# Patient Record
Sex: Male | Born: 1960
Health system: Southern US, Community
[De-identification: ages and names within clinical notes are randomized; demographics above are authoritative.]

## PROBLEM LIST (undated history)

## (undated) DIAGNOSIS — I10 Essential (primary) hypertension: Secondary | ICD-10-CM

## (undated) DIAGNOSIS — E669 Obesity, unspecified: Secondary | ICD-10-CM

## (undated) DIAGNOSIS — E7800 Pure hypercholesterolemia, unspecified: Secondary | ICD-10-CM

## (undated) DIAGNOSIS — I491 Atrial premature depolarization: Secondary | ICD-10-CM

## (undated) DIAGNOSIS — I4819 Other persistent atrial fibrillation: Secondary | ICD-10-CM

## (undated) HISTORY — DX: Other persistent atrial fibrillation: I48.19

## (undated) HISTORY — DX: Obesity, unspecified: E66.9

## (undated) HISTORY — DX: Atrial premature depolarization: I49.1

## (undated) HISTORY — PX: TONSILLECTOMY: SUR1361

## (undated) HISTORY — DX: Pure hypercholesterolemia, unspecified: E78.00

## (undated) HISTORY — DX: Essential (primary) hypertension: I10

---

## 1998-11-17 HISTORY — PX: SHOULDER ARTHROSCOPY: SHX128

## 2003-09-20 ENCOUNTER — Ambulatory Visit (HOSPITAL_COMMUNITY): Admission: RE | Admit: 2003-09-20 | Discharge: 2003-09-20 | Payer: Self-pay | Admitting: Orthopedic Surgery

## 2005-01-10 ENCOUNTER — Encounter: Admission: RE | Admit: 2005-01-10 | Discharge: 2005-01-31 | Payer: Self-pay | Admitting: Orthopedic Surgery

## 2005-03-05 ENCOUNTER — Inpatient Hospital Stay (HOSPITAL_COMMUNITY): Admission: EM | Admit: 2005-03-05 | Discharge: 2005-03-06 | Payer: Self-pay | Admitting: *Deleted

## 2007-02-05 ENCOUNTER — Emergency Department (HOSPITAL_COMMUNITY): Admission: EM | Admit: 2007-02-05 | Discharge: 2007-02-05 | Payer: Self-pay | Admitting: Family Medicine

## 2007-12-18 ENCOUNTER — Emergency Department (HOSPITAL_COMMUNITY): Admission: EM | Admit: 2007-12-18 | Discharge: 2007-12-18 | Payer: Self-pay | Admitting: Emergency Medicine

## 2008-04-04 ENCOUNTER — Encounter: Admission: RE | Admit: 2008-04-04 | Discharge: 2008-04-04 | Payer: Self-pay | Admitting: Chiropractic Medicine

## 2009-04-09 ENCOUNTER — Emergency Department (HOSPITAL_COMMUNITY): Admission: EM | Admit: 2009-04-09 | Discharge: 2009-04-09 | Payer: Self-pay | Admitting: Family Medicine

## 2010-11-17 HISTORY — PX: SHOULDER ARTHROSCOPY W/ ROTATOR CUFF REPAIR: SHX2400

## 2011-01-02 ENCOUNTER — Other Ambulatory Visit (HOSPITAL_COMMUNITY): Payer: Self-pay | Admitting: Orthopedic Surgery

## 2011-01-02 DIAGNOSIS — M75100 Unspecified rotator cuff tear or rupture of unspecified shoulder, not specified as traumatic: Secondary | ICD-10-CM

## 2011-01-02 DIAGNOSIS — M25512 Pain in left shoulder: Secondary | ICD-10-CM

## 2011-01-05 ENCOUNTER — Encounter (HOSPITAL_COMMUNITY): Payer: Self-pay

## 2011-01-05 ENCOUNTER — Ambulatory Visit (HOSPITAL_COMMUNITY)
Admission: RE | Admit: 2011-01-05 | Discharge: 2011-01-05 | Disposition: A | Discharge status: Home / Self Care | Payer: 59 | Source: Ambulatory Visit | Attending: Orthopedic Surgery | Admitting: Orthopedic Surgery

## 2011-01-05 DIAGNOSIS — M75100 Unspecified rotator cuff tear or rupture of unspecified shoulder, not specified as traumatic: Secondary | ICD-10-CM

## 2011-01-05 DIAGNOSIS — S46919A Strain of unspecified muscle, fascia and tendon at shoulder and upper arm level, unspecified arm, initial encounter: Secondary | ICD-10-CM | POA: Insufficient documentation

## 2011-01-05 DIAGNOSIS — M25512 Pain in left shoulder: Secondary | ICD-10-CM

## 2011-01-05 DIAGNOSIS — S46819A Strain of other muscles, fascia and tendons at shoulder and upper arm level, unspecified arm, initial encounter: Secondary | ICD-10-CM | POA: Insufficient documentation

## 2011-01-05 DIAGNOSIS — M25419 Effusion, unspecified shoulder: Secondary | ICD-10-CM | POA: Insufficient documentation

## 2011-01-05 DIAGNOSIS — X58XXXA Exposure to other specified factors, initial encounter: Secondary | ICD-10-CM | POA: Insufficient documentation

## 2011-01-08 ENCOUNTER — Other Ambulatory Visit (HOSPITAL_COMMUNITY): Payer: Self-pay | Admitting: Orthopedic Surgery

## 2011-01-08 ENCOUNTER — Ambulatory Visit (HOSPITAL_COMMUNITY)
Admission: RE | Admit: 2011-01-08 | Discharge: 2011-01-08 | Disposition: A | Discharge status: Home / Self Care | Payer: 59 | Source: Ambulatory Visit | Attending: Orthopedic Surgery | Admitting: Orthopedic Surgery

## 2011-01-08 ENCOUNTER — Encounter (HOSPITAL_COMMUNITY)
Admission: RE | Admit: 2011-01-08 | Discharge: 2011-01-08 | Disposition: A | Discharge status: Home / Self Care | Payer: 59 | Source: Ambulatory Visit | Attending: Orthopedic Surgery | Admitting: Orthopedic Surgery

## 2011-01-08 DIAGNOSIS — M25512 Pain in left shoulder: Secondary | ICD-10-CM

## 2011-01-08 DIAGNOSIS — Z01812 Encounter for preprocedural laboratory examination: Secondary | ICD-10-CM | POA: Insufficient documentation

## 2011-01-08 DIAGNOSIS — M25519 Pain in unspecified shoulder: Secondary | ICD-10-CM | POA: Insufficient documentation

## 2011-01-08 DIAGNOSIS — Z01818 Encounter for other preprocedural examination: Secondary | ICD-10-CM | POA: Insufficient documentation

## 2011-01-08 LAB — CBC
HCT: 45.3 % (ref 39.0–52.0)
Hemoglobin: 15.7 g/dL (ref 13.0–17.0)
MCH: 31.3 pg (ref 26.0–34.0)
MCHC: 34.7 g/dL (ref 30.0–36.0)
MCV: 90.2 fL (ref 78.0–100.0)
Platelets: 224 10*3/uL (ref 150–400)
RBC: 5.02 MIL/uL (ref 4.22–5.81)
RDW: 13.5 % (ref 11.5–15.5)
WBC: 5.3 10*3/uL (ref 4.0–10.5)

## 2011-01-08 LAB — COMPREHENSIVE METABOLIC PANEL
ALT: 31 U/L (ref 0–53)
AST: 25 U/L (ref 0–37)
Albumin: 4.2 g/dL (ref 3.5–5.2)
Alkaline Phosphatase: 39 U/L (ref 39–117)
BUN: 12 mg/dL (ref 6–23)
CO2: 27 mEq/L (ref 19–32)
Calcium: 9.4 mg/dL (ref 8.4–10.5)
Chloride: 102 mEq/L (ref 96–112)
Creatinine, Ser: 1.01 mg/dL (ref 0.4–1.5)
GFR calc Af Amer: >60 mL/min (ref >60)
GFR calc non Af Amer: >60 mL/min (ref >60)
Glucose, Bld: 107 mg/dL — ABNORMAL HIGH (ref 70–99)
Potassium: 4.7 mEq/L (ref 3.5–5.1)
Sodium: 138 mEq/L (ref 135–145)
Total Bilirubin: 0.7 mg/dL (ref 0.3–1.2)
Total Protein: 7.1 g/dL (ref 6.0–8.3)

## 2011-01-08 LAB — SURGICAL PCR SCREEN
MRSA, PCR: POSITIVE — AB
Staphylococcus aureus: POSITIVE — AB

## 2011-01-08 LAB — DIFFERENTIAL
Basophils Absolute: 0 10*3/uL (ref 0.0–0.1)
Basophils Relative: 1 % (ref 0–1)
Eosinophils Absolute: 0.2 10*3/uL (ref 0.0–0.7)
Eosinophils Relative: 4 % (ref 0–5)
Lymphocytes Relative: 28 % (ref 12–46)
Lymphs Abs: 1.5 10*3/uL (ref 0.7–4.0)
Monocytes Absolute: 0.5 10*3/uL (ref 0.1–1.0)
Monocytes Relative: 9 % (ref 3–12)
Neutro Abs: 3.1 10*3/uL (ref 1.7–7.7)
Neutrophils Relative %: 58 % (ref 43–77)

## 2011-01-08 LAB — URINALYSIS, ROUTINE W REFLEX MICROSCOPIC
Bilirubin Urine: NEGATIVE
Hgb urine dipstick: NEGATIVE
Ketones, ur: NEGATIVE mg/dL
Nitrite: NEGATIVE
Protein, ur: NEGATIVE mg/dL
Specific Gravity, Urine: 1.021 (ref 1.005–1.030)
Urine Glucose, Fasting: NEGATIVE mg/dL
Urobilinogen, UA: 0.2 mg/dL (ref 0.0–1.0)
pH: 8 (ref 5.0–8.0)

## 2011-01-08 LAB — PROTIME-INR
INR: 0.94 (ref 0.00–1.49)
Prothrombin Time: 12.8 seconds (ref 11.6–15.2)

## 2011-01-08 LAB — APTT: aPTT: 30 seconds (ref 24–37)

## 2011-01-10 ENCOUNTER — Ambulatory Visit (HOSPITAL_COMMUNITY)
Admission: RE | Admit: 2011-01-10 | Discharge: 2011-01-10 | Disposition: A | Discharge status: Home / Self Care | Payer: 59 | Source: Ambulatory Visit | Attending: Orthopedic Surgery | Admitting: Orthopedic Surgery

## 2011-01-10 DIAGNOSIS — Z7982 Long term (current) use of aspirin: Secondary | ICD-10-CM | POA: Insufficient documentation

## 2011-01-10 DIAGNOSIS — I4891 Unspecified atrial fibrillation: Secondary | ICD-10-CM | POA: Insufficient documentation

## 2011-01-10 DIAGNOSIS — S43429A Sprain of unspecified rotator cuff capsule, initial encounter: Secondary | ICD-10-CM | POA: Insufficient documentation

## 2011-01-10 DIAGNOSIS — M129 Arthropathy, unspecified: Secondary | ICD-10-CM | POA: Insufficient documentation

## 2011-01-10 DIAGNOSIS — X58XXXA Exposure to other specified factors, initial encounter: Secondary | ICD-10-CM | POA: Insufficient documentation

## 2011-01-20 NOTE — Op Note (Signed)
  NAME:  Terry Kim, READ NO.:  [id number]  MEDICAL RECORD NO.:  [id number]           PATIENT TYPE:  O  LOCATION:  [facility]SC                         FACILITY:  [facility]  PHYSICIAN:  Dyke Brackett, M.D.    DATE OF BIRTH:  December 14, 1960  DATE OF PROCEDURE:  01/10/2011 DATE OF DISCHARGE:                              OPERATIVE REPORT   INDICATIONS:  This is a 50 year old with MRI proven cuff tear, thought to be amenable to outpatient surgery.  PREOPERATIVE DIAGNOSES: 1. Complete rotator cuff tear supraspinatus. 2. A 30-40% biceps tendon tear, attachment of labrum. 3. Torn anterior superior labrum.  POSTOPERATIVE DIAGNOSES: 1. Complete rotator cuff tear supraspinatus. 2. A 30-40% biceps tendon tear, attachment of labrum. 3. Torn superior labrum.  OPERATION: 1. Open rotator cuff repair acromioplasty. 2. Open distal clavicle. 3. Arthroscopic debridement of biceps and torn labrum.  SURGEON:  Dyke Brackett, MD  ASSISTANT:  Laural Benes. Jannet Mantis.  All at the left shoulder.  On exam under anesthesia, there was no instability, normal range of motion passively.  Arthroscope was placed through anterior portals. Systematic inspection of the shoulder showed the patient to have tearing of the flaps of labrum which were debrided.  There was no instability noted.  There was a 30-40% attached tear right at the attachment of biceps tendon.  My feeling that this was an acute injury and that there was still probably 60-70% of the biceps attachment intact, we had elected to debride this.  There was a complete supraspinatus tear with mild retraction.  We identified that and decided to do an open procedure.  The procedure was made bisecting the [facility] acromial interval inclined under distal clavicle, moderate degenerative in nature which was excised over about a centimeter and centimeter and half.  Very thickened acromion within the acromioplasty revealing the cuff tear with fair amount  of bursal hypertrophy.  Bursectomy was carried out.  Edges of the tear were freshened with a 15 blade.  We brought the superior surface of the tuberosity.  I then placed two 5.5 Bio-Corkscrew anchors, a total of 4 sutures, oversewed this with #2 FiberWire.  This created essentially a watertight repair, good healthy tissue was obtained.  We oversewed the edge as mentioned, avoided base that we pulled off near, but not quite at the attachment site.  Thus, we reestablished the footprint nicely.  We then placed one PushLock laterally to imbricate the suture knots and imbricate more soft tissue on the lateral aspect of the tuberosity.  Copious irrigation was carried out.  Closure of the deltoid with #2 Ethibond, the subcutaneous tissues with 2-0 Vicryl, and the portals and skin with 3-0 Monocryl.  Light compressive sterile dressing, Benzoin and Steri-Strips applied and a sling, taken to the recovery room in stable condition.     Dyke Brackett, M.D.     WDC/MEDQ  D:  01/10/2011  T:  01/11/2011  Job:  [id number]  Electronically Signed by W. Shiloh Southern M.D. on 01/20/2011 01:48:57 PM

## 2011-04-04 NOTE — H&P (Signed)
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  According to his wife, he has not been feeling well recently.  He  has had a low-grade fever, mild frontal headache and pharyngitis.  Last  night, before he went to sleep, he took Benadryl and Mucinex in order to  help with his sinuses.  This morning, when he woke up, he generally did not  feel very good.  He had decreased appetite and still had a mild frontal  headache.  He sat on the side of the bed for awhile and then apparently  spontaneously lost consciousness and fell to the ground. His wife heard a  bang, presumably when he fell to the ground.  Before she could check on him,  he regained consciousness and called out to her.  She came into the room,  and at that point he was awake and alert.  She told him to sit down on the  side of the bed.  He did that, and then apparently looked rather pale and  diaphoretic and then fell forward again with loss of consciousness.  This  lasted for perhaps 45 seconds.  During this period of time, he had loud  snoring respirations.  There was no loss of bowel or bladder control.  There  was no seizure activity.  There was no apparent respiratory distress.  He  then woke up and seemed to be completely alert and awake after this  happened.  EMS was called, and the patient was transported to the emergency  room.  On arrival to the emergency room, his blood pressure was noted to be  107/73, pulse 77.  He was awake and alert, and his only complaint was the  above mentioned dull headache.  Electrocardiogram was obtained which  was  normal.  He is admitted at this time for further evaluation of these  recurrent syncopal episodes.  Of note is that in 2002, he was found to be in  atrial fibrillation.  EKG was done at [facility], and  he was put on metoprolol at that time.  An echocardiogram and stress test  were done by Dr. Armanda Kim and were reportedly normal.  He last saw Dr.  Mayford Knife in January who has been following him on a yearly basis.   MEDICATIONS:  1.  Metoprolol 50 mg daily.  2.  Lipitor 40 mg daily.  3.  Aspirin 81 mg daily.   ALLERGIES:  PENICILLIN.   OPERATIONS:  He has had a surgery on his right shoulder AC joint   PAST MEDICAL HISTORY:  1.  Hyperlipidemia.  2.  Mild hypertension.  3.  See above.   FAMILY HISTORY:  Father has history of allergies.  Mother has hypertension.  Siblings are in good health.   SOCIAL HISTORY:  He does not smoke, abuse alcohol or drugs.  He is married  to his wife who works at [employer].   REVIEW OF SYSTEMS:  Head:  He has a dull frontal headache.  Ear, Nose,  Throat:  He has mild pharyngitis.  Chest:  Denies coughing, wheezing or  chest congestion.  Cardiovascular:  Denies chest pain, orthopnea or PND.  GI:  Denies nausea, vomiting, abdominal pain, change in bowel habits, melena  or hematochezia.  GU:  Denies dysuria or urinary frequency.  Neurological:  No history of seizure or stroke.  Endocrine:  Denies excessive thirst,  urinary frequency or nocturia.   PHYSICAL EXAMINATION:  VITAL SIGNS:  Blood pressure 107/73, pulse 81,  respirations 20.  HEENT:  Within normal limits.  CHEST:  Clear.  CARDIOVASCULAR:  Normal S1, S2 without murmurs, rubs or gallops.  ABDOMEN:  Benign.  No bowel sounds without masses, tenderness or  organomegaly.  NEUROLOGICAL:  Cranial nerves, motor and sensory and cerebellar testing is  normal.  Station and gait were not tested.   STUDIES:  CT scan of the brain was obtained which was negative.  CT scan  of  the cervical spine was also done because of his head injury, and this showed  no acute changes.   LABORATORY DATA:  Unremarkable to date.   IMPRESSION:  1.  Recurrent syncope.  2.  History of headache.  3.  Hyperlipidemia.  4.  History of rapid atrial fibrillation.  5.  History of hypertension.   PLAN:  1.  Will admit him to telemetry.  2.  Per discussion with the family and at their request, will obtain an MRI      scan of the brain.  3.  I consulted Dr. Armanda Kim for further advise about whether additional      cardiac workup is indicated.  Dr. Mayford Knife is familiar with the patient,      having seen him in the past.      SY/MEDQ  D:  03/05/2005  T:  03/05/2005  Job:  [id number]   cc:   Terry Kim, M.D.  [address]  [place]  [place] [address]  Fax: [phone]   Terry Kim, M.D.  [address]  [place], [place] [address]  Fax: [phone]

## 2011-04-04 NOTE — Discharge Summary (Signed)
NAMEMAYCO, WALROND NO.:  [id number]   MEDICAL RECORD NO.:  [id number]          PATIENT TYPE:  INP   LOCATION:  2009                         FACILITY:  [facility]   PHYSICIAN:  Sherin Quarry, MD      DATE OF BIRTH:  [birth year]   DATE OF ADMISSION:  03/05/2005  DATE OF DISCHARGE:  03/06/2005                                 DISCHARGE SUMMARY   Terry Kim is a 50 year old man who presented on March 05, 2005 with a  history of recurrent syncope.  Essentially this gentleman had been  experiencing an illness characterized by low grade fever, malaise,  diaphoresis and pharyngitis over approximately 48 hours prior to  presentation.  On the morning of presentation he awakened and sat on the  side of the bed, he indicated he did not feel well and had a mild frontal  headache.  He then spontaneously passed out and fell on the floor.  His wife  heard the noise of him falling, but before she checked on him, he awakened  and once again sat on the side of the bed.  When she came in and saw him he  seemed pale, he then reported feeling dizzy and once again passed out  falling forward again, hitting his head.  His wife observed him to have  snoring respirations that lasted for about 45 seconds.  She did not witness  any seizure activity, there was no incontinence.  He did not appear to be  having any respiratory distress.  When he revived, he was completely alert.  He was therefore transported to the emergency room.  In the emergency room  his physical examination showed a blood pressure of 107/73, pulse 81,  respirations 20.  HEENT exam was within normal limits.  The chest was clear.  Cardiovascular exam revealed normal S1 and S2, without rubs, murmurs or  gallops.  The abdomen was normal.  On neurologic testing, cranial nerves,  motor, sensory and cerebellar testing was normal.  Station and gait was not  tested.  Relevant studies obtained included a CT scan done without contrast  which showed normal brain with no evidence of hemorrhage or stroke.  Because  of the patients history of recurrent head injury from falling, he also had a  CT scan of the cervical spine, this also showed no significant  abnormalities.  After discussion with the family a decision was made to  proceed with MRI scan of the brain, this was a completely normal study per  the radiologist.  As the patient had seen Dr. Armanda Magic in the past for  management of an episode of paroxysmal atrial fibrillation, I asked Dr.  Mayford Knife to reevaluate him.  It was Dr.  Norris Cross impression that this episode  of syncope was most likely secondary to dehydration and orthostatic  hypotension and probably did not represent a cardiac arrhythmia.  She  recommended that the patient undergo further testing to include a 2D  echocardiogram which she reviewed and indicated was a normal study.  She  also recommended carotid studies be performed.  These showed  no evidence of  plaque or ICA stenosis.  Vertebral artery flow was antegrade.  Laboratory  studies obtained included electrolytes, blood sugar, renal function and  urinalysis which were normal.  The patient was observed during the course of  this hospitalization on telemetry, and no arrhythmias were detected.  Orthostatic blood pressure was monitored and there was no abnormality  detected.  Therefore on March 06, 2005 the patient was discharged.   DISCHARGE DIAGNOSES:  1.  Syncope probably secondary to orthostatic hypotension related to      dehydration.  2.  Mild headache secondary to viral illness and trauma from blunt trauma to      head.  3.  History of hyperlipidemia.  4.  History of paroxysmal atrial fibrillation.  5.  History of hypertension.   On discharge the patient will be advised to continue his usual medications,  these consist of:  1.  Metoprolol 50 mg daily.  2.  Lipitor 40 mg daily.  3.  Aspirin 81 mg daily.   The patient will follow up with  Dr. Tenny Craw of the [facility] and with Dr. Armanda Magic of [facility].   CONDITION ON DISCHARGE:  Good.      SY/MEDQ  D:  03/06/2005  T:  03/06/2005  Job:  [id number]   cc:   Audree Camel, MD  [facility]   Armanda Magic, M.D.  [address]  [place], [place] [address]  Fax: [phone]

## 2012-04-15 ENCOUNTER — Ambulatory Visit: Payer: 59 | Attending: Family Medicine | Admitting: Physical Therapy

## 2012-04-15 DIAGNOSIS — M25659 Stiffness of unspecified hip, not elsewhere classified: Secondary | ICD-10-CM | POA: Insufficient documentation

## 2012-04-15 DIAGNOSIS — IMO0001 Reserved for inherently not codable concepts without codable children: Secondary | ICD-10-CM | POA: Insufficient documentation

## 2012-04-15 DIAGNOSIS — M25559 Pain in unspecified hip: Secondary | ICD-10-CM | POA: Insufficient documentation

## 2012-04-19 ENCOUNTER — Ambulatory Visit: Payer: 59 | Attending: Family Medicine

## 2012-04-19 DIAGNOSIS — M25659 Stiffness of unspecified hip, not elsewhere classified: Secondary | ICD-10-CM | POA: Insufficient documentation

## 2012-04-19 DIAGNOSIS — M25559 Pain in unspecified hip: Secondary | ICD-10-CM | POA: Insufficient documentation

## 2012-04-19 DIAGNOSIS — IMO0001 Reserved for inherently not codable concepts without codable children: Secondary | ICD-10-CM | POA: Insufficient documentation

## 2012-04-22 ENCOUNTER — Encounter: Payer: 59 | Admitting: Physical Therapy

## 2012-04-27 ENCOUNTER — Ambulatory Visit: Payer: 59 | Admitting: Physical Therapy

## 2012-04-30 ENCOUNTER — Ambulatory Visit: Payer: 59 | Admitting: Physical Therapy

## 2012-05-04 ENCOUNTER — Ambulatory Visit: Payer: 59 | Admitting: Physical Therapy

## 2012-05-11 ENCOUNTER — Encounter: Payer: 59 | Admitting: Physical Therapy

## 2012-05-25 ENCOUNTER — Ambulatory Visit: Payer: 59 | Attending: Family Medicine | Admitting: Physical Therapy

## 2012-05-25 DIAGNOSIS — M25559 Pain in unspecified hip: Secondary | ICD-10-CM | POA: Insufficient documentation

## 2012-05-25 DIAGNOSIS — IMO0001 Reserved for inherently not codable concepts without codable children: Secondary | ICD-10-CM | POA: Insufficient documentation

## 2012-05-25 DIAGNOSIS — M25659 Stiffness of unspecified hip, not elsewhere classified: Secondary | ICD-10-CM | POA: Insufficient documentation

## 2013-10-26 ENCOUNTER — Other Ambulatory Visit: Payer: Self-pay | Admitting: Cardiology

## 2014-04-03 ENCOUNTER — Other Ambulatory Visit: Payer: Self-pay | Admitting: General Surgery

## 2014-04-03 ENCOUNTER — Telehealth: Payer: Self-pay | Admitting: Cardiology

## 2014-04-03 ENCOUNTER — Encounter: Payer: Self-pay | Admitting: Cardiology

## 2014-04-03 DIAGNOSIS — E7800 Pure hypercholesterolemia, unspecified: Secondary | ICD-10-CM

## 2014-04-03 NOTE — Telephone Encounter (Signed)
Figured it out. Labs put in system.

## 2014-04-03 NOTE — Telephone Encounter (Signed)
What did he want for labs?

## 2014-04-03 NOTE — Telephone Encounter (Signed)
New Message  Made OV for labs per Pt requests. Please put in orders.

## 2014-04-19 ENCOUNTER — Ambulatory Visit: Payer: 59 | Admitting: Cardiology

## 2014-05-01 ENCOUNTER — Other Ambulatory Visit (INDEPENDENT_AMBULATORY_CARE_PROVIDER_SITE_OTHER): Payer: 59

## 2014-05-01 ENCOUNTER — Telehealth: Payer: Self-pay | Admitting: Cardiology

## 2014-05-01 ENCOUNTER — Other Ambulatory Visit: Payer: 59

## 2014-05-01 DIAGNOSIS — E7800 Pure hypercholesterolemia, unspecified: Secondary | ICD-10-CM

## 2014-05-01 LAB — LIPID PANEL
Cholesterol: 168 mg/dL (ref 0–200)
HDL: 45.2 mg/dL (ref >39.00)
LDL Cholesterol: 99 mg/dL (ref 0–99)
NonHDL: 122.8
Total CHOL/HDL Ratio: 4
Triglycerides: 117 mg/dL (ref 0.0–149.0)
VLDL: 23.4 mg/dL (ref 0.0–40.0)

## 2014-05-01 LAB — HEPATIC FUNCTION PANEL
ALT: 32 U/L (ref 0–53)
AST: 24 U/L (ref 0–37)
Albumin: 4.4 g/dL (ref 3.5–5.2)
Alkaline Phosphatase: 33 U/L — ABNORMAL LOW (ref 39–117)
Bilirubin, Direct: 0.1 mg/dL (ref 0.0–0.3)
Total Bilirubin: 0.8 mg/dL (ref 0.2–1.2)
Total Protein: 7.2 g/dL (ref 6.0–8.3)

## 2014-05-01 NOTE — Telephone Encounter (Signed)
Please review patient's lipids in epic

## 2014-05-01 NOTE — Telephone Encounter (Signed)
LDL goal < 130. Meds: Simvastatin 20 mg qhs, fish oil. Both LDL and non-HDL well controlled (LDL 99, non-HDL 123) LFTs okay. Plan: 1. No change in meds. 2. Recheck lipid panel and hepatic panel in 1 year. Please notify patient, and set up lab. thanks.

## 2014-05-02 ENCOUNTER — Other Ambulatory Visit: Payer: Self-pay | Admitting: General Surgery

## 2014-05-02 DIAGNOSIS — E7800 Pure hypercholesterolemia, unspecified: Secondary | ICD-10-CM

## 2014-05-02 NOTE — Telephone Encounter (Signed)
LEtter sent to pt with lab report as well to make aware. Labs ordered for one year out.

## 2014-05-15 ENCOUNTER — Ambulatory Visit: Payer: 59 | Admitting: Cardiology

## 2014-06-12 ENCOUNTER — Other Ambulatory Visit: Payer: Self-pay | Admitting: Cardiology

## 2014-06-13 NOTE — Telephone Encounter (Signed)
Pt must keep Appt in September in order to have any more refills. Has cancelled two appts since June

## 2014-06-19 ENCOUNTER — Other Ambulatory Visit (HOSPITAL_COMMUNITY): Payer: Self-pay | Admitting: Orthopedic Surgery

## 2014-06-19 DIAGNOSIS — M25562 Pain in left knee: Secondary | ICD-10-CM

## 2014-06-27 ENCOUNTER — Ambulatory Visit (HOSPITAL_COMMUNITY)
Admission: RE | Admit: 2014-06-27 | Discharge: 2014-06-27 | Disposition: A | Discharge status: Home / Self Care | Payer: 59 | Source: Ambulatory Visit | Attending: Orthopedic Surgery | Admitting: Orthopedic Surgery

## 2014-06-27 DIAGNOSIS — M25562 Pain in left knee: Secondary | ICD-10-CM

## 2014-06-27 DIAGNOSIS — M25569 Pain in unspecified knee: Secondary | ICD-10-CM | POA: Insufficient documentation

## 2014-06-30 ENCOUNTER — Encounter (HOSPITAL_BASED_OUTPATIENT_CLINIC_OR_DEPARTMENT_OTHER): Payer: Self-pay | Admitting: *Deleted

## 2014-06-30 NOTE — Progress Notes (Signed)
To come in for ekg-bmet-seeing dr turner 8/18 for clearance- Works IV team [facility]

## 2014-06-30 NOTE — Progress Notes (Signed)
06/30/14 1047  OBSTRUCTIVE SLEEP APNEA  Have you ever been diagnosed with sleep apnea through a sleep study? No  Do you snore loudly (loud enough to be heard through closed doors)?  1  Do you often feel tired, fatigued, or sleepy during the daytime? 0  Has anyone observed you stop breathing during your sleep? 0  Do you have, or are you being treated for high blood pressure? 1  BMI more than 35 kg/m2? 1  Age over 52 years old? 1  Neck circumference greater than 40 cm/16 inches? 1  Gender: 1  Obstructive Sleep Apnea Score 6  Score 4 or greater  Results sent to PCP

## 2014-07-03 ENCOUNTER — Encounter (HOSPITAL_BASED_OUTPATIENT_CLINIC_OR_DEPARTMENT_OTHER)
Admission: RE | Admit: 2014-07-03 | Discharge: 2014-07-03 | Disposition: A | Discharge status: Home / Self Care | Payer: 59 | Source: Ambulatory Visit | Attending: Orthopedic Surgery | Admitting: Orthopedic Surgery

## 2014-07-03 DIAGNOSIS — Z79899 Other long term (current) drug therapy: Secondary | ICD-10-CM | POA: Diagnosis not present

## 2014-07-03 DIAGNOSIS — I10 Essential (primary) hypertension: Secondary | ICD-10-CM | POA: Diagnosis not present

## 2014-07-03 DIAGNOSIS — E7800 Pure hypercholesterolemia, unspecified: Secondary | ICD-10-CM | POA: Diagnosis not present

## 2014-07-03 DIAGNOSIS — I4891 Unspecified atrial fibrillation: Secondary | ICD-10-CM | POA: Diagnosis not present

## 2014-07-03 DIAGNOSIS — M1710 Unilateral primary osteoarthritis, unspecified knee: Secondary | ICD-10-CM | POA: Diagnosis not present

## 2014-07-03 DIAGNOSIS — Z7982 Long term (current) use of aspirin: Secondary | ICD-10-CM | POA: Diagnosis not present

## 2014-07-03 DIAGNOSIS — IMO0002 Reserved for concepts with insufficient information to code with codable children: Secondary | ICD-10-CM | POA: Diagnosis present

## 2014-07-03 DIAGNOSIS — X58XXXA Exposure to other specified factors, initial encounter: Secondary | ICD-10-CM | POA: Diagnosis not present

## 2014-07-03 LAB — BASIC METABOLIC PANEL
ANION GAP: 11 (ref 5–15)
BUN: 13 mg/dL (ref 6–23)
CALCIUM: 9.4 mg/dL (ref 8.4–10.5)
CHLORIDE: 103 meq/L (ref 96–112)
CO2: 28 mEq/L (ref 19–32)
CREATININE: 0.9 mg/dL (ref 0.50–1.35)
GFR calc non Af Amer: >90 mL/min (ref >90)
Glucose, Bld: 122 mg/dL — ABNORMAL HIGH (ref 70–99)
Potassium: 4.2 mEq/L (ref 3.7–5.3)
Sodium: 142 mEq/L (ref 137–147)

## 2014-07-04 ENCOUNTER — Ambulatory Visit (INDEPENDENT_AMBULATORY_CARE_PROVIDER_SITE_OTHER): Payer: 59 | Admitting: Cardiology

## 2014-07-04 ENCOUNTER — Encounter: Payer: Self-pay | Admitting: Cardiology

## 2014-07-04 VITALS — BP 146/95 | HR 84 | Ht 66.0 in | Wt 235.0 lb

## 2014-07-04 DIAGNOSIS — E7800 Pure hypercholesterolemia, unspecified: Secondary | ICD-10-CM

## 2014-07-04 DIAGNOSIS — I10 Essential (primary) hypertension: Secondary | ICD-10-CM | POA: Insufficient documentation

## 2014-07-04 DIAGNOSIS — I4891 Unspecified atrial fibrillation: Secondary | ICD-10-CM

## 2014-07-04 DIAGNOSIS — I480 Paroxysmal atrial fibrillation: Secondary | ICD-10-CM

## 2014-07-04 DIAGNOSIS — E669 Obesity, unspecified: Secondary | ICD-10-CM

## 2014-07-04 DIAGNOSIS — I4819 Other persistent atrial fibrillation: Secondary | ICD-10-CM | POA: Insufficient documentation

## 2014-07-04 NOTE — Patient Instructions (Signed)
Your physician recommends that you continue on your current medications as directed. Please refer to the Current Medication list given to you today.  Dr Radford Pax has Cleared you for Surgery tomorrow 07/05/14  Your physician wants you to follow-up in: 1 year with Dr Mallie Snooks will receive a reminder letter in the mail two months in advance. If you don't receive a letter, please call our office to schedule the follow-up appointment.

## 2014-07-04 NOTE — Progress Notes (Signed)
  [address], [place], [place]  [address] Phone: [phone] Fax:  [phone]  Date:  07/04/2014   ID:  Terry Kim, DOB 1961/02/09, MRN [id number]  PCP:  Melinda Crutch  Cardiologist:  Fransico Him, MD     History of Present Illness: Terry Kim is a 53 y.o. male with a history of PAF, HTN, dyslipidemia and obesity who presents today for followup.  He is doing well.  He denies any chest pain, SOB, DOE, LE edema, dizziness, palpitations or syncope. He recently injured his knee and his going to have arthroscopic knee surgery and needs preop clearance.     Wt Readings from Last 3 Encounters:  07/04/14 235 lb (106.595 kg)  06/30/14 230 lb (104.327 kg)     Past Medical History  Diagnosis Date  . Obesity   . Paroxysmal a-fib   . Supraventricular premature beats   . Pure hypercholesterolemia   . Hypertension     Current Outpatient Prescriptions  Medication Sig Dispense Refill  . metoprolol succinate (TOPROL-XL) 50 MG 24 hr tablet Take 75 mg by mouth daily. Take with or immediately following a meal.      . Multiple Vitamin (MULTI VITAMIN DAILY PO) Take by mouth.      . simvastatin (ZOCOR) 20 MG tablet TAKE 1 TABLET BY MOUTH DAILY  30 tablet  0  . aspirin 325 MG tablet Take 325 mg by mouth daily.      Marland Kitchen ibuprofen (ADVIL,MOTRIN) 200 MG tablet Take 200 mg by mouth every 6 (six) hours as needed.       No current facility-administered medications for this visit.    Allergies:    Allergies  Allergen Reactions  . Penicillins Hives  . Skelaxin [Metaxalone] Itching    Social History:  The patient  reports that he has never smoked. He does not have any smokeless tobacco history on file. He reports that he drinks alcohol. He reports that he does not use illicit drugs.   Family History:  The patient's family history is not on file.   ROS:  Please see the history of present illness.      All other systems reviewed and negative.   PHYSICAL EXAM: VS:  BP 146/95  Pulse  84  Ht 5\' 6"  (1.676 m)  Wt 235 lb (106.595 kg)  BMI 37.95 kg/m2 Well nourished, well developed, in no acute distress HEENT: normal Neck: no JVD Cardiac:  normal S1, S2; RRR; no murmur Lungs:  clear to auscultation bilaterally, no wheezing, rhonchi or rales Abd: soft, nontender, no hepatomegaly Ext: no edema Skin: warm and dry Neuro:  CNs 2-12 intact, no focal abnormalities noted  EKG:     NSR with no ST changes  ASSESSMENT AND PLAN:  1. PAF maintaining NSR - continue ASA/BB 2. HTN slightly elevated today but at home is usually 115/80-mmHg - continue Toprol 3. Obesity 4. Dyslipidemia - LDL at goal at 99 - continue statin  Followup with me in 1 year  Signed, Fransico Him, MD 07/04/2014 8:49 AM

## 2014-07-05 ENCOUNTER — Ambulatory Visit: Payer: Self-pay | Admitting: Physician Assistant

## 2014-07-05 ENCOUNTER — Ambulatory Visit (HOSPITAL_BASED_OUTPATIENT_CLINIC_OR_DEPARTMENT_OTHER)
Admission: RE | Admit: 2014-07-05 | Discharge: 2014-07-05 | Disposition: A | Discharge status: Home / Self Care | Payer: 59 | Source: Ambulatory Visit | Attending: Orthopedic Surgery | Admitting: Orthopedic Surgery

## 2014-07-05 ENCOUNTER — Ambulatory Visit (HOSPITAL_BASED_OUTPATIENT_CLINIC_OR_DEPARTMENT_OTHER): Payer: 59 | Admitting: Certified Registered"

## 2014-07-05 ENCOUNTER — Telehealth: Payer: Self-pay | Admitting: Cardiology

## 2014-07-05 ENCOUNTER — Encounter (HOSPITAL_BASED_OUTPATIENT_CLINIC_OR_DEPARTMENT_OTHER)
Admission: RE | Disposition: A | Discharge status: Home / Self Care | Payer: Self-pay | Source: Ambulatory Visit | Attending: Orthopedic Surgery

## 2014-07-05 ENCOUNTER — Encounter (HOSPITAL_BASED_OUTPATIENT_CLINIC_OR_DEPARTMENT_OTHER): Payer: Self-pay | Admitting: *Deleted

## 2014-07-05 ENCOUNTER — Encounter (HOSPITAL_BASED_OUTPATIENT_CLINIC_OR_DEPARTMENT_OTHER): Payer: 59 | Admitting: Certified Registered"

## 2014-07-05 ENCOUNTER — Ambulatory Visit (HOSPITAL_COMMUNITY): Payer: 59

## 2014-07-05 DIAGNOSIS — E7800 Pure hypercholesterolemia, unspecified: Secondary | ICD-10-CM | POA: Insufficient documentation

## 2014-07-05 DIAGNOSIS — IMO0002 Reserved for concepts with insufficient information to code with codable children: Secondary | ICD-10-CM | POA: Insufficient documentation

## 2014-07-05 DIAGNOSIS — I10 Essential (primary) hypertension: Secondary | ICD-10-CM | POA: Insufficient documentation

## 2014-07-05 DIAGNOSIS — Z79899 Other long term (current) drug therapy: Secondary | ICD-10-CM | POA: Insufficient documentation

## 2014-07-05 DIAGNOSIS — Z7982 Long term (current) use of aspirin: Secondary | ICD-10-CM | POA: Insufficient documentation

## 2014-07-05 DIAGNOSIS — I4891 Unspecified atrial fibrillation: Secondary | ICD-10-CM | POA: Insufficient documentation

## 2014-07-05 DIAGNOSIS — M1710 Unilateral primary osteoarthritis, unspecified knee: Secondary | ICD-10-CM | POA: Insufficient documentation

## 2014-07-05 DIAGNOSIS — X58XXXA Exposure to other specified factors, initial encounter: Secondary | ICD-10-CM | POA: Insufficient documentation

## 2014-07-05 HISTORY — PX: KNEE ARTHROSCOPY WITH MEDIAL MENISECTOMY: SHX5651

## 2014-07-05 SURGERY — ARTHROSCOPY, KNEE, WITH MEDIAL MENISCECTOMY
Anesthesia: General | Site: Knee | Laterality: Left

## 2014-07-05 MED ORDER — HYDROMORPHONE HCL PF 1 MG/ML IJ SOLN: 0.5000 mg | INTRAMUSCULAR | Status: DC | PRN

## 2014-07-05 MED ORDER — FENTANYL CITRATE 0.05 MG/ML IJ SOLN
INTRAMUSCULAR | Status: AC
  Filled 2014-07-05: qty 6

## 2014-07-05 MED ORDER — HYDROCODONE-ACETAMINOPHEN 7.5-325 MG PO TABS: 1.0000 | ORAL_TABLET | ORAL | Status: DC | PRN

## 2014-07-05 MED ORDER — SODIUM CHLORIDE 0.9 % IV SOLN
INTRAVENOUS | Status: DC
Start: 2014-07-05 — End: 2014-07-05

## 2014-07-05 MED ORDER — CLINDAMYCIN PHOSPHATE 900 MG/50ML IV SOLN
900.0000 mg | INTRAVENOUS | Status: AC
  Administered 2014-07-05: 900 mg via INTRAVENOUS

## 2014-07-05 MED ORDER — FENTANYL CITRATE 0.05 MG/ML IJ SOLN: 50.0000 ug | INTRAMUSCULAR | Status: DC | PRN

## 2014-07-05 MED ORDER — MIDAZOLAM HCL 2 MG/2ML IJ SOLN: 1.0000 mg | INTRAMUSCULAR | Status: DC | PRN

## 2014-07-05 MED ORDER — OXYCODONE HCL 5 MG PO TABS: 5.0000 mg | ORAL_TABLET | Freq: Once | ORAL | Status: DC | PRN

## 2014-07-05 MED ORDER — BUPIVACAINE-EPINEPHRINE 0.5% -1:200000 IJ SOLN
INTRAMUSCULAR | Status: DC | PRN
  Administered 2014-07-05: 30 mL

## 2014-07-05 MED ORDER — HYDROMORPHONE HCL PF 1 MG/ML IJ SOLN
INTRAMUSCULAR | Status: AC
  Filled 2014-07-05: qty 1

## 2014-07-05 MED ORDER — LACTATED RINGERS IV SOLN
INTRAVENOUS | Status: DC
  Administered 2014-07-05 (×2): via INTRAVENOUS

## 2014-07-05 MED ORDER — DEXAMETHASONE SODIUM PHOSPHATE 4 MG/ML IJ SOLN
INTRAMUSCULAR | Status: DC | PRN
  Administered 2014-07-05: 10 mg via INTRAVENOUS

## 2014-07-05 MED ORDER — SODIUM CHLORIDE 0.9 % IR SOLN
Status: DC | PRN
  Administered 2014-07-05: 6000 mL

## 2014-07-05 MED ORDER — SODIUM CHLORIDE 0.9 % IV SOLN: INTRAVENOUS | Status: DC

## 2014-07-05 MED ORDER — CLINDAMYCIN PHOSPHATE 900 MG/50ML IV SOLN
INTRAVENOUS | Status: AC
  Filled 2014-07-05: qty 50

## 2014-07-05 MED ORDER — LIDOCAINE HCL (CARDIAC) 20 MG/ML IV SOLN
INTRAVENOUS | Status: DC | PRN
  Administered 2014-07-05: 50 mg via INTRAVENOUS

## 2014-07-05 MED ORDER — KETOROLAC TROMETHAMINE 30 MG/ML IJ SOLN
INTRAMUSCULAR | Status: DC | PRN
  Administered 2014-07-05: 30 mg via INTRAVENOUS

## 2014-07-05 MED ORDER — CHLORHEXIDINE GLUCONATE 4 % EX LIQD: 60.0000 mL | Freq: Once | CUTANEOUS | Status: DC

## 2014-07-05 MED ORDER — HYDROMORPHONE HCL PF 1 MG/ML IJ SOLN
0.2500 mg | INTRAMUSCULAR | Status: DC | PRN
  Administered 2014-07-05 (×2): 0.5 mg via INTRAVENOUS

## 2014-07-05 MED ORDER — METOCLOPRAMIDE HCL 5 MG/ML IJ SOLN
5.0000 mg | Freq: Three times a day (TID) | INTRAMUSCULAR | Status: DC | PRN
Start: 2014-07-05 — End: 2014-07-05

## 2014-07-05 MED ORDER — ONDANSETRON HCL 4 MG/2ML IJ SOLN
4.0000 mg | Freq: Four times a day (QID) | INTRAMUSCULAR | Status: DC | PRN
Start: 2014-07-05 — End: 2014-07-05

## 2014-07-05 MED ORDER — FENTANYL CITRATE 0.05 MG/ML IJ SOLN
INTRAMUSCULAR | Status: DC | PRN
  Administered 2014-07-05: 100 ug via INTRAVENOUS

## 2014-07-05 MED ORDER — MIDAZOLAM HCL 2 MG/2ML IJ SOLN
INTRAMUSCULAR | Status: AC
  Filled 2014-07-05: qty 2

## 2014-07-05 MED ORDER — ONDANSETRON HCL 4 MG PO TABS
4.0000 mg | ORAL_TABLET | Freq: Four times a day (QID) | ORAL | Status: DC | PRN
Start: 2014-07-05 — End: 2014-07-05

## 2014-07-05 MED ORDER — ONDANSETRON HCL 4 MG/2ML IJ SOLN
INTRAMUSCULAR | Status: DC | PRN
  Administered 2014-07-05: 4 mg via INTRAVENOUS

## 2014-07-05 MED ORDER — METHYLPREDNISOLONE ACETATE 80 MG/ML IJ SUSP
INTRAMUSCULAR | Status: DC | PRN
  Administered 2014-07-05: 80 mg via INTRA_ARTICULAR

## 2014-07-05 MED ORDER — MIDAZOLAM HCL 5 MG/5ML IJ SOLN
INTRAMUSCULAR | Status: DC | PRN
  Administered 2014-07-05: 2 mg via INTRAVENOUS

## 2014-07-05 MED ORDER — OXYCODONE HCL 5 MG/5ML PO SOLN: 5.0000 mg | Freq: Once | ORAL | Status: DC | PRN

## 2014-07-05 MED ORDER — METOCLOPRAMIDE HCL 5 MG PO TABS: 5.0000 mg | ORAL_TABLET | Freq: Three times a day (TID) | ORAL | Status: DC | PRN

## 2014-07-05 MED ORDER — PROPOFOL 10 MG/ML IV BOLUS
INTRAVENOUS | Status: DC | PRN
  Administered 2014-07-05: 200 mg via INTRAVENOUS

## 2014-07-05 SURGICAL SUPPLY — 45 items
BANDAGE ELASTIC 6 VELCRO ST LF (GAUZE/BANDAGES/DRESSINGS) IMPLANT
BANDAGE ESMARK 6X9 LF (GAUZE/BANDAGES/DRESSINGS) IMPLANT
BLADE 4.2CUDA (BLADE) IMPLANT
BLADE CUDA 5.5 (BLADE) IMPLANT
BLADE CUDA GRT WHITE 3.5 (BLADE) ×3 IMPLANT
BLADE CUDA SHAVER 3.5 (BLADE) IMPLANT
BLADE CUTTER MENIS 5.5 (BLADE) IMPLANT
BLADE GREAT WHITE 4.2 (BLADE) IMPLANT
BLADE GREAT WHITE 4.2MM (BLADE)
BNDG CMPR 9X6 STRL LF SNTH (GAUZE/BANDAGES/DRESSINGS)
BNDG ESMARK 6X9 LF (GAUZE/BANDAGES/DRESSINGS)
BNDG GAUZE ELAST 4 BULKY (GAUZE/BANDAGES/DRESSINGS) ×3 IMPLANT
BRUSH SCRUB EZ PLAIN DRY (MISCELLANEOUS) ×3 IMPLANT
CANISTER SUCT 3000ML (MISCELLANEOUS) IMPLANT
CUTTER MENISCUS  4.2MM (BLADE)
CUTTER MENISCUS 4.2MM (BLADE) IMPLANT
DRAPE ARTHROSCOPY W/POUCH 114 (DRAPES) ×3 IMPLANT
DRSG EMULSION OIL 3X3 NADH (GAUZE/BANDAGES/DRESSINGS) ×3 IMPLANT
DURAPREP 26ML APPLICATOR (WOUND CARE) ×3 IMPLANT
GAUZE SPONGE 4X4 12PLY STRL (GAUZE/BANDAGES/DRESSINGS) ×3 IMPLANT
GLOVE BIO SURGEON STRL SZ7.5 (GLOVE) ×3 IMPLANT
GLOVE BIOGEL PI IND STRL 8 (GLOVE) ×2 IMPLANT
GLOVE BIOGEL PI INDICATOR 8 (GLOVE) ×4
GLOVE SURG ORTHO 8.0 STRL STRW (GLOVE) ×3 IMPLANT
GLOVE SURG SS PI 7.0 STRL IVOR (GLOVE) ×3 IMPLANT
GOWN STRL REUS W/ TWL LRG LVL3 (GOWN DISPOSABLE) ×1 IMPLANT
GOWN STRL REUS W/ TWL XL LVL3 (GOWN DISPOSABLE) ×1 IMPLANT
GOWN STRL REUS W/TWL LRG LVL3 (GOWN DISPOSABLE) ×3
GOWN STRL REUS W/TWL XL LVL3 (GOWN DISPOSABLE) ×3
HOLDER KNEE FOAM BLUE (MISCELLANEOUS) ×3 IMPLANT
KNEE WRAP E Z 3 GEL PACK (MISCELLANEOUS) ×2 IMPLANT
MANIFOLD NEPTUNE II (INSTRUMENTS) ×2 IMPLANT
NDL SAFETY ECLIPSE 18X1.5 (NEEDLE) ×1 IMPLANT
NEEDLE HYPO 18GX1.5 SHARP (NEEDLE) ×6
PACK ARTHROSCOPY DSU (CUSTOM PROCEDURE TRAY) ×3 IMPLANT
PACK BASIN DAY SURGERY FS (CUSTOM PROCEDURE TRAY) ×3 IMPLANT
SET ARTHROSCOPY TUBING (MISCELLANEOUS) ×3
SET ARTHROSCOPY TUBING LN (MISCELLANEOUS) ×1 IMPLANT
SUT ETHILON 4 0 PS 2 18 (SUTURE) ×3 IMPLANT
SYR 5ML LL (SYRINGE) ×3 IMPLANT
TOWEL OR 17X24 6PK STRL BLUE (TOWEL DISPOSABLE) ×3 IMPLANT
WAND 3.0 CAPSURE 30 DEG W/CORD (SURGICAL WAND) IMPLANT
WAND 30 DEG SABER W/CORD (SURGICAL WAND) IMPLANT
WAND STAR VAC 90 (SURGICAL WAND) IMPLANT
WATER STERILE IRR 1000ML POUR (IV SOLUTION) ×3 IMPLANT

## 2014-07-05 NOTE — Op Note (Signed)
NAME:  KINGSTYN, DERUITER NO.:  [id number]  MEDICAL RECORD NO.:  [id number]  LOCATION:  [facility]                         FACILITY:  [facility]  PHYSICIAN:  Lockie Pares, M.D.    DATE OF BIRTH:  06-10-1961  DATE OF PROCEDURE: DATE OF DISCHARGE:  07/05/2014                              OPERATIVE REPORT   PREOPERATIVE DIAGNOSIS: 1. Left knee torn meniscus. 2. Extensive osteoarthritis with grade 3 chondromalacia.  POSTOPERATIVE DIAGNOSIS: 1. Left knee torn meniscus. 2. Extensive osteoarthritis with grade 3 chondromalacia.  OPERATION: 1. Partial medial meniscectomy. 2. Tricompartmental debridement chondroplasty.  SURGEON:  Lockie Pares, MD  DESCRIPTION:  Leg holder, general anesthetics, inferomedial inferolateral portals made.  Extensive chondromalacia of patellofemoral joint with advanced grade 3 close to grade 4 changes of patellofemoral joint debrided.  Thick medial shelf was resected as well.  The lateral meniscus was normal.  There was a longitudinal extensive fissure in the lateral plateau which was debrided.  The lateral femur was relatively spared of degenerative change extensive chondromalacia in the medial condyle was noted.  There was no full-thickness component.  We did a chondroplasty involved probably 50% of the weightbearing surface of the femur with relative sparing of the tibia complex tear of the posterior horn meniscus required probably 30-40% meniscectomy with leaving a stable rim.  Flap tear was at the junction of the middle posterior third.  Again extensive debridement was carried out as well as a partial medial meniscectomy.  Knee drained free of fluid.  Portal was closed with nylon.  We infiltrated the knee with 20 mL of 0.5% Marcaine with additional 80 mg of Depo-Medrol.  The distal 10 mL into the subcutaneous portals.  Lightly compressive sterile dressing applied, taken to recovery room in stable condition.     Lockie Pares,  M.D.     WDC/MEDQ  D:  07/05/2014  T:  07/05/2014  Job:  [id number]

## 2014-07-05 NOTE — Anesthesia Postprocedure Evaluation (Signed)
  Anesthesia Post-op Note  Patient: [name]  Procedure(s) Performed: Procedure(s): LEFT ARTHROSCOPY KNEE WITH MEDIAL MENISECTOMY/DEBRIDEMENT/SHAVING (CHONDROPLASTY)/ARTHROSCOPY KNEE ABRASION ARTHROPLASTY/MULTIPLE DRILLING (Left)  Patient Location: PACU  Anesthesia Type:General  Level of Consciousness: awake and alert   Airway and Oxygen Therapy: Patient Spontanous Breathing  Post-op Pain: mild  Post-op Assessment: Post-op Vital signs reviewed, Patient's Cardiovascular Status Stable and Respiratory Function Stable  Post-op Vital Signs: Reviewed  Filed Vitals:   07/05/14 1645  BP: 139/94  Pulse: 84  Temp:   Resp: 22    Complications: No apparent anesthesia complications

## 2014-07-05 NOTE — Telephone Encounter (Signed)
Received request from Nurse fax box, documents faxed for surgical clearance. To: [employer] Fax number: [phone] Attention: [phone]/km

## 2014-07-05 NOTE — Anesthesia Preprocedure Evaluation (Addendum)
Anesthesia Evaluation  Patient identified by MRN, date of birth, ID band Patient awake    Reviewed: Allergy & Precautions, H&P , NPO status , Patient's Chart, lab work & pertinent test results, reviewed documented beta blocker date and time   Airway Mallampati: III TM Distance: >3 FB Neck ROM: Full    Dental no notable dental hx. (+) Teeth Intact, Dental Advisory Given   Pulmonary neg pulmonary ROS,  breath sounds clear to auscultation  Pulmonary exam normal       Cardiovascular hypertension, Pt. on medications and Pt. on home beta blockers negative cardio ROS  + dysrhythmias Atrial Fibrillation Rhythm:Regular Rate:Normal     Neuro/Psych negative neurological ROS  negative psych ROS   GI/Hepatic negative GI ROS, Neg liver ROS,   Endo/Other  negative endocrine ROS  Renal/GU negative Renal ROS  negative genitourinary   Musculoskeletal   Abdominal   Peds  Hematology negative hematology ROS (+)   Anesthesia Other Findings   Reproductive/Obstetrics negative OB ROS                          Anesthesia Physical Anesthesia Plan  ASA: II  Anesthesia Plan: General   Post-op Pain Management:    Induction: Intravenous  Airway Management Planned: LMA  Additional Equipment:   Intra-op Plan:   Post-operative Plan: Extubation in OR  Informed Consent: I have reviewed the patients History and Physical, chart, labs and discussed the procedure including the risks, benefits and alternatives for the proposed anesthesia with the patient or authorized representative who has indicated his/her understanding and acceptance.   Dental advisory given  Plan Discussed with: CRNA  Anesthesia Plan Comments:         Anesthesia Quick Evaluation

## 2014-07-05 NOTE — Op Note (Deleted)
NAME:  Terry Kim, Terry Kim NO.:  [id number]  MEDICAL RECORD NO.:  [id number]  LOCATION:  [facility]                         FACILITY:  [facility]  PHYSICIAN:  Lockie Pares, M.D.    DATE OF BIRTH:  1960/12/20  DATE OF PROCEDURE: DATE OF DISCHARGE:  07/05/2014                              OPERATIVE REPORT   PREOPERATIVE DIAGNOSIS: 1. Left knee torn meniscus. 2. Extensive osteoarthritis with grade 3 chondromalacia.  POSTOPERATIVE DIAGNOSIS: 1. Left knee torn meniscus. 2. Extensive osteoarthritis with grade 3 chondromalacia.  OPERATION: 1. Partial medial meniscectomy. 2. Tricompartmental debridement chondroplasty.  SURGEON:  Lockie Pares, MD  DESCRIPTION:  Leg holder, general anesthetics, inferomedial inferolateral portals made.  Extensive chondromalacia of patellofemoral joint with advanced grade 3 close to grade 4 changes of patellofemoral joint debrided.  Thick medial shelf was resected as well.  The lateral meniscus was normal.  There was a longitudinal extensive fissure in the lateral plateau which was debrided.  The lateral femur was relatively spared of degenerative change extensive chondromalacia in the medial condyle was noted.  There was no full-thickness component.  We did a chondroplasty involved probably 50% of the weightbearing surface of the femur with relative sparing of the tibia complex tear of the posterior horn meniscus required probably 30-40% meniscectomy with leaving a stable rim.  Flap tear was at the junction of the middle posterior third.  Again extensive debridement was carried out as well as a partial medial meniscectomy.  Knee drained free of fluid.  Portal was closed with nylon.  We infiltrated the knee with 20 mL of 0.5% Marcaine with additional 80 mg of Depo-Medrol.  The distal 10 mL into the subcutaneous portals.  Lightly compressive sterile dressing applied, taken to recovery room in stable condition.     Lockie Pares,  M.D.     WDC/MEDQ  D:  07/05/2014  T:  07/05/2014  Job:  [id number]

## 2014-07-05 NOTE — Transfer of Care (Signed)
Immediate Anesthesia Transfer of Care Note  Patient: Terry Kim  Procedure(s) Performed: Procedure(s): LEFT ARTHROSCOPY KNEE WITH MEDIAL MENISECTOMY/DEBRIDEMENT/SHAVING (CHONDROPLASTY)/ARTHROSCOPY KNEE ABRASION ARTHROPLASTY/MULTIPLE DRILLING (Left)  Patient Location: PACU  Anesthesia Type:General  Level of Consciousness: awake, alert  and oriented  Airway & Oxygen Therapy: Patient Spontanous Breathing and Patient connected to face mask oxygen  Post-op Assessment: Report given to PACU RN and Post -op Vital signs reviewed and stable  Post vital signs: Reviewed and stable  Complications: No apparent anesthesia complications

## 2014-07-05 NOTE — Discharge Instructions (Signed)
Diet: As you were doing prior to hospitalization   Activity: Increase activity slowly as tolerated  No lifting or driving for 2 weeks   Shower: May shower without a dressing on post op day #2, NO SOAKING in tub   Dressing: You may change your dressing on post op day #2.  Then change the dressing daily with band aids.  Weight Bearing: touch down weight bearing for balance only. Use a walker or  Crutches as instructed.   To prevent constipation: you may use a stool softener such as -  Colace ( over the counter) 100 mg by mouth twice a day  Drink plenty of fluids ( prune juice may be helpful) and high fiber foods  Miralax ( over the counter) for constipation as needed.   Precautions: If you experience chest pain or shortness of breath - call 911 immediately For transfer to the hospital emergency department!!  If you develop a fever greater that 101 F, purulent drainage from wound, increased redness or drainage from wound, or calf pain -- Call the office   Follow- Up Appointment: Please call for an appointment to be seen in 1 weeks  [place] - [phone]  Call your surgeon if you experience:   1.  Fever over 101.0. 2.  Inability to urinate. 3.  Nausea and/or vomiting. 4.  Extreme swelling or bruising at the surgical site. 5.  Continued bleeding from the incision. 6.  Increased pain, redness or drainage from the incision. 7.  Problems related to your pain medication. 8. Any change in color, movement and/or sensation 9. Any problems and/or concerns   Post Anesthesia Home Care Instructions  Activity: Get plenty of rest for the remainder of the day. A responsible adult should stay with you for 24 hours following the procedure.  For the next 24 hours, DO NOT: -Drive a car -Paediatric nurse -Drink alcoholic beverages -Take any medication unless instructed by your physician -Make any legal decisions or sign important papers.  Meals: Start with liquid foods such as gelatin  or soup. Progress to regular foods as tolerated. Avoid greasy, spicy, heavy foods. If nausea and/or vomiting occur, drink only clear liquids until the nausea and/or vomiting subsides. Call your physician if vomiting continues.  Special Instructions/Symptoms: Your throat may feel dry or sore from the anesthesia or the breathing tube placed in your throat during surgery. If this causes discomfort, gargle with warm salt water. The discomfort should disappear within 24 hours.

## 2014-07-05 NOTE — H&P (Signed)
Terry Kim is an 53 y.o. male.   Chief Complaint: left knee medial meniscus tear chondral injury HPI: Terry Kim is a 53 year-old male who is known to the office with previous shoulder problems.  He has a chronic history of knee problems.  Describes an MRI 15 years ago and he had a symptomatic plica.  He has been treated conservatively.  He is very active.  He works out in [employer].  He does [place] and a lot of hiking.  End of July of this year he was working out on the elliptical machine when he states he "felt a tear over the medial knee".  He has had significant pain since that time.  Swelling.  He is on p.o. NSAIDs without relief.  He does describe mechanical symptoms.  He works as a Marine scientist at [employer].  He is working at this time.  He works 12 hour shifts.  We elected to obtain MRI which shows medial meniscus tear with chondral injury.  Past Medical History  Diagnosis Date  . Obesity   . Paroxysmal a-fib   . Supraventricular premature beats   . Pure hypercholesterolemia   . Hypertension     Past Surgical History  Procedure Laterality Date  . Shoulder arthroscopy w/ rotator cuff repair  2012    left  . Shoulder arthroscopy  2000    right  . Tonsillectomy      No family history on file. Social History:  reports that he has never smoked. He does not have any smokeless tobacco history on file. He reports that he drinks alcohol. He reports that he does not use illicit drugs.  Allergies:  Allergies  Allergen Reactions  . Penicillins Hives  . Skelaxin [Metaxalone] Itching     (Not in a hospital admission)  Results for orders placed during the hospital encounter of 07/05/14 (from the past 48 hour(s))  BASIC METABOLIC PANEL     Status: Abnormal   Collection Time    07/03/14 12:30 PM      Result Value Ref Range   Sodium 142  137 - 147 mEq/L   Potassium 4.2  3.7 - 5.3 mEq/L   Chloride 103  96 - 112 mEq/L   CO2 28  19 - 32 mEq/L   Glucose, Bld 122 (*) 70 - 99 mg/dL   BUN  13  6 - 23 mg/dL   Creatinine, Ser 0.90  0.50 - 1.35 mg/dL   Calcium 9.4  8.4 - 10.5 mg/dL   GFR calc non Af Amer >90  >90 mL/min   GFR calc Af Amer >90  >90 mL/min   Comment: (NOTE)     The eGFR has been calculated using the CKD EPI equation.     This calculation has not been validated in all clinical situations.     eGFR's persistently <90 mL/min signify possible Chronic Kidney     Disease.   Anion gap 11  5 - 15   No results found.  Review of Systems  Constitutional: Negative.   HENT: Negative.   Eyes: Negative.   Respiratory: Negative.   Cardiovascular: Negative.   Gastrointestinal: Negative.   Genitourinary: Negative.   Musculoskeletal: Positive for joint pain.  Skin: Negative.   Neurological: Negative.   Endo/Heme/Allergies: Negative.   Psychiatric/Behavioral: Negative.     There were no vitals taken for this visit. Physical Exam  Constitutional: He is oriented to person, place, and time. He appears well-developed and well-nourished. No distress.  HENT:  Head: Normocephalic and atraumatic.  Nose: Nose normal.  Eyes: Conjunctivae and EOM are normal. Pupils are equal, round, and reactive to light.  Neck: Normal range of motion. Neck supple.  Cardiovascular: Normal rate and intact distal pulses.   Respiratory: Effort normal. No respiratory distress. He has no wheezes.  GI: Soft. He exhibits no distension. There is no tenderness.  Musculoskeletal:       Left knee: He exhibits decreased range of motion and swelling. He exhibits no LCL laxity and no MCL laxity. Tenderness found. Medial joint line tenderness noted.  Neurological: He is alert and oriented to person, place, and time. No cranial nerve deficit.  Skin: Skin is warm and dry. No rash noted. No erythema.  Psychiatric: He has a normal mood and affect. His behavior is normal.     Assessment/Plan He has a left knee medial meniscus tear with chondral injury, there is some displacement of a fragment of the meniscus  combined with the chondral injury. I think this doesn't make surgery a reasonable consideration. There is a focal defect in the posterior aspect of his knee femoral condyle 0.5x0.7 that may need microfracture additionally meniscectomy. Prior exam showed some progression of osteoarthritis but nothing approaching severe. We went over MRI findings. 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 I would recommend arthroscopy based on those factors for meniscectomy debridement possible microfracture. Prescription given for Hydrocodone.  Terry Kim 07/05/2014, 7:07 AM

## 2014-07-06 ENCOUNTER — Encounter (HOSPITAL_BASED_OUTPATIENT_CLINIC_OR_DEPARTMENT_OTHER): Payer: Self-pay | Admitting: Orthopedic Surgery

## 2014-07-06 LAB — POCT HEMOGLOBIN-HEMACUE: Hemoglobin: 14.9 g/dL (ref 13.0–17.0)

## 2014-07-11 ENCOUNTER — Ambulatory Visit: Payer: 59 | Attending: Orthopedic Surgery | Admitting: Physical Therapy

## 2014-07-11 DIAGNOSIS — R5381 Other malaise: Secondary | ICD-10-CM | POA: Insufficient documentation

## 2014-07-11 DIAGNOSIS — Z9889 Other specified postprocedural states: Secondary | ICD-10-CM | POA: Diagnosis not present

## 2014-07-11 DIAGNOSIS — M25569 Pain in unspecified knee: Secondary | ICD-10-CM | POA: Diagnosis not present

## 2014-07-11 DIAGNOSIS — M25669 Stiffness of unspecified knee, not elsewhere classified: Secondary | ICD-10-CM | POA: Diagnosis not present

## 2014-07-11 DIAGNOSIS — I4891 Unspecified atrial fibrillation: Secondary | ICD-10-CM | POA: Diagnosis not present

## 2014-07-11 DIAGNOSIS — I10 Essential (primary) hypertension: Secondary | ICD-10-CM | POA: Diagnosis not present

## 2014-07-11 DIAGNOSIS — R609 Edema, unspecified: Secondary | ICD-10-CM | POA: Diagnosis not present

## 2014-07-11 DIAGNOSIS — IMO0001 Reserved for inherently not codable concepts without codable children: Secondary | ICD-10-CM | POA: Diagnosis not present

## 2014-07-12 ENCOUNTER — Ambulatory Visit: Payer: 59 | Admitting: Physical Therapy

## 2014-07-12 DIAGNOSIS — IMO0001 Reserved for inherently not codable concepts without codable children: Secondary | ICD-10-CM | POA: Diagnosis not present

## 2014-07-17 ENCOUNTER — Ambulatory Visit: Payer: 59 | Admitting: Physical Therapy

## 2014-07-17 DIAGNOSIS — IMO0001 Reserved for inherently not codable concepts without codable children: Secondary | ICD-10-CM | POA: Diagnosis not present

## 2014-07-18 ENCOUNTER — Ambulatory Visit: Payer: 59 | Admitting: Cardiology

## 2014-07-19 ENCOUNTER — Ambulatory Visit: Payer: 59 | Attending: Orthopedic Surgery | Admitting: Physical Therapy

## 2014-07-19 DIAGNOSIS — Z9889 Other specified postprocedural states: Secondary | ICD-10-CM | POA: Diagnosis not present

## 2014-07-19 DIAGNOSIS — R609 Edema, unspecified: Secondary | ICD-10-CM | POA: Insufficient documentation

## 2014-07-19 DIAGNOSIS — I10 Essential (primary) hypertension: Secondary | ICD-10-CM | POA: Insufficient documentation

## 2014-07-19 DIAGNOSIS — M25669 Stiffness of unspecified knee, not elsewhere classified: Secondary | ICD-10-CM | POA: Diagnosis not present

## 2014-07-19 DIAGNOSIS — M25569 Pain in unspecified knee: Secondary | ICD-10-CM | POA: Insufficient documentation

## 2014-07-19 DIAGNOSIS — IMO0001 Reserved for inherently not codable concepts without codable children: Secondary | ICD-10-CM | POA: Diagnosis present

## 2014-07-19 DIAGNOSIS — I4891 Unspecified atrial fibrillation: Secondary | ICD-10-CM | POA: Insufficient documentation

## 2014-07-19 DIAGNOSIS — R5381 Other malaise: Secondary | ICD-10-CM | POA: Diagnosis not present

## 2014-07-25 ENCOUNTER — Ambulatory Visit: Payer: 59

## 2014-07-25 DIAGNOSIS — IMO0001 Reserved for inherently not codable concepts without codable children: Secondary | ICD-10-CM | POA: Diagnosis not present

## 2014-07-26 ENCOUNTER — Ambulatory Visit: Payer: 59 | Admitting: Physical Therapy

## 2014-07-28 ENCOUNTER — Encounter: Payer: 59 | Admitting: Physical Therapy

## 2014-07-31 ENCOUNTER — Encounter: Payer: 59 | Admitting: Physical Therapy

## 2014-08-01 ENCOUNTER — Ambulatory Visit: Payer: 59 | Admitting: Physical Therapy

## 2014-08-01 DIAGNOSIS — IMO0001 Reserved for inherently not codable concepts without codable children: Secondary | ICD-10-CM | POA: Diagnosis not present

## 2014-08-02 ENCOUNTER — Encounter: Payer: 59 | Admitting: Physical Therapy

## 2014-08-03 ENCOUNTER — Encounter: Payer: 59 | Admitting: Physical Therapy

## 2014-08-09 ENCOUNTER — Ambulatory Visit: Payer: 59 | Admitting: Physical Therapy

## 2014-08-09 DIAGNOSIS — IMO0001 Reserved for inherently not codable concepts without codable children: Secondary | ICD-10-CM | POA: Diagnosis not present

## 2014-09-12 ENCOUNTER — Other Ambulatory Visit: Payer: Self-pay | Admitting: Cardiology

## 2014-09-13 ENCOUNTER — Other Ambulatory Visit: Payer: Self-pay | Admitting: Cardiology

## 2015-03-19 ENCOUNTER — Other Ambulatory Visit: Payer: Self-pay | Admitting: Cardiology

## 2015-04-06 ENCOUNTER — Encounter: Payer: Self-pay | Admitting: Cardiology

## 2015-05-03 ENCOUNTER — Other Ambulatory Visit: Payer: 59

## 2015-06-05 ENCOUNTER — Other Ambulatory Visit (INDEPENDENT_AMBULATORY_CARE_PROVIDER_SITE_OTHER): Payer: 59

## 2015-06-05 DIAGNOSIS — E7800 Pure hypercholesterolemia, unspecified: Secondary | ICD-10-CM

## 2015-06-05 LAB — LIPID PANEL
Cholesterol: 159 mg/dL (ref 0–200)
HDL: 47.6 mg/dL (ref >39.00)
LDL Cholesterol: 88 mg/dL (ref 0–99)
NonHDL: 111.4
Total CHOL/HDL Ratio: 3
Triglycerides: 116 mg/dL (ref 0.0–149.0)
VLDL: 23.2 mg/dL (ref 0.0–40.0)

## 2015-07-16 ENCOUNTER — Ambulatory Visit (INDEPENDENT_AMBULATORY_CARE_PROVIDER_SITE_OTHER): Payer: 59 | Admitting: Cardiology

## 2015-07-16 ENCOUNTER — Encounter: Payer: Self-pay | Admitting: Cardiology

## 2015-07-16 VITALS — BP 130/88 | HR 80 | Ht 66.0 in | Wt 223.4 lb

## 2015-07-16 DIAGNOSIS — I480 Paroxysmal atrial fibrillation: Secondary | ICD-10-CM | POA: Diagnosis not present

## 2015-07-16 DIAGNOSIS — I10 Essential (primary) hypertension: Secondary | ICD-10-CM | POA: Diagnosis not present

## 2015-07-16 DIAGNOSIS — E669 Obesity, unspecified: Secondary | ICD-10-CM

## 2015-07-16 DIAGNOSIS — E7800 Pure hypercholesterolemia, unspecified: Secondary | ICD-10-CM

## 2015-07-16 NOTE — Patient Instructions (Signed)
Medication Instructions:  Your physician recommends that you continue on your current medications as directed. Please refer to the Current Medication list given to you today.   Labwork: None  Testing/Procedures: NOne  Follow-Up: Your physician wants you to follow-up in: 1 year with Dr. Radford Pax. You will receive a reminder letter in the mail two months in advance. If you don't receive a letter, please call our office to schedule the follow-up appointment.   Any Other Special Instructions Will Be Listed Below (If Applicable).

## 2015-07-16 NOTE — Progress Notes (Signed)
Cardiology Office Note   Date:  07/16/2015   ID:  Terry Kim, DOB 1961/03/19, MRN [id number]  PCP:  [facility]    Chief Complaint  Patient presents with  . Paroxysmal a-fib      History of Present Illness: Terry Kim is a 54 y.o. male with a history of PAF, HTN, dyslipidemia and obesity who presents today for followup. He is doing well. He denies any chest pain, SOB, DOE, LE edema, dizziness, palpitations or syncope.     Past Medical History  Diagnosis Date  . Obesity   . Paroxysmal a-fib   . Supraventricular premature beats   . Pure hypercholesterolemia   . Hypertension     Past Surgical History  Procedure Laterality Date  . Shoulder arthroscopy w/ rotator cuff repair  2012    left  . Shoulder arthroscopy  2000    right  . Tonsillectomy    . Knee arthroscopy with medial menisectomy Left 07/05/2014    Procedure: LEFT ARTHROSCOPY KNEE WITH MEDIAL MENISECTOMY/DEBRIDEMENT/SHAVING (CHONDROPLASTY)/ARTHROSCOPY KNEE ABRASION ARTHROPLASTY/MULTIPLE DRILLING;  Surgeon: Yvette Rack., MD;  Location: [facility];  Service: Orthopedics;  Laterality: Left;     Current Outpatient Prescriptions  Medication Sig Dispense Refill  . aspirin 325 MG tablet Take 325 mg by mouth daily.    . Multiple Vitamin (MULTI VITAMIN DAILY PO) Take by mouth.    . simvastatin (ZOCOR) 20 MG tablet TAKE 1 TABLET BY MOUTH DAILY 30 tablet 5  . TOPROL XL 50 MG 24 hr tablet TAKE 1 & 1/2 TABLETS BY MOUTH ONCE A DAY 135 tablet 3   No current facility-administered medications for this visit.    Allergies:   Penicillins and Skelaxin    Social History:  The patient  reports that he has never smoked. He does not have any smokeless tobacco history on file. He reports that he drinks alcohol. He reports that he does not use illicit drugs.   Family History:  The patient's family history includes Hypertension in his mother.    ROS:  Please see the history of present  illness.   Otherwise, review of systems are positive for none.   All other systems are reviewed and negative.    PHYSICAL EXAM: VS:  BP 130/88 mmHg  Pulse 80  Ht 5\' 6"  (1.676 m)  Wt 223 lb 6.4 oz (101.334 kg)  BMI 36.08 kg/m2 , BMI Body mass index is 36.08 kg/(m^2). GEN: Well nourished, well developed, in no acute distress HEENT: normal Neck: no JVD, carotid bruits, or masses Cardiac: RRR; no murmurs, rubs, or gallops,no edema  Respiratory:  clear to auscultation bilaterally, normal work of breathing GI: soft, nontender, nondistended, + BS MS: no deformity or atrophy Skin: warm and dry, no rash Neuro:  Strength and sensation are intact Psych: euthymic mood, full affect   EKG:  EKG was ordered today which showed NSR with no ST changes, rightward axis    Recent Labs: No results found for requested labs within last 365 days.    Lipid Panel    Component Value Date/Time   CHOL 159 06/05/2015 0848   TRIG 116.0 06/05/2015 0848   HDL 47.60 06/05/2015 0848   CHOLHDL 3 06/05/2015 0848   VLDL 23.2 06/05/2015 0848   LDLCALC 88 06/05/2015 0848      Wt Readings from Last 3 Encounters:  07/16/15 223 lb 6.4 oz (101.334 kg)  07/05/14 229 lb (103.874 kg)  07/04/14 235 lb (106.595 kg)    ASSESSMENT AND PLAN:  1. PAF maintaining NSR - continue ASA/BB 2. HTN - controlled - continue Toprol 3. Obesity - he is exercising and I encouraged him to watch his portions 4. Dyslipidemia - LDL at goal at 88 - continue statin    Current medicines are reviewed at length with the patient today.  The patient does not have concerns regarding medicines.  The following changes have been made:  no change  Labs/ tests ordered today: See above Assessment and Plan No orders of the defined types were placed in this encounter.     Disposition:   FU with me in 1 year  Signed, Sueanne Margarita, MD  07/16/2015 2:36 PM    [facility] [place], [place], [place]   [address] Phone: [phone]; Fax: [phone]

## 2015-10-23 ENCOUNTER — Other Ambulatory Visit: Payer: Self-pay | Admitting: Cardiology

## 2015-10-23 NOTE — Telephone Encounter (Signed)
Sueanne Margarita, MD at 07/16/2015 1:38 PM  TOPROL XL 50 MG 24 hr tabletTAKE 1 & 1/2 TABLETS BY MOUTH ONCE A DAY 2. HTN - controlled - continue Toprol Current medicines are reviewed at length with the patient today. The patient does not have concerns regarding medicines.  The following changes have been made: no change

## 2016-01-22 ENCOUNTER — Other Ambulatory Visit: Payer: Self-pay | Admitting: *Deleted

## 2016-01-22 MED ORDER — SIMVASTATIN 20 MG PO TABS: 20.0000 mg | ORAL_TABLET | Freq: Every day | ORAL | Status: DC

## 2016-01-22 MED FILL — SIMVASTATIN 20 MG TABLET: 20 | 90 days supply | Qty: 90 | Fill #0

## 2016-01-22 MED FILL — METOPROLOL SUCC ER 50 MG TA: 50 | 90 days supply | Qty: 135 | Fill #1

## 2016-02-19 IMAGING — MR MR KNEE*L* W/O CM
4 of 5 series · 19 of 40 positions shown · non-contrast
Comparison: MRI left knee 09/20/2003.

CLINICAL DATA: Left knee injury 1 month ago.  Continued pain.

EXAM:
MRI OF THE LEFT KNEE WITHOUT CONTRAST
TECHNIQUE: Multiplanar, multisequence MR imaging of the knee was performed. No
intravenous contrast was administered.

[Series 2: PD fat-sat · axial · 4.0mm · 0.29mm/px · z∈[-17,+93]mm · 8 of 23 slices shown (1 of 3)]
[im 1/23]
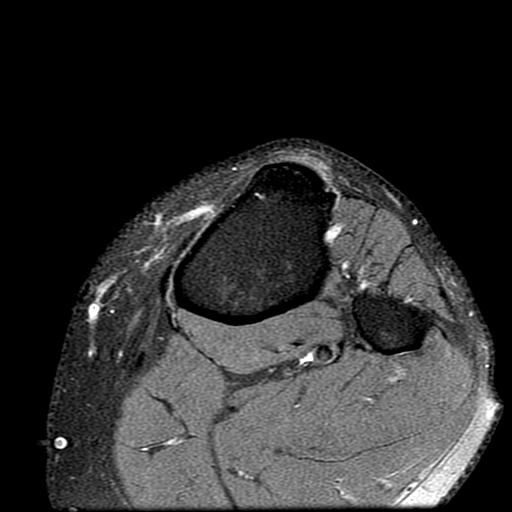
[im 4/23]
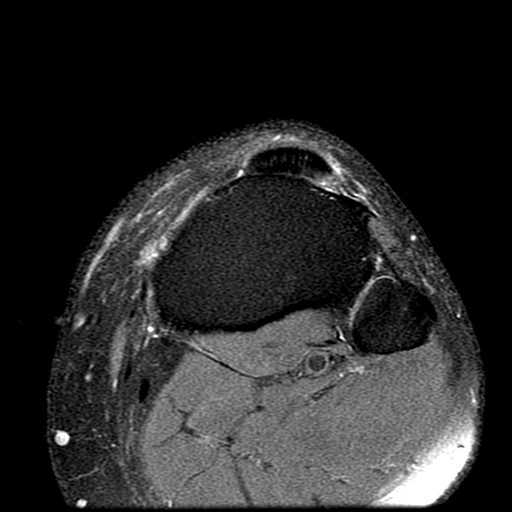
[im 7/23]
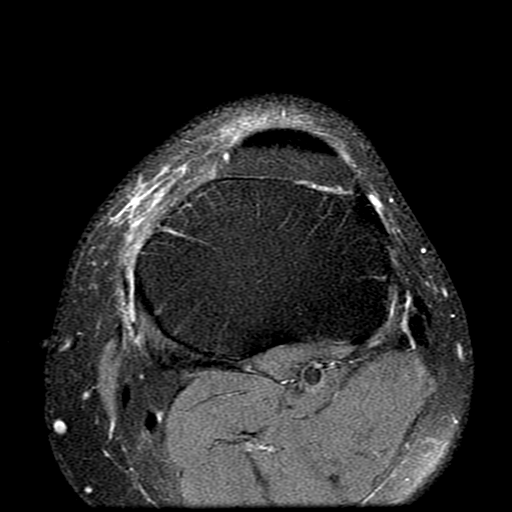
[im 10/23]
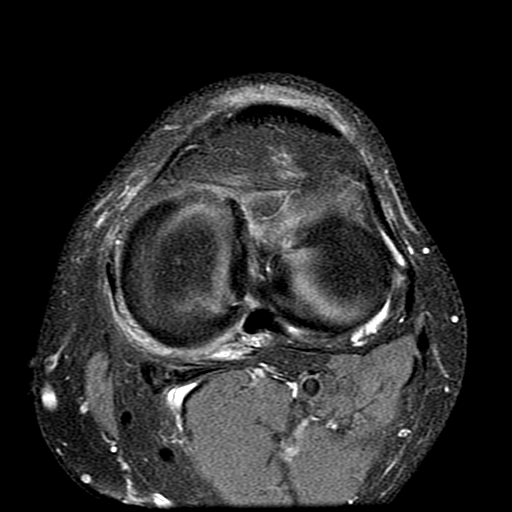
[im 13/23]
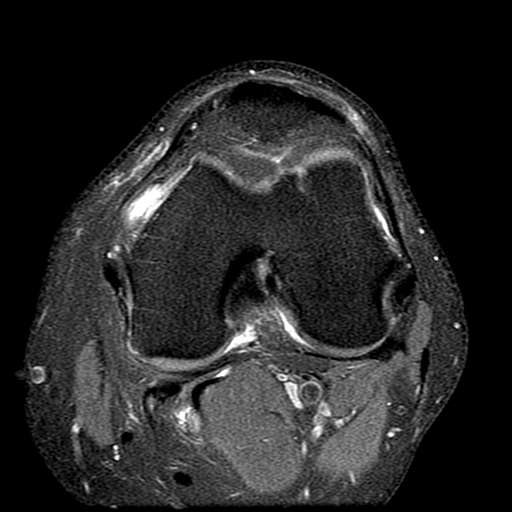
[im 16/23]
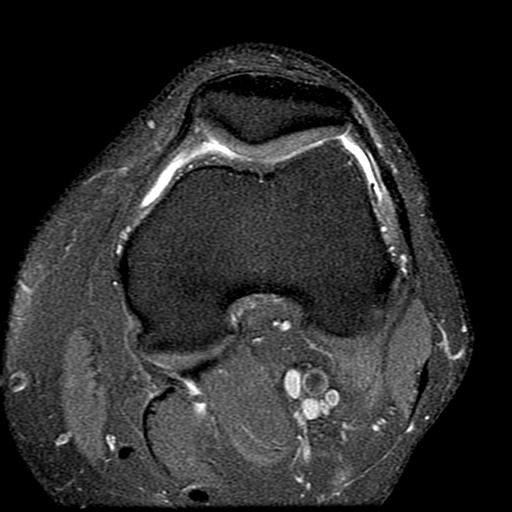
[im 19/23]
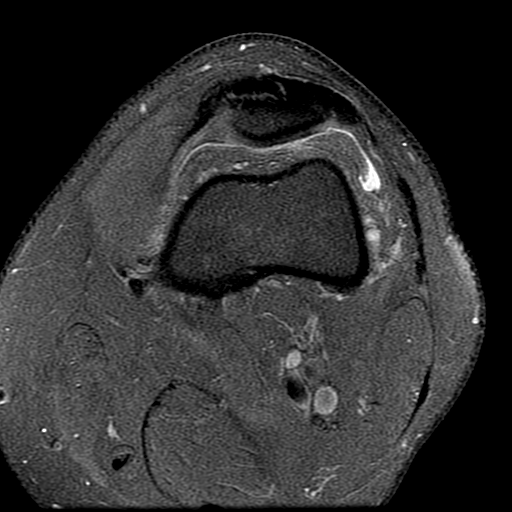
[im 23/23]
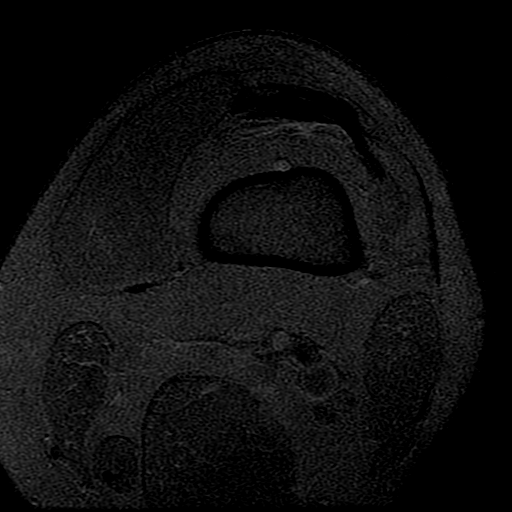

[Series 3: PD fat-sat · coronal · 4.0mm · 0.31mm/px · 5 of 21 slices shown (2 of 3)]
[im 1/21]
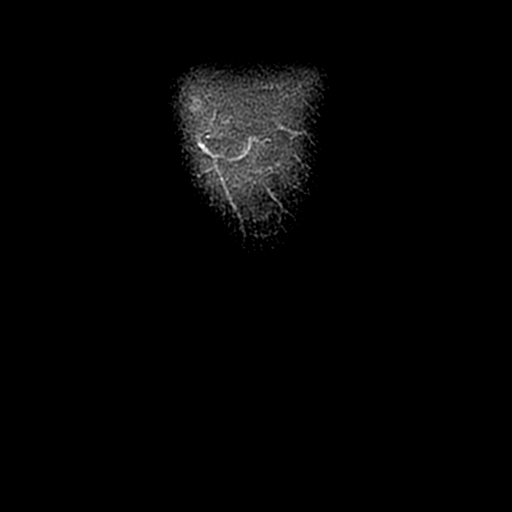
[im 3/21]
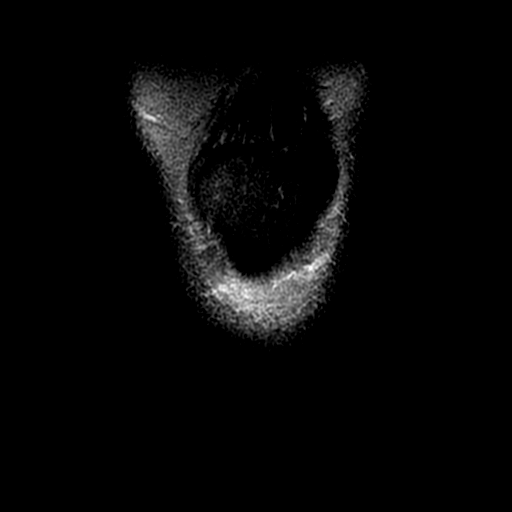
[im 6/21]
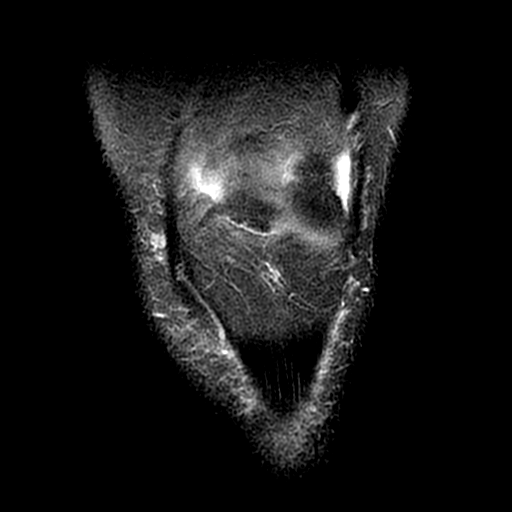
[im 12/21]
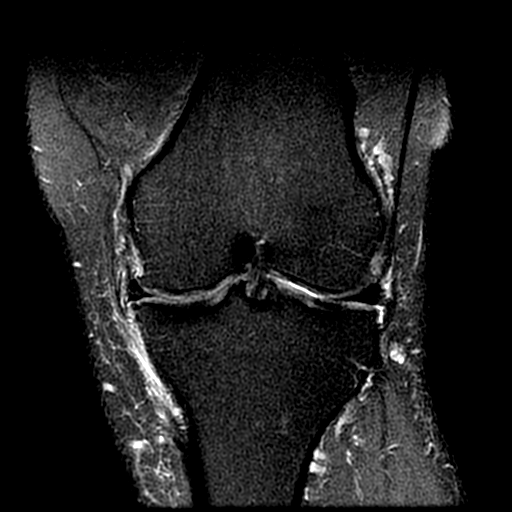
[im 18/21]
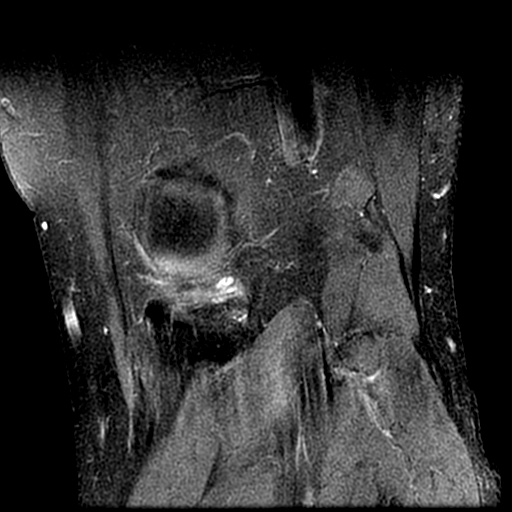

[Series 4: T2 fat-sat · coronal · 4.0mm · 0.31mm/px · 3 of 21 slices shown]
[im 3/21]
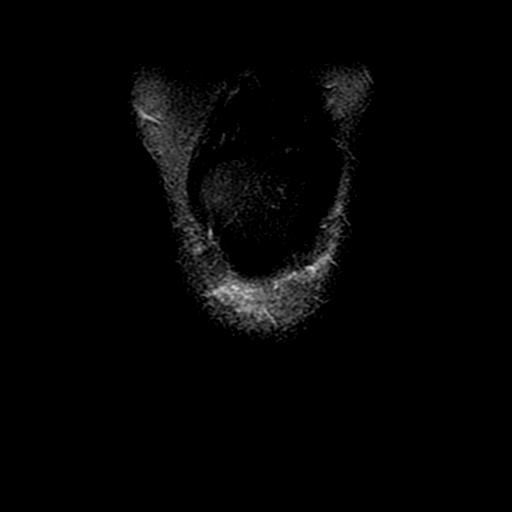
[im 12/21]
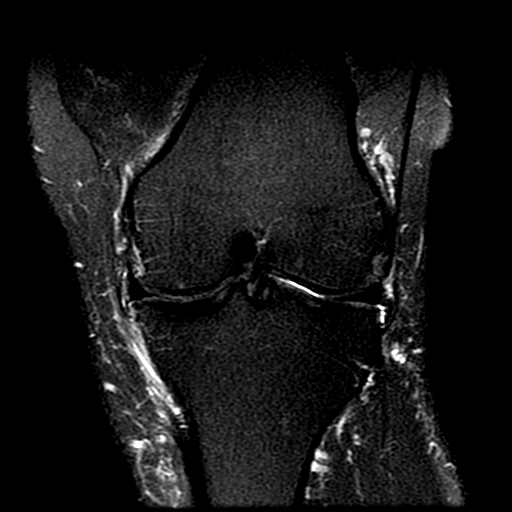
[im 18/21]
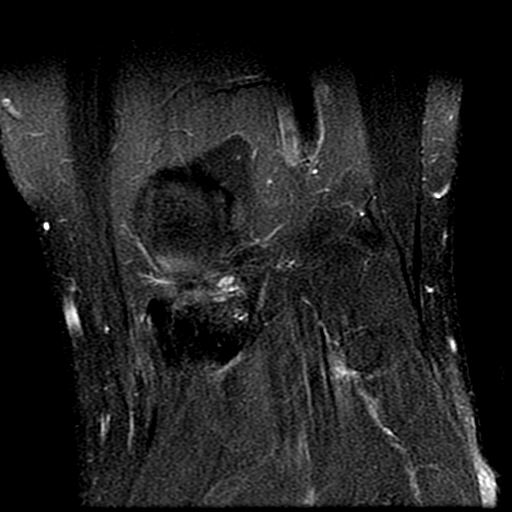

[Series 5: PD fat-sat · sagittal · 4.0mm · 0.31mm/px · 3 of 22 slices shown (3 of 3)]
[im 4/22]
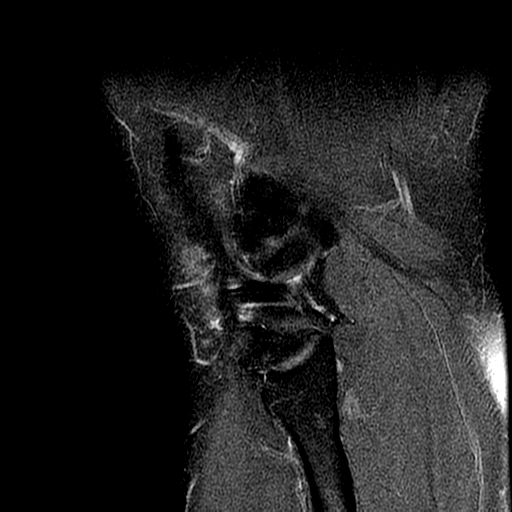
[im 13/22]
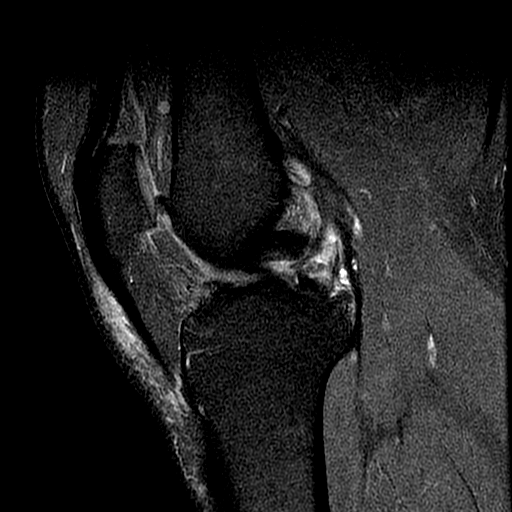
[im 19/22]
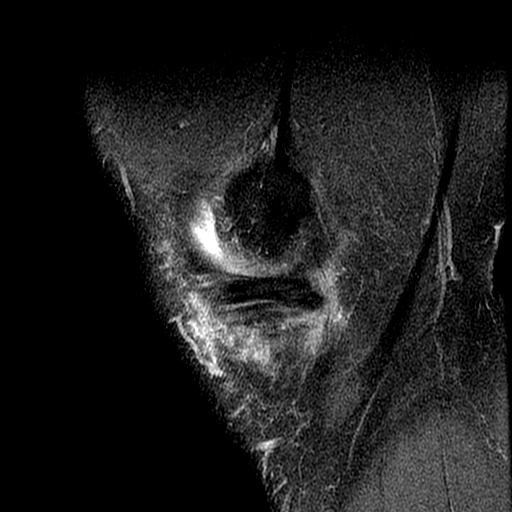

[19 of 40 positions shown; findings below may reference images not displayed]

FINDINGS: MENISCI

Medial meniscus: There is blunting along the free edge of the
posterior horn and body of the medial meniscus consistent with
fraying. There is a flap tear in the posterior horn with a small
meniscal fragment displaced over the superior aspect of the root of
the posterior horn.

Lateral meniscus:  Intact.

LIGAMENTS

Cruciates:  Intact.

Collaterals:  Intact.

CARTILAGE

Patellofemoral: Fissuring of hyaline cartilage of the lateral
femoral trochlea is identified.

Medial: There is a focal defect in hyaline cartilage in the
posterior aspect of the weight-bearing surface of the medial femoral
condyle measuring 0.7 cm AP x 0.5 cm transverse. The defect is near
full-thickness but there is no underlying reactive marrow signal
change.

Lateral: A focal fissure in hyaline cartilage is seen in the
posterior aspect of the lateral tibial plateau measuring
approximately 0.9 cm AP.

Joint:  Trace amount of joint fluid is present.

Popliteal Fossa:  Tiny Baker's cyst is noted.

Extensor Mechanism:  Intact.

Bones: There is no fracture or worrisome marrow lesion. Small
osteophytes are present about the knee and have progressed since the
prior examination.
IMPRESSION: Fraying along the free edge of the posterior horn and body of the
medial meniscus with a flap tear of the posterior horn. A small
meniscal fragment is displaced over the root of the posterior horn
as seen on images 6 and 7 of series 3.

Mild osteoarthritis about the knee has progressed since the prior
examination.

## 2016-05-05 ENCOUNTER — Other Ambulatory Visit: Payer: Self-pay | Admitting: Cardiology

## 2016-05-05 MED FILL — SIMVASTATIN 20 MG TABLET: 20 | 90 days supply | Qty: 90 | Fill #1

## 2016-05-06 MED FILL — METOPROLOL SUCC ER 50 MG TA: 50 | 90 days supply | Qty: 135 | Fill #0

## 2016-06-23 DIAGNOSIS — N50812 Left testicular pain: Secondary | ICD-10-CM | POA: Diagnosis not present

## 2016-07-02 DIAGNOSIS — Z0000 Encounter for general adult medical examination without abnormal findings: Secondary | ICD-10-CM | POA: Diagnosis not present

## 2016-07-03 DIAGNOSIS — Z0000 Encounter for general adult medical examination without abnormal findings: Secondary | ICD-10-CM | POA: Diagnosis not present

## 2016-07-10 DIAGNOSIS — I480 Paroxysmal atrial fibrillation: Secondary | ICD-10-CM | POA: Diagnosis not present

## 2016-07-10 DIAGNOSIS — Z1211 Encounter for screening for malignant neoplasm of colon: Secondary | ICD-10-CM | POA: Diagnosis not present

## 2016-07-22 DIAGNOSIS — N451 Epididymitis: Secondary | ICD-10-CM | POA: Diagnosis not present

## 2016-08-06 ENCOUNTER — Other Ambulatory Visit: Payer: Self-pay | Admitting: *Deleted

## 2016-08-06 ENCOUNTER — Other Ambulatory Visit: Payer: Self-pay | Admitting: Cardiology

## 2016-08-06 MED ORDER — METOPROLOL SUCCINATE ER 50 MG PO TB24: ORAL_TABLET | ORAL | 0 refills | Status: DC

## 2016-08-06 MED FILL — METOPROLOL SUCC ER 50 MG TA: 50 | 30 days supply | Qty: 45 | Fill #0

## 2016-08-07 MED FILL — SIMVASTATIN 20 MG TABLET: 20 | 60 days supply | Qty: 60 | Fill #0

## 2016-08-21 ENCOUNTER — Ambulatory Visit: Payer: 59 | Admitting: Cardiology

## 2016-09-04 DIAGNOSIS — K6389 Other specified diseases of intestine: Secondary | ICD-10-CM | POA: Diagnosis not present

## 2016-09-04 DIAGNOSIS — K5730 Diverticulosis of large intestine without perforation or abscess without bleeding: Secondary | ICD-10-CM | POA: Diagnosis not present

## 2016-09-04 DIAGNOSIS — K648 Other hemorrhoids: Secondary | ICD-10-CM | POA: Diagnosis not present

## 2016-09-04 DIAGNOSIS — K644 Residual hemorrhoidal skin tags: Secondary | ICD-10-CM | POA: Diagnosis not present

## 2016-09-04 DIAGNOSIS — K635 Polyp of colon: Secondary | ICD-10-CM | POA: Diagnosis not present

## 2016-09-04 DIAGNOSIS — Z1211 Encounter for screening for malignant neoplasm of colon: Secondary | ICD-10-CM | POA: Diagnosis not present

## 2016-09-04 DIAGNOSIS — D120 Benign neoplasm of cecum: Secondary | ICD-10-CM | POA: Diagnosis not present

## 2016-09-04 DIAGNOSIS — D125 Benign neoplasm of sigmoid colon: Secondary | ICD-10-CM | POA: Diagnosis not present

## 2016-09-05 ENCOUNTER — Other Ambulatory Visit: Payer: Self-pay | Admitting: *Deleted

## 2016-09-05 MED ORDER — METOPROLOL SUCCINATE ER 50 MG PO TB24: ORAL_TABLET | ORAL | 0 refills | Status: DC

## 2016-09-05 MED FILL — METOPROLOL SUCC ER 50 MG TA: 50 | 17 days supply | Qty: 26 | Fill #0

## 2016-09-09 ENCOUNTER — Encounter: Payer: Self-pay | Admitting: Cardiology

## 2016-09-11 ENCOUNTER — Other Ambulatory Visit (HOSPITAL_COMMUNITY): Payer: Self-pay | Admitting: Gastroenterology

## 2016-09-11 ENCOUNTER — Other Ambulatory Visit: Payer: Self-pay | Admitting: Gastroenterology

## 2016-09-11 DIAGNOSIS — K529 Noninfective gastroenteritis and colitis, unspecified: Secondary | ICD-10-CM

## 2016-09-15 DIAGNOSIS — R21 Rash and other nonspecific skin eruption: Secondary | ICD-10-CM | POA: Diagnosis not present

## 2016-09-18 ENCOUNTER — Other Ambulatory Visit: Payer: 59

## 2016-09-18 ENCOUNTER — Ambulatory Visit (HOSPITAL_COMMUNITY)
Admission: RE | Admit: 2016-09-18 | Discharge: 2016-09-18 | Disposition: A | Discharge status: Home / Self Care | Payer: 59 | Source: Ambulatory Visit | Attending: Gastroenterology | Admitting: Gastroenterology

## 2016-09-18 DIAGNOSIS — K5790 Diverticulosis of intestine, part unspecified, without perforation or abscess without bleeding: Secondary | ICD-10-CM | POA: Diagnosis not present

## 2016-09-18 DIAGNOSIS — K760 Fatty (change of) liver, not elsewhere classified: Secondary | ICD-10-CM | POA: Insufficient documentation

## 2016-09-18 DIAGNOSIS — K5730 Diverticulosis of large intestine without perforation or abscess without bleeding: Secondary | ICD-10-CM | POA: Insufficient documentation

## 2016-09-18 DIAGNOSIS — K529 Noninfective gastroenteritis and colitis, unspecified: Secondary | ICD-10-CM | POA: Insufficient documentation

## 2016-09-18 DIAGNOSIS — R911 Solitary pulmonary nodule: Secondary | ICD-10-CM | POA: Diagnosis not present

## 2016-09-18 MED ORDER — IOPAMIDOL (ISOVUE-300) INJECTION 61%
INTRAVENOUS | Status: AC
  Administered 2016-09-18: 100 mL
  Filled 2016-09-18: qty 100

## 2016-09-18 MED ORDER — BARIUM SULFATE 0.1 % PO SUSP
ORAL | Status: AC
  Filled 2016-09-18: qty 3

## 2016-09-22 ENCOUNTER — Ambulatory Visit (INDEPENDENT_AMBULATORY_CARE_PROVIDER_SITE_OTHER): Payer: 59 | Admitting: Cardiology

## 2016-09-22 ENCOUNTER — Encounter: Payer: Self-pay | Admitting: Cardiology

## 2016-09-22 VITALS — BP 138/78 | HR 75 | Ht 66.0 in | Wt 235.8 lb

## 2016-09-22 DIAGNOSIS — E7800 Pure hypercholesterolemia, unspecified: Secondary | ICD-10-CM | POA: Diagnosis not present

## 2016-09-22 DIAGNOSIS — I481 Persistent atrial fibrillation: Secondary | ICD-10-CM | POA: Diagnosis not present

## 2016-09-22 DIAGNOSIS — I4819 Other persistent atrial fibrillation: Secondary | ICD-10-CM

## 2016-09-22 DIAGNOSIS — E6609 Other obesity due to excess calories: Secondary | ICD-10-CM | POA: Diagnosis not present

## 2016-09-22 DIAGNOSIS — I10 Essential (primary) hypertension: Secondary | ICD-10-CM

## 2016-09-22 NOTE — Progress Notes (Signed)
Cardiology Office Note    Date:  09/22/2016   ID:  Terry Kim, DOB [birth year], MRN [id number]  PCP:  Melinda Crutch (Inactive)  Cardiologist:  Terry Him, MD   Chief Complaint  Patient presents with  . Atrial Fibrillation  . Hypertension    History of Present Illness:  Terry Kim is a 55 y.o. male with a history of Persistent atrial fibrillation (CHADS2VASC score 1), HTN, dyslipidemia and obesity who presents today for followup. He is doing well. He denies any chest pain, SOB, DOE, LE edema, dizziness, palpitations or syncope.  He sporadically exercises and is trying to get into a routine.  He does some elliptical exercises 3 days a week.    Past Medical History:  Diagnosis Date  . Hypertension   . Obesity   . Persistent atrial fibrillation (HCC)    CHADS2VASC score is 1  . Pure hypercholesterolemia   . Supraventricular premature beats     Past Surgical History:  Procedure Laterality Date  . KNEE ARTHROSCOPY WITH MEDIAL MENISECTOMY Left 07/05/2014   Procedure: LEFT ARTHROSCOPY KNEE WITH MEDIAL MENISECTOMY/DEBRIDEMENT/SHAVING (CHONDROPLASTY)/ARTHROSCOPY KNEE ABRASION ARTHROPLASTY/MULTIPLE DRILLING;  Surgeon: Yvette Rack., MD;  Location: [facility];  Service: Orthopedics;  Laterality: Left;  . SHOULDER ARTHROSCOPY  2000   right  . SHOULDER ARTHROSCOPY W/ ROTATOR CUFF REPAIR  2012   left  . TONSILLECTOMY      Current Medications: Outpatient Medications Prior to Visit  Medication Sig Dispense Refill  . aspirin 325 MG tablet Take 325 mg by mouth daily.    . metoprolol succinate (TOPROL-XL) 50 MG 24 hr tablet TAKE 1 & 1/2 TABLETS BY MOUTH ONCE A DAY 26 tablet 0  . Multiple Vitamin (MULTI VITAMIN DAILY PO) Take by mouth.    . simvastatin (ZOCOR) 20 MG tablet TAKE 1 TABLET BY MOUTH ONCE DAILY 30 tablet 1   No facility-administered medications prior to visit.      Allergies:   Penicillins; Skelaxin [metaxalone]; and Doxycycline   Social History    Social History  . Marital status: Divorced    Spouse name: N/A  . Number of children: N/A  . Years of education: N/A   Social History Main Topics  . Smoking status: Never Smoker  . Smokeless tobacco: Never Used  . Alcohol use Yes     Comment: most days 4xweek  . Drug use: No  . Sexual activity: Not Asked   Other Topics Concern  . None   Social History Narrative  . None     Family History:  The patient's family history includes Hypertension in his mother.   ROS:   Please see the history of present illness.    ROS All other systems reviewed and are negative.  No flowsheet data found.     PHYSICAL EXAM:   VS:  BP 138/78   Pulse 75   Ht 5\' 6"  (1.676 m)   Wt 235 lb 12.8 oz (107 kg)   BMI 38.06 kg/m    GEN: Well nourished, well developed, in no acute distress  HEENT: normal  Neck: no JVD, carotid bruits, or masses Cardiac: RRR; no murmurs, rubs, or gallops,no edema.  Intact distal pulses bilaterally.  Respiratory:  clear to auscultation bilaterally, normal work of breathing GI: soft, nontender, nondistended, + BS MS: no deformity or atrophy  Skin: warm and dry, no rash Neuro:  Alert and Oriented x 3, Strength and sensation are intact Psych: euthymic mood, full affect  Wt Readings  from Last 3 Encounters:  09/22/16 235 lb 12.8 oz (107 kg)  07/16/15 223 lb 6.4 oz (101.3 kg)  07/05/14 229 lb (103.9 kg)      Studies/Labs Reviewed:   EKG:  EKG is ordered today.  The ekg ordered today demonstrates NSR at 75bpm with no ST changes  Recent Labs: No results found for requested labs within last 8760 hours.   Lipid Panel    Component Value Date/Time   CHOL 159 06/05/2015 0848   TRIG 116.0 06/05/2015 0848   HDL 47.60 06/05/2015 0848   CHOLHDL 3 06/05/2015 0848   VLDL 23.2 06/05/2015 0848   LDLCALC 88 06/05/2015 0848    Additional studies/ records that were reviewed today include:  none    ASSESSMENT:    1. Persistent atrial fibrillation ([place])   2.  Essential hypertension   3. Pure hypercholesterolemia   4. Class 1 obesity due to excess calories with serious comorbidity in adult, unspecified BMI      PLAN:  In order of problems listed above:  1. Persistent atrial fibrillation maintaining NSR.  Continue BB and ASA. 2. HTN - BP controlled on current meds. Continue BB. 3. Hyperlipidemia - continue statin. LDL at goal at 96 and HDL at 42. 4. Obesity - I have encouraged Kim to get into a routine exercise program and cut back on carbs and portions. He has gained 12lbs in past year but is working night shift now and is trying to get adjusted to it.     Medication Adjustments/Labs and Tests Ordered: Current medicines are reviewed at length with the patient today.  Concerns regarding medicines are outlined above.  Medication changes, Labs and Tests ordered today are listed in the Patient Instructions below.  There are no Patient Instructions on file for this visit.   Signed, Terry Him, MD  09/22/2016 11:16 AM    [name], [place], [place]  [address] Phone: [phone]; Fax: [phone]

## 2016-09-22 NOTE — Patient Instructions (Signed)

## 2016-09-23 DIAGNOSIS — R21 Rash and other nonspecific skin eruption: Secondary | ICD-10-CM | POA: Diagnosis not present

## 2016-10-06 ENCOUNTER — Other Ambulatory Visit: Payer: Self-pay | Admitting: Cardiology

## 2016-10-07 MED FILL — SIMVASTATIN 20 MG TABLET: 20 | 30 days supply | Qty: 30 | Fill #0

## 2016-10-07 MED FILL — METOPROLOL SUCC ER 50 MG TA: 50 | 30 days supply | Qty: 45 | Fill #0

## 2016-10-29 DIAGNOSIS — L299 Pruritus, unspecified: Secondary | ICD-10-CM | POA: Diagnosis not present

## 2016-11-12 DIAGNOSIS — R21 Rash and other nonspecific skin eruption: Secondary | ICD-10-CM | POA: Diagnosis not present

## 2016-11-12 MED FILL — predniSONE 10 MG TABS: 10 | 5 days supply | Qty: 25 | Fill #0

## 2016-11-12 MED FILL — METOPROLOL SUCC ER 50 MG TA: 50 | 30 days supply | Qty: 45 | Fill #1

## 2016-11-12 MED FILL — SIMVASTATIN 20 MG TABLET: 20 | 90 days supply | Qty: 90 | Fill #1

## 2016-12-17 MED FILL — METOPROLOL SUCC ER 50 MG TA: 50 | 30 days supply | Qty: 45 | Fill #2

## 2017-01-06 ENCOUNTER — Encounter: Payer: Self-pay | Admitting: Allergy and Immunology

## 2017-01-06 ENCOUNTER — Ambulatory Visit (INDEPENDENT_AMBULATORY_CARE_PROVIDER_SITE_OTHER): Payer: 59 | Admitting: Allergy and Immunology

## 2017-01-06 VITALS — BP 130/80 | HR 72 | Temp 98.3°F | Resp 18 | Ht 66.0 in | Wt 244.0 lb

## 2017-01-06 DIAGNOSIS — L500 Allergic urticaria: Secondary | ICD-10-CM

## 2017-01-06 MED ORDER — MONTELUKAST SODIUM 10 MG PO TABS: ORAL_TABLET | ORAL | 5 refills | Status: DC

## 2017-01-06 MED ORDER — RANITIDINE HCL 150 MG PO TABS: ORAL_TABLET | ORAL | 5 refills | Status: DC

## 2017-01-06 MED FILL — MONTELUKAST SOD 10 MG TAB: 10 | 30 days supply | Qty: 30 | Fill #0

## 2017-01-06 MED FILL — raNITIdine HCL 150 MG TABS: 150 | 30 days supply | Qty: 60 | Fill #0

## 2017-01-06 NOTE — Patient Instructions (Addendum)
  1. Allergen avoidance measures  2. Every day utilize the following medications:   A. cetirizine 10 mg tablet twice a day  B. ranitidine 150 mg tablet twice a day  C. montelukast 10 mg tablet once a day  3. Review all previous blood test from [facility]  4. Contact clinic in 2 weeks regarding response to treatment  5. Further evaluation and treatment?

## 2017-01-06 NOTE — Progress Notes (Signed)
Dear Dr. Harrington Challenger,  Thank you for referring Terry Kim to the [facility] on 01/06/2017.   Below is a summation of this patient's evaluation and recommendations.  Thank you for your referral. I will keep you informed about this patient's response to treatment.   If you have any questions please do not hesitate to contact me.   Sincerely,  Jiles Prows, MD Allergy / Immunology [place]   ______________________________________________________________________    NEW PATIENT NOTE  Referring Provider: Lawerance Cruel, MD Primary Provider: Melinda Crutch (Inactive) Date of office visit: 01/06/2017    Subjective:   Chief Complaint:  Terry Kim (DOB: [birth year]) is a 56 y.o. male who presents to the clinic on 01/06/2017 with a chief complaint of Rash and Pruritis .     HPI: Raymand presents this clinic in evaluation of skin rash that's been present since November 2017 after receiving 30 days of doxycycline for epididymitis.  Apparently he has developed red raised circular lesions and patch-like lesions across his body that are intensely itchy and never heal with scar or hyperpigmentation and have required the administration of systemic steroids on 3 occasions. He does respond to the administration of systemic steroids with complete clearing of this issue only to relapse once the steroid wears off. His last systemic steroid administration was the second week of January. Since that point in time he is much better but still continues to have itchy lesions across his abdomen and occasionally has arms. He has not noticed as much redness and he does not feel as though these lesions are particularly circular at this point in time but they are definitely pruritic.  Other than his epididymitis requiring prolonged doxycycline administration he has not had any other new developments in his life the past  6 months. He has some knee arthritis on his right knee which has been a intermittent issue of many years duration. There is no significant environmental trigger that has occurred. He is not using any new medications or supplements.  He apparently did have blood tests performed around 6 months ago although the results of that testing is unavailable for review at this point in time.  Past Medical History:  Diagnosis Date  . Hypertension   . Obesity   . Persistent atrial fibrillation (HCC)    CHADS2VASC score is 1  . Pure hypercholesterolemia   . Supraventricular premature beats     Past Surgical History:  Procedure Laterality Date  . KNEE ARTHROSCOPY WITH MEDIAL MENISECTOMY Left 07/05/2014   Procedure: LEFT ARTHROSCOPY KNEE WITH MEDIAL MENISECTOMY/DEBRIDEMENT/SHAVING (CHONDROPLASTY)/ARTHROSCOPY KNEE ABRASION ARTHROPLASTY/MULTIPLE DRILLING;  Surgeon: Yvette Rack., MD;  Location: [facility];  Service: Orthopedics;  Laterality: Left;  . SHOULDER ARTHROSCOPY  2000   right  . SHOULDER ARTHROSCOPY W/ ROTATOR CUFF REPAIR  2012   left  . TONSILLECTOMY      Allergies as of 01/06/2017      Reactions   Penicillins Hives   Skelaxin [metaxalone] Itching   Doxycycline Rash      Medication List      aspirin 325 MG tablet Take 325 mg by mouth daily.   metoprolol succinate 50 MG 24 hr tablet Commonly known as:  TOPROL-XL TAKE 1 & 1/2 TABLETS BY MOUTH ONCE A DAY   MULTI VITAMIN DAILY PO Take by mouth.   simvastatin 20 MG tablet Commonly known as:  ZOCOR TAKE  1 TABLET BY MOUTH ONCE DAILY       Review of systems negative except as noted in HPI / PMHx or noted below:  Review of Systems  Constitutional: Negative.   HENT: Negative.   Eyes: Negative.   Respiratory: Negative.   Cardiovascular: Negative.   Gastrointestinal: Negative.   Genitourinary: Negative.   Musculoskeletal: Negative.   Skin: Negative.   Neurological: Negative.   Endo/Heme/Allergies: Negative.    Psychiatric/Behavioral: Negative.     Family History  Problem Relation Age of Onset  . Hypertension Mother   . Eczema Father     Social History   Social History  . Marital status: Divorced    Spouse name: N/A  . Number of children: N/A  . Years of education: N/A   Occupational History  . Not on file.   Social History Main Topics  . Smoking status: Never Smoker  . Smokeless tobacco: Never Used  . Alcohol use Yes     Comment: most days 4xweek  . Drug use: No  . Sexual activity: Not on file   Other Topics Concern  . Not on file   Social History Narrative  . No narrative on file    Environmental and Social history  Lives in a house with a dry environment, cats located inside the household, hardwood in the bedroom, no plastic on the bed or pillow, and no smoking ongoing with inside the household. 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  Objective:   Vitals:   01/06/17 0934  BP: 130/80  Pulse: 72  Resp: 18  Temp: 98.3 F (36.8 C)   Height: 5\' 6"  (167.6 cm) Weight: 244 lb (110.7 kg)  Physical Exam  Constitutional: He is well-developed, well-nourished, and in no distress.  HENT:  Head: Normocephalic. Head is without right periorbital erythema and without left periorbital erythema.  Right Ear: Tympanic membrane, external ear and ear canal normal.  Left Ear: Tympanic membrane, external ear and ear canal normal.  Nose: Nose normal. No mucosal edema or rhinorrhea.  Mouth/Throat: Oropharynx is clear and moist and mucous membranes are normal. No oropharyngeal exudate.  Eyes: Conjunctivae and lids are normal. Pupils are equal, round, and reactive to light.  Neck: Trachea normal. No tracheal deviation present. No thyromegaly present.  Cardiovascular: Normal rate, regular rhythm, S1 normal, S2 normal and normal heart sounds.   No murmur heard. Pulmonary/Chest: Effort normal. No stridor. No tachypnea. No respiratory distress. He has no  wheezes. He has no rales. He exhibits no tenderness.  Abdominal: Soft. He exhibits no distension and no mass. There is no hepatosplenomegaly. There is no tenderness. There is no rebound and no guarding.  Musculoskeletal: He exhibits no edema or tenderness.  Lymphadenopathy:       Head (right side): No tonsillar adenopathy present.       Head (left side): No tonsillar adenopathy present.    He has no cervical adenopathy.    He has no axillary adenopathy.  Neurological: He is alert. Gait normal.  Skin: Rash (faint erythema across abdomen) noted. He is not diaphoretic. No erythema. No pallor. Nails show no clubbing.  Psychiatric: Mood and affect normal.    Diagnostics: Allergy skin tests were performed. He demonstrated hypersensitivity to weeds and trees and house dust mite and cockroach.  Assessment and Plan:    1. Allergic urticaria     1. Allergen avoidance measures  2. Every day utilize the following medications:   A. cetirizine 10 mg tablet  twice a day  B. ranitidine 150 mg tablet twice a day  C. montelukast 10 mg tablet once a day  3. Review all previous blood test from [facility]  4. Contact clinic in 2 weeks regarding response to treatment  5. Further evaluation and treatment?  Antony Haste certainly has immunological hyperreactivity and the etiologic factor responsible for this overactivity is not entirely clear. However, given the temporal relationship between his episode of epididymitis and use of doxycycline for 30 days and the development of this immunological hyperactivity I suspect that it was either his primary infection or the use of doxycycline for 30 days that set this off. His history does suggest that he may be burning out this process at this point and I'm going to have him use a collection of agents over the course of the next several weeks to see we can completely eliminate this issue. If that is the case and he continues to improve as he goes forward then we will slowly  taper down his medications. Certainly if he develops significant problems in the face of this approach we need to have him undergo further evaluation and possibly consider starting him on omalizumab. I will be reviewing the results of blood tests performed over the course of the past 6 months and make a decision about whether or not he requires further evaluation for this issue.  Jiles Prows, MD [facility]

## 2017-01-22 MED FILL — METOPROLOL SUCC ER 50 MG TA: 50 | 30 days supply | Qty: 45 | Fill #3

## 2017-01-23 MED FILL — SIMVASTATIN 20 MG TABLET: 20 | 90 days supply | Qty: 90 | Fill #2

## 2017-02-04 MED FILL — MONTELUKAST SOD 10 MG TAB: 10 | 30 days supply | Qty: 30 | Fill #1

## 2017-02-18 MED FILL — METOPROLOL SUCC ER 50 MG TA: 50 | 90 days supply | Qty: 135 | Fill #4

## 2017-03-17 MED FILL — MONTELUKAST SOD 10 MG TAB: 10 | 30 days supply | Qty: 30 | Fill #2

## 2017-03-25 DIAGNOSIS — K429 Umbilical hernia without obstruction or gangrene: Secondary | ICD-10-CM | POA: Diagnosis not present

## 2017-04-29 DIAGNOSIS — K429 Umbilical hernia without obstruction or gangrene: Secondary | ICD-10-CM | POA: Diagnosis not present

## 2017-05-31 DIAGNOSIS — J069 Acute upper respiratory infection, unspecified: Secondary | ICD-10-CM | POA: Diagnosis not present

## 2017-05-31 DIAGNOSIS — R509 Fever, unspecified: Secondary | ICD-10-CM | POA: Diagnosis not present

## 2017-06-03 DIAGNOSIS — R05 Cough: Secondary | ICD-10-CM | POA: Diagnosis not present

## 2017-06-08 MED FILL — MONTELUKAST SOD 10 MG TAB: 10 | 30 days supply | Qty: 30 | Fill #3

## 2017-07-14 DIAGNOSIS — R05 Cough: Secondary | ICD-10-CM | POA: Diagnosis not present

## 2017-07-23 MED FILL — METOPROLOL SUCC ER 50 MG TA: 50 | 90 days supply | Qty: 135 | Fill #5

## 2017-07-23 MED FILL — SIMVASTATIN 20 MG TABLET: 20 | 90 days supply | Qty: 90 | Fill #3

## 2017-08-03 DIAGNOSIS — Z0000 Encounter for general adult medical examination without abnormal findings: Secondary | ICD-10-CM | POA: Diagnosis not present

## 2017-08-03 DIAGNOSIS — G479 Sleep disorder, unspecified: Secondary | ICD-10-CM | POA: Diagnosis not present

## 2017-08-04 MED FILL — MONTELUKAST SOD 10 MG TAB: 10 | 30 days supply | Qty: 30 | Fill #4

## 2017-11-05 ENCOUNTER — Other Ambulatory Visit: Payer: Self-pay | Admitting: Cardiology

## 2017-11-05 MED FILL — SIMVASTATIN 20 MG TABLET: 20 | 30 days supply | Qty: 30 | Fill #0

## 2017-11-05 MED FILL — METOPROLOL SUCC ER 50 MG TA: 50 | 30 days supply | Qty: 45 | Fill #0

## 2017-11-11 DIAGNOSIS — I491 Atrial premature depolarization: Secondary | ICD-10-CM | POA: Insufficient documentation

## 2017-11-23 MED FILL — MONTELUKAST SOD 10 MG TAB: 10 | 30 days supply | Qty: 30 | Fill #5

## 2017-11-29 NOTE — Progress Notes (Signed)
Cardiology Office Note:    Date:  11/30/2017   ID:  Terry Kim, DOB [birth year], MRN [id number]  PCP:  Melinda Crutch (Inactive)  Cardiologist:  No primary care provider on file.    Referring MD: No ref. provider found   Chief Complaint  Patient presents with  . Follow-up    PAF, HTN    History of Present Illness:    Terry Kim is a 57 y.o. male with a hx of with a history of Persistent atrial fibrillation (CHADS2VASC score 1), HTN, dyslipidemia and obesity.  He is here today for followup and is doing well.  He denies any chest pain or pressure, PND, orthopnea, LE edema, dizziness, palpitations or syncope. He has had some problems with SOB recently due to a URI.  He is compliant with his meds and is tolerating meds with no SE.     Past Medical History:  Diagnosis Date  . Hypertension   . Obesity   . Persistent atrial fibrillation (HCC)    CHADS2VASC score is 1  . Pure hypercholesterolemia   . Supraventricular premature beats     Past Surgical History:  Procedure Laterality Date  . KNEE ARTHROSCOPY WITH MEDIAL MENISECTOMY Left 07/05/2014   Procedure: LEFT ARTHROSCOPY KNEE WITH MEDIAL MENISECTOMY/DEBRIDEMENT/SHAVING (CHONDROPLASTY)/ARTHROSCOPY KNEE ABRASION ARTHROPLASTY/MULTIPLE DRILLING;  Surgeon: Yvette Rack., MD;  Location: [facility];  Service: Orthopedics;  Laterality: Left;  . SHOULDER ARTHROSCOPY  2000   right  . SHOULDER ARTHROSCOPY W/ ROTATOR CUFF REPAIR  2012   left  . TONSILLECTOMY      Current Medications: Current Meds  Medication Sig  . aspirin 325 MG tablet Take 325 mg by mouth daily.  . metoprolol succinate (TOPROL-XL) 50 MG 24 hr tablet TAKE 1 & 1/2 TABLETS BY MOUTH ONCE A DAY. Please keep upcoming appt in January for future refills. Thank you  . montelukast (SINGULAIR) 10 MG tablet Take one tablet once daily as directed  . Multiple Vitamin (MULTI VITAMIN DAILY PO) Take by mouth.  . ranitidine (ZANTAC) 150 MG tablet Take one tablet  twice daily as directed  . simvastatin (ZOCOR) 20 MG tablet Take 1 tablet (20 mg total) by mouth daily. Please keep upcoming appt in January for future refills. Thank you     Allergies:   Penicillins; Skelaxin [metaxalone]; and Doxycycline   Social History   Socioeconomic History  . Marital status: Divorced    Spouse name: None  . Number of children: None  . Years of education: None  . Highest education level: None  Social Needs  . Financial resource strain: None  . Food insecurity - worry: None  . Food insecurity - inability: None  . Transportation needs - medical: None  . Transportation needs - non-medical: None  Occupational History  . None  Tobacco Use  . Smoking status: Never Smoker  . Smokeless tobacco: Never Used  Substance and Sexual Activity  . Alcohol use: Yes    Comment: most days 4xweek  . Drug use: No  . Sexual activity: None  Other Topics Concern  . None  Social History Narrative  . None     Family History: The patient's family history includes Eczema in his father; Hypertension in his mother.  ROS:   Please see the history of present illness.    ROS  All other systems reviewed and negative.   EKGs/Labs/Other Studies Reviewed:    The following studies were reviewed today: none  EKG:  EKG is  ordered today.  The ekg ordered today demonstrates NSR at 74bpm with no ST changes  Recent Labs: No results found for requested labs within last 8760 hours.   Recent Lipid Panel    Component Value Date/Time   CHOL 159 06/05/2015 0848   TRIG 116.0 06/05/2015 0848   HDL 47.60 06/05/2015 0848   CHOLHDL 3 06/05/2015 0848   VLDL 23.2 06/05/2015 0848   LDLCALC 88 06/05/2015 0848    Physical Exam:    VS:  BP 118/86   Pulse 74   Ht 5\' 6"  (1.676 m)   Wt 251 lb 3.2 oz (113.9 kg)   BMI 40.54 kg/m     Wt Readings from Last 3 Encounters:  11/30/17 251 lb 3.2 oz (113.9 kg)  01/06/17 244 lb (110.7 kg)  09/22/16 235 lb 12.8 oz (107 kg)     GEN:  Well  nourished, well developed in no acute distress HEENT: Normal NECK: No JVD; No carotid bruits LYMPHATICS: No lymphadenopathy CARDIAC: RRR, no murmurs, rubs, gallops RESPIRATORY:  Clear to auscultation without rales, wheezing or rhonchi  ABDOMEN: Soft, non-tender, non-distended MUSCULOSKELETAL:  No edema; No deformity  SKIN: Warm and dry NEUROLOGIC:  Alert and oriented x 3 PSYCHIATRIC:  Normal affect   ASSESSMENT:    1. Persistent atrial fibrillation ([place])   2. Essential hypertension   3. Pure hypercholesterolemia    PLAN:    In order of problems listed above:  1.  Persistent atrial fibrillation - He is maintaining NSR on current meds.  He will continue on Toprol XL 75mg  daily.  His CHADS2VASC score is 1 so not on anticoagulation.   2.  HTN - BP is well controlled on exam today.  He will continue on Toprol XL 75mg  daily.    3.  Hyperlipidemia - this is followed by his PCP.  He will continue on simvastatin.    Medication Adjustments/Labs and Tests Ordered: Current medicines are reviewed at length with the patient today.  Concerns regarding medicines are outlined above.  Orders Placed This Encounter  Procedures  . EKG 12-Lead   No orders of the defined types were placed in this encounter.   Signed, Fransico Him, MD  11/30/2017 12:53 PM    [facility]

## 2017-11-30 ENCOUNTER — Ambulatory Visit: Payer: 59 | Admitting: Cardiology

## 2017-11-30 ENCOUNTER — Encounter: Payer: Self-pay | Admitting: Cardiology

## 2017-11-30 VITALS — BP 118/86 | HR 74 | Ht 66.0 in | Wt 251.2 lb

## 2017-11-30 DIAGNOSIS — I481 Persistent atrial fibrillation: Secondary | ICD-10-CM

## 2017-11-30 DIAGNOSIS — I4819 Other persistent atrial fibrillation: Secondary | ICD-10-CM

## 2017-11-30 DIAGNOSIS — I10 Essential (primary) hypertension: Secondary | ICD-10-CM | POA: Diagnosis not present

## 2017-11-30 DIAGNOSIS — E7800 Pure hypercholesterolemia, unspecified: Secondary | ICD-10-CM

## 2017-11-30 NOTE — Patient Instructions (Signed)

## 2017-12-11 DIAGNOSIS — K429 Umbilical hernia without obstruction or gangrene: Secondary | ICD-10-CM | POA: Diagnosis not present

## 2017-12-22 ENCOUNTER — Other Ambulatory Visit: Payer: Self-pay | Admitting: Cardiology

## 2017-12-22 MED FILL — METOPROLOL SUCCINATE ER 50: 50 | 90 days supply | Qty: 135 | Fill #0

## 2017-12-22 MED FILL — SIMVASTATIN 20 MG TABLET: 20 | 90 days supply | Qty: 90 | Fill #0

## 2018-01-15 DIAGNOSIS — J45909 Unspecified asthma, uncomplicated: Secondary | ICD-10-CM | POA: Diagnosis not present

## 2018-02-01 ENCOUNTER — Other Ambulatory Visit: Payer: Self-pay | Admitting: Allergy and Immunology

## 2018-02-08 DIAGNOSIS — J45909 Unspecified asthma, uncomplicated: Secondary | ICD-10-CM | POA: Diagnosis not present

## 2018-02-08 MED FILL — MONTELUKAST SOD 10 MG TAB: 10 | 30 days supply | Qty: 30 | Fill #0

## 2018-03-24 MED FILL — METOPROLOL SUCCINATE ER 50: 50 | 90 days supply | Qty: 135 | Fill #1

## 2018-03-24 MED FILL — MONTELUKAST SOD 10 MG TAB: 10 | 30 days supply | Qty: 30 | Fill #1

## 2018-07-14 MED FILL — SIMVASTATIN 20 MG TABLET: 20 | 90 days supply | Qty: 90 | Fill #1

## 2018-07-14 MED FILL — METOPROLOL SUCCINATE ER 50: 50 | 90 days supply | Qty: 135 | Fill #2

## 2018-09-22 MED FILL — MONTELUKAST SOD 10 MG TAB: 10 | 30 days supply | Qty: 30 | Fill #2

## 2018-10-12 DIAGNOSIS — J45909 Unspecified asthma, uncomplicated: Secondary | ICD-10-CM | POA: Diagnosis not present

## 2018-10-28 MED FILL — METOPROLOL SUCCINATE ER 50: 50 | 90 days supply | Qty: 135 | Fill #3

## 2018-10-28 MED FILL — MONTELUKAST SOD 10 MG TAB: 10 | 30 days supply | Qty: 30 | Fill #3

## 2018-10-28 MED FILL — SIMVASTATIN 20 MG TABLET: 20 | 90 days supply | Qty: 90 | Fill #2

## 2018-11-01 ENCOUNTER — Ambulatory Visit: Payer: 59 | Admitting: Pulmonary Disease

## 2018-11-01 ENCOUNTER — Encounter: Payer: Self-pay | Admitting: Pulmonary Disease

## 2018-11-01 VITALS — BP 134/82 | HR 93 | Ht 67.0 in | Wt 256.4 lb

## 2018-11-01 DIAGNOSIS — K219 Gastro-esophageal reflux disease without esophagitis: Secondary | ICD-10-CM

## 2018-11-01 DIAGNOSIS — Z9189 Other specified personal risk factors, not elsewhere classified: Secondary | ICD-10-CM

## 2018-11-01 DIAGNOSIS — R062 Wheezing: Secondary | ICD-10-CM

## 2018-11-01 DIAGNOSIS — Z6841 Body Mass Index (BMI) 40.0 and over, adult: Secondary | ICD-10-CM | POA: Diagnosis not present

## 2018-11-01 DIAGNOSIS — R059 Cough, unspecified: Secondary | ICD-10-CM

## 2018-11-01 DIAGNOSIS — R05 Cough: Secondary | ICD-10-CM | POA: Diagnosis not present

## 2018-11-01 DIAGNOSIS — Z889 Allergy status to unspecified drugs, medicaments and biological substances status: Secondary | ICD-10-CM

## 2018-11-01 LAB — CBC WITH DIFFERENTIAL/PLATELET
Basophils Absolute: 0.1 10*3/uL (ref 0.0–0.1)
Basophils Relative: 1.4 % (ref 0.0–3.0)
Eosinophils Absolute: 0.1 10*3/uL (ref 0.0–0.7)
Eosinophils Relative: 2 % (ref 0.0–5.0)
HCT: 45.8 % (ref 39.0–52.0)
Hemoglobin: 15.4 g/dL (ref 13.0–17.0)
LYMPHS PCT: 30.3 % (ref 12.0–46.0)
Lymphs Abs: 1.6 10*3/uL (ref 0.7–4.0)
MCHC: 33.7 g/dL (ref 30.0–36.0)
MCV: 92.7 fl (ref 78.0–100.0)
Monocytes Absolute: 0.5 10*3/uL (ref 0.1–1.0)
Monocytes Relative: 10.3 % (ref 3.0–12.0)
Neutro Abs: 3 10*3/uL (ref 1.4–7.7)
Neutrophils Relative %: 56 % (ref 43.0–77.0)
Platelets: 223 10*3/uL (ref 150.0–400.0)
RBC: 4.95 Mil/uL (ref 4.22–5.81)
RDW: 14.6 % (ref 11.5–15.5)
WBC: 5.3 10*3/uL (ref 4.0–10.5)

## 2018-11-01 LAB — POCT EXHALED NITRIC OXIDE: FENO LEVEL (PPB): 28

## 2018-11-01 MED ORDER — BUDESONIDE-FORMOTEROL FUMARATE 160-4.5 MCG/ACT IN AERO: 2.0000 | INHALATION_SPRAY | Freq: Two times a day (BID) | RESPIRATORY_TRACT | 0 refills | Status: DC

## 2018-11-01 NOTE — Patient Instructions (Addendum)
Thank you for visiting Dr. Valeta Harms at [facility].  Today we recommend the following: Orders Placed This Encounter  Procedures  . CBC w/Diff  . Resp Allergy Profile Regn2DC [place] MD [place] [facility]  . IgG, IgA, IgM  . POCT EXHALED NITRIC OXIDE  . Pulmonary function test   Meds ordered this encounter  Medications  . budesonide-formoterol (SYMBICORT) 160-4.5 MCG/ACT inhaler    Sig: Inhale 2 puffs into the lungs every 12 (twelve) hours.    Dispense:  1 Inhaler    Refill:  0   Start OTC omeprazole 20mg  daily 27mins before breakfast.   Please set patient up with Dr. Ander Slade for next available sleep consultation.  Return in about 8 weeks (around 12/27/2018).

## 2018-11-01 NOTE — Addendum Note (Signed)
Addended by: Suzzanne Cloud E on: 11/01/2018 03:22 PM   Modules accepted: Orders

## 2018-11-01 NOTE — Progress Notes (Signed)
Synopsis: Referred in December 2019 for productive cough by Leighton Ruff, MD  Subjective:   PATIENT ID: Terry Kim GENDER: male DOB: 28-Oct-1961, MRN: [id number]  Chief Complaint  Patient presents with  . Consult    States he has had a cough for about a year and a half. States he got the flu last year and every since his lungs have been crappy. Cough intermittently with geen and yellow mucous. States his coughing episodes sometime give him chest pains.     PMH of htn, obesity. For the past year he has ongoing productive cough. Cough is usually worse with extreme changes of temperature. Hot to cold and cold to hot temp. Laughing makes him cough. Excessive talking will make him cough. Every other month he has has excessive symptoms and will see his PCP and get placed on antibiotics with steroids. All of the steroids (history of allergic reaction to doxycycline) that started with rash and was treated with steroids that happened for a period of a year and was being treated for epidymitis. He has a history of hay fever and allergies to dust mites. Given albuterol, benadryl and daily histamine. He has used hycodan cough syurp in the past too.  Over the last few years he has noticed worsening reflux.  Family history with a uncle who had immunoglobulin deficiency ultimately requiring lung transplantation.   Past Medical History:  Diagnosis Date  . Hypertension   . Obesity   . Persistent atrial fibrillation    CHADS2VASC score is 1  . Pure hypercholesterolemia   . Supraventricular premature beats      Family History  Problem Relation Age of Onset  . Hypertension Mother   . Eczema Father      Past Surgical History:  Procedure Laterality Date  . KNEE ARTHROSCOPY WITH MEDIAL MENISECTOMY Left 07/05/2014   Procedure: LEFT ARTHROSCOPY KNEE WITH MEDIAL MENISECTOMY/DEBRIDEMENT/SHAVING (CHONDROPLASTY)/ARTHROSCOPY KNEE ABRASION ARTHROPLASTY/MULTIPLE DRILLING;  Surgeon: Yvette Rack., MD;   Location: [facility];  Service: Orthopedics;  Laterality: Left;  . SHOULDER ARTHROSCOPY  2000   right  . SHOULDER ARTHROSCOPY W/ ROTATOR CUFF REPAIR  2012   left  . TONSILLECTOMY      Social History   Socioeconomic History  . Marital status: Divorced    Spouse name: Not on file  . Number of children: Not on file  . Years of education: Not on file  . Highest education level: Not on file  Occupational History  . Not on file  Social Needs  . Financial resource strain: Not on file  . Food insecurity:    Worry: Not on file    Inability: Not on file  . Transportation needs:    Medical: Not on file    Non-medical: Not on file  Tobacco Use  . Smoking status: Never Smoker  . Smokeless tobacco: Never Used  Substance and Sexual Activity  . Alcohol use: Yes    Comment: most days 4xweek  . Drug use: No  . Sexual activity: Not on file  Lifestyle  . Physical activity:    Days per week: Not on file    Minutes per session: Not on file  . Stress: Not on file  Relationships  . Social connections:    Talks on phone: Not on file    Gets together: Not on file    Attends religious service: Not on file    Active member of club or organization: Not on file    Attends  meetings of clubs or organizations: Not on file    Relationship status: Not on file  . Intimate partner violence:    Fear of current or ex partner: Not on file    Emotionally abused: Not on file    Physically abused: Not on file    Forced sexual activity: Not on file  Other Topics Concern  . Not on file  Social History Narrative  . Not on file     Allergies  Allergen Reactions  . Penicillins Hives  . Skelaxin [Metaxalone] Itching  . Doxycycline Rash     Outpatient Medications Prior to Visit  Medication Sig Dispense Refill  . aspirin 325 MG tablet Take 325 mg by mouth daily.    . cetirizine (ZYRTEC) 10 MG tablet Take 10 mg by mouth daily.    . metoprolol succinate (TOPROL-XL) 50 MG 24 hr tablet  TAKE 1 & 1/2 TABLETS BY MOUTH ONCE A DAY. 135 tablet 3  . montelukast (SINGULAIR) 10 MG tablet Take one tablet once daily as directed 30 tablet 5  . Multiple Vitamin (MULTI VITAMIN DAILY PO) Take by mouth.    . simvastatin (ZOCOR) 20 MG tablet Take 1 tablet (20 mg total) by mouth daily at 6 PM. 90 tablet 3  . ranitidine (ZANTAC) 150 MG tablet Take one tablet twice daily as directed (Patient not taking: Reported on 11/01/2018) 60 tablet 5   No facility-administered medications prior to visit.     Review of Systems  Constitutional: Negative for chills, fever, malaise/fatigue and weight loss.       Weight gain   HENT: Negative for hearing loss, sore throat and tinnitus.   Eyes: Negative for blurred vision and double vision.  Respiratory: Positive for cough, shortness of breath and wheezing. Negative for hemoptysis, sputum production and stridor.   Cardiovascular: Negative for chest pain, palpitations, orthopnea, leg swelling and PND.  Gastrointestinal: Positive for heartburn. Negative for abdominal pain, constipation, diarrhea, nausea and vomiting.  Genitourinary: Negative for dysuria, hematuria and urgency.  Musculoskeletal: Negative for joint pain and myalgias.  Skin: Negative for itching and rash.  Neurological: Negative for dizziness, tingling, weakness and headaches.  Endo/Heme/Allergies: Negative for environmental allergies. Does not bruise/bleed easily.  Psychiatric/Behavioral: Negative for depression. The patient is not nervous/anxious and does not have insomnia.   All other systems reviewed and are negative.    Objective:  Physical Exam Vitals signs reviewed.  Constitutional:      General: He is not in acute distress.    Appearance: He is well-developed.  HENT:     Head: Normocephalic and atraumatic.  Eyes:     General: No scleral icterus.    Conjunctiva/sclera: Conjunctivae normal.     Pupils: Pupils are equal, round, and reactive to light.  Neck:     Musculoskeletal:  Neck supple.     Vascular: No JVD.     Trachea: No tracheal deviation.  Cardiovascular:     Rate and Rhythm: Normal rate and regular rhythm.     Heart sounds: Normal heart sounds. No murmur.  Pulmonary:     Effort: Pulmonary effort is normal. No tachypnea, accessory muscle usage or respiratory distress.     Breath sounds: Normal breath sounds. No stridor. No wheezing, rhonchi or rales.  Abdominal:     General: Bowel sounds are normal. There is no distension.     Palpations: Abdomen is soft.     Tenderness: There is no abdominal tenderness.     Comments: Obese pannus  Musculoskeletal:        General: No tenderness.  Lymphadenopathy:     Cervical: No cervical adenopathy.  Skin:    General: Skin is warm and dry.     Capillary Refill: Capillary refill takes less than 2 seconds.     Findings: No rash.  Neurological:     Mental Status: He is alert and oriented to person, place, and time.  Psychiatric:        Behavior: Behavior normal.      Vitals:   11/01/18 1416  BP: 134/82  Pulse: 93  SpO2: 94%  Weight: 256 lb 6.4 oz (116.3 kg)  Height: 5\' 7"  (1.702 m)   94% on RA BMI Readings from Last 3 Encounters:  11/01/18 40.16 kg/m  11/30/17 40.54 kg/m  01/06/17 39.38 kg/m   Wt Readings from Last 3 Encounters:  11/01/18 256 lb 6.4 oz (116.3 kg)  11/30/17 251 lb 3.2 oz (113.9 kg)  01/06/17 244 lb (110.7 kg)    CBC    Component Value Date/Time   WBC 5.3 01/08/2011 0947   RBC 5.02 01/08/2011 0947   HGB 14.9 07/05/2014 1252   HCT 45.3 01/08/2011 0947   PLT 224 01/08/2011 0947   MCV 90.2 01/08/2011 0947   MCH 31.3 01/08/2011 0947   MCHC 34.7 01/08/2011 0947   RDW 13.5 01/08/2011 0947   LYMPHSABS 1.5 01/08/2011 0947   MONOABS 0.5 01/08/2011 0947   EOSABS 0.2 01/08/2011 0947   BASOSABS 0.0 01/08/2011 0947    Chest Imaging: No recent chest imaging   Pulmonary Functions Testing Results:No flowsheet data found.  FeNO: None   Pathology: None   Echocardiogram:  None   Heart Catheterization: None     Assessment & Plan:   Cough - Plan: Pulmonary function test, POCT EXHALED NITRIC OXIDE, CBC w/Diff, Resp Allergy Profile Regn2DC [facility]  [facility], IgG, IgA, IgM, CANCELED: POCT EXHALED NITRIC OXIDE  Wheezing  Gastroesophageal reflux disease without esophagitis  BMI 40.0-44.9, adult (HCC)  H/O multiple allergies  Discussion:  This is a 57 year old gentleman with a history of atopic disease.  Prior history of allergic reaction to medications.  Hayfever as well as dust mite allergies in the past.  Currently managed on Singulair and daily antihistamines.  Presenting to the pulmonary clinic with persistent cough over the past year.  Occasionally productive.  Has been on prednisone in the past as well as albuterol.  Both of which help symptoms.  Of note patient had a uncle with immunoglobulin deficiency.  His history is consistent with the possible underlying diagnosis of asthma.  He has not had PFTs before.  No significant smoking history.  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  Today we will recommend the following: Obtain blood work to include CBC with differential for eosinophil count, respiratory allergy panel, IgE, immunoglobulin levels. We will obtain FeNO today in office. (FENO 28) We will obtain pulmonary function tests upon return to the office. Start patient on Symbicort 160, 2 puffs twice daily Recommend starting omeprazole 20 mg daily for reflux symptoms  Return to office in 8 weeks.  Greater than 50% of this patient 60-minute office visit was spent face-to-face counseling above recommendations treatment plan.   Current Outpatient Medications:  .  aspirin 325 MG tablet, Take 325 mg by mouth daily., Disp: , Rfl:  .  cetirizine (ZYRTEC) 10 MG tablet, Take 10 mg by mouth daily., Disp: , Rfl:  .  metoprolol succinate (TOPROL-XL) 50  MG 24 hr tablet, TAKE 1 & 1/2 TABLETS BY MOUTH ONCE A DAY., Disp: 135 tablet,  Rfl: 3 .  montelukast (SINGULAIR) 10 MG tablet, Take one tablet once daily as directed, Disp: 30 tablet, Rfl: 5 .  Multiple Vitamin (MULTI VITAMIN DAILY PO), Take by mouth., Disp: , Rfl:  .  simvastatin (ZOCOR) 20 MG tablet, Take 1 tablet (20 mg total) by mouth daily at 6 PM., Disp: 90 tablet, Rfl: 3   Garner Nash, DO  Pulmonary Critical Care 11/01/2018 2:58 PM

## 2018-11-02 LAB — RESPIRATORY ALLERGY PROFILE REGION II ~~LOC~~
ALLERGEN, OAK, T7: 0.45 kU/L — AB
Allergen, A. alternata, m6: <0.1 kU/L
Allergen, Cedar tree, t12: <0.1 kU/L
Allergen, Comm Silver Birch, t9: 4 kU/L — ABNORMAL HIGH
Allergen, Cottonwood, t14: <0.1 kU/L
Allergen, D pternoyssinus,d7: <0.1 kU/L
Allergen, Mouse Urine Protein, e78: <0.1 kU/L
Allergen, Mulberry, t76: <0.1 kU/L
Allergen, P. notatum, m1: <0.1 kU/L
Bermuda Grass: <0.1 kU/L
Box Elder IgE: <0.1 kU/L
CLADOSPORIUM HERBARUM (M2) IGE: <0.1 kU/L
CLASS: 0
CLASS: 0
CLASS: 0
COMMON RAGWEED (SHORT) (W1) IGE: 0.29 kU/L — ABNORMAL HIGH
Cat Dander: <0.1 kU/L
Class: 0
Class: 0
Class: 0
Class: 0
Class: 0
Class: 0
Class: 0
Class: 0
Class: 0
Class: 0
Class: 0
Class: 0
Class: 0
Class: 0
Class: 0
Class: 0
Class: 0
Class: 0
Class: 1
Class: 2
Class: 3
Cockroach: 1.29 kU/L — ABNORMAL HIGH
D. farinae: <0.1 kU/L
Dog Dander: <0.1 kU/L
Elm IgE: 0.15 kU/L — ABNORMAL HIGH
IgE (Immunoglobulin E), Serum: 100 kU/L (ref <=114)
Johnson Grass: <0.1 kU/L
Pecan/Hickory Tree IgE: 0.17 kU/L — ABNORMAL HIGH
ROUGH PIGWEED IGE: 0.22 kU/L — AB
Sheep Sorrel IgE: <0.1 kU/L

## 2018-11-02 LAB — IGG, IGA, IGM
IgG (Immunoglobin G), Serum: 1360 mg/dL (ref 600–1640)
IgM, Serum: 31 mg/dL — ABNORMAL LOW (ref 50–300)
Immunoglobulin A: 333 mg/dL — ABNORMAL HIGH (ref 47–310)

## 2018-11-02 LAB — INTERPRETATION:

## 2018-11-03 ENCOUNTER — Telehealth: Payer: Self-pay | Admitting: Pulmonary Disease

## 2018-11-03 NOTE — Telephone Encounter (Signed)
Attempted to call pt but unable to reach him. Left message for pt to return call. 

## 2018-11-04 NOTE — Telephone Encounter (Signed)
Garner Nash, DO  Philipp Ovens, [place], [place], you can let him know that he is allergic to several items listed above. His immunoglobulin G and A levels are normal. His IgM is low but not considered deficit. His IgM is low because his IgE is elevated and can be seen in patients with asthma and allergic diseases.   Thanks,   BLI   Garner Nash, DO  [facility]  11/02/2018 2:50 PM    Pt is aware of results and voiced his understanding.  Nothing further is needed.

## 2018-11-23 ENCOUNTER — Ambulatory Visit: Payer: 59 | Admitting: Pulmonary Disease

## 2018-11-23 ENCOUNTER — Encounter: Payer: Self-pay | Admitting: Pulmonary Disease

## 2018-11-23 VITALS — BP 140/84 | HR 84 | Wt 252.8 lb

## 2018-11-23 DIAGNOSIS — R0683 Snoring: Secondary | ICD-10-CM

## 2018-11-23 NOTE — Progress Notes (Signed)
Terry Kim    [id number]    [birth year]  Primary Care Physician:Kim, Terry Haste (Inactive)  Referring Physician: No referring provider defined for this encounter.  Chief complaint:   Patient with a history of snoring, daytime sleepiness  HPI:  Patient follows up with Dr. Valeta Kim Concern for obstructive sleep apnea Patient describes sleep onset and sleep maintenance insomnia, significant snoring, witnessed apneas Work schedule is usually 12-hour shifts from 11-11 Usually tries to go to bed by 1 AM on workdays, may take him up to an hour or more to fall asleep sometimes Wakes up a few times during the night sometimes to use the bathroom and sometimes from pain in his knee  Has a history of atrial fibrillation which is well controlled  He is obese Never smoker  Denies any family history of obstructive sleep apnea Denies daytime headaches Denies headaches in the morning His memory is fine  No night sweats Outpatient Encounter Medications as of 11/23/2018  Medication Sig  . aspirin 325 MG tablet Take 325 mg by mouth daily.  . budesonide-formoterol (SYMBICORT) 160-4.5 MCG/ACT inhaler Inhale 2 puffs into the lungs every 12 (twelve) hours.  . cetirizine (ZYRTEC) 10 MG tablet Take 10 mg by mouth daily.  . metoprolol succinate (TOPROL-XL) 50 MG 24 hr tablet TAKE 1 & 1/2 TABLETS BY MOUTH ONCE A DAY.  . montelukast (SINGULAIR) 10 MG tablet Take one tablet once daily as directed  . Multiple Vitamin (MULTI VITAMIN DAILY PO) Take by mouth.  . simvastatin (ZOCOR) 20 MG tablet Take 1 tablet (20 mg total) by mouth daily at 6 PM.   No facility-administered encounter medications on file as of 11/23/2018.     Allergies as of 11/23/2018 - Review Complete 11/23/2018  Allergen Reaction Noted  . Penicillins Hives 05/04/2014  . Skelaxin [metaxalone] Itching 05/04/2014  . Doxycycline Rash 09/22/2016    Past Medical History:  Diagnosis Date  . Hypertension   . Obesity   . Persistent  atrial fibrillation    CHADS2VASC score is 1  . Pure hypercholesterolemia   . Supraventricular premature beats     Past Surgical History:  Procedure Laterality Date  . KNEE ARTHROSCOPY WITH MEDIAL MENISECTOMY Left 07/05/2014   Procedure: LEFT ARTHROSCOPY KNEE WITH MEDIAL MENISECTOMY/DEBRIDEMENT/SHAVING (CHONDROPLASTY)/ARTHROSCOPY KNEE ABRASION ARTHROPLASTY/MULTIPLE DRILLING;  Surgeon: Terry Kim., MD;  Location: [facility];  Service: Orthopedics;  Laterality: Left;  . SHOULDER ARTHROSCOPY  2000   right  . SHOULDER ARTHROSCOPY W/ ROTATOR CUFF REPAIR  2012   left  . TONSILLECTOMY      Family History  Problem Relation Age of Onset  . Hypertension Mother   . Eczema Father     Social History   Socioeconomic History  . Marital status: Divorced    Spouse name: Not on file  . Number of children: Not on file  . Years of education: Not on file  . Highest education level: Not on file  Occupational History  . Not on file  Social Needs  . Financial resource strain: Not on file  . Food insecurity:    Worry: Not on file    Inability: Not on file  . Transportation needs:    Medical: Not on file    Non-medical: Not on file  Tobacco Use  . Smoking status: Never Smoker  . Smokeless tobacco: Never Used  Substance and Sexual Activity  . Alcohol use: Yes    Comment: most days 4xweek  .  Drug use: No  . Sexual activity: Not on file  Lifestyle  . Physical activity:    Days per week: Not on file    Minutes per session: Not on file  . Stress: Not on file  Relationships  . Social connections:    Talks on phone: Not on file    Gets together: Not on file    Attends religious service: Not on file    Active member of club or organization: Not on file    Attends meetings of clubs or organizations: Not on file    Relationship status: Not on file  . Intimate partner violence:    Fear of current or ex partner: Not on file    Emotionally abused: Not on file    Physically  abused: Not on file    Forced sexual activity: Not on file  Other Topics Concern  . Not on file  Social History Narrative  . Not on file    Review of Systems  Constitutional: Negative.   HENT: Positive for congestion.   Eyes: Negative.   Respiratory: Positive for apnea.   Cardiovascular: Negative.   Gastrointestinal: Negative.   Psychiatric/Behavioral: Positive for sleep disturbance.    Vitals:   11/23/18 1016  BP: 140/84  Pulse: 84  SpO2: 94%     Physical Exam  Constitutional: He appears well-developed and well-nourished.  HENT:  Head: Normocephalic and atraumatic.  Crowded oropharynx, Mallampati 4  Eyes: Pupils are equal, round, and reactive to light. Conjunctivae are normal. Right eye exhibits no discharge. Left eye exhibits no discharge.  Neck: Normal range of motion. Neck supple. No tracheal deviation present. No thyromegaly present.  Cardiovascular: Normal rate, regular rhythm and normal heart sounds.  Pulmonary/Chest: Effort normal and breath sounds normal. No respiratory distress. He has no wheezes.  Abdominal: Soft. Bowel sounds are normal. He exhibits no distension. There is no abdominal tenderness. There is no rebound.    Results of the Epworth flowsheet 11/23/2018  Sitting and reading 2  Watching TV 2  Sitting, inactive in a public place (e.g. a theatre or a meeting) 2  As a passenger in a car for an hour without a break 3  Lying down to rest in the afternoon when circumstances permit 3  Sitting and talking to someone 0  Sitting quietly after a lunch without alcohol 2  In a car, while stopped for a few minutes in traffic 0  Total score 14    Data Reviewed: Records reviewed  Assessment:  Moderate probability of significant obstructive sleep apnea -Obesity, witnessed apneas, heavy snoring  Excessive daytime sleepiness with an Epworth Sleepiness Scale of 14  Insomnia related to obstructive sleep apnea  Plan/Recommendations:  We will schedule the  patient for home sleep study Pathophysiology of sleep disordered breathing discussed with the patient Treatment options reviewed with the patient  Encouraged to continue to get adequate number of hours of sleep Exercise and weight loss discussed  I will see him back in the office in about 3 months   Sherrilyn Rist MD [facility] 11/23/2018, 10:23 AM  CC: No ref. provider found

## 2018-11-23 NOTE — Patient Instructions (Addendum)
Moderate probability of obstructive sleep apnea   We will set you up with a home sleep study Treatment options as discussed  I will see you back in the office in about 3 months Sleep Apnea Sleep apnea affects breathing during sleep. It causes breathing to stop for a short time or to become shallow. It can also increase the risk of:  Heart attack.  Stroke.  Being very overweight (obese).  Diabetes.  Heart failure.  Irregular heartbeat. The goal of treatment is to help you breathe normally again. What are the causes? There are three kinds of sleep apnea:  Obstructive sleep apnea. This is caused by a blocked or collapsed airway.  Central sleep apnea. This happens when the brain does not send the right signals to the muscles that control breathing.  Mixed sleep apnea. This is a combination of obstructive and central sleep apnea. The most common cause of this condition is a collapsed or blocked airway. This can happen if:  Your throat muscles are too relaxed.  Your tongue and tonsils are too large.  You are overweight.  Your airway is too small. What increases the risk?  Being overweight.  Smoking.  Having a small airway.  Being older.  Being male.  Drinking alcohol.  Taking medicines to calm yourself (sedatives or tranquilizers).  Having family members with the condition. What are the signs or symptoms?  Trouble staying asleep.  Being sleepy or tired during the day.  Getting angry a lot.  Loud snoring.  Headaches in the morning.  Not being able to focus your mind (concentrate).  Forgetting things.  Less interest in sex.  Mood swings.  Personality changes.  Feelings of sadness (depression).  Waking up a lot during the night to pee (urinate).  Dry mouth.  Sore throat. How is this diagnosed?  Your medical history.  A physical exam.  A test that is done when you are sleeping (sleep study). The test is most often done in a sleep lab  but may also be done at home. How is this treated?   Sleeping on your side.  Using a medicine to get rid of mucus in your nose (decongestant).  Avoiding the use of alcohol, medicines to help you relax, or certain pain medicines (narcotics).  Losing weight, if needed.  Changing your diet.  Not smoking.  Using a machine to open your airway while you sleep, such as: ? An oral appliance. This is a mouthpiece that shifts your lower jaw forward. ? A CPAP device. This device blows air through a mask when you breathe out (exhale). ? An EPAP device. This has valves that you put in each nostril. ? A BPAP device. This device blows air through a mask when you breathe in (inhale) and breathe out.  Having surgery if other treatments do not work. It is important to get treatment for sleep apnea. Without treatment, it can lead to:  High blood pressure.  Coronary artery disease.  In men, not being able to have an erection (impotence).  Reduced thinking ability. Follow these instructions at home: Lifestyle  Make changes that your doctor recommends.  Eat a healthy diet.  Lose weight if needed.  Avoid alcohol, medicines to help you relax, and some pain medicines.  Do not use any products that contain nicotine or tobacco, such as cigarettes, e-cigarettes, and chewing tobacco. If you need help quitting, ask your doctor. General instructions  Take over-the-counter and prescription medicines only as told by your doctor.  If  you were given a machine to use while you sleep, use it only as told by your doctor.  If you are having surgery, make sure to tell your doctor you have sleep apnea. You may need to bring your device with you.  Keep all follow-up visits as told by your doctor. This is important. Contact a doctor if:  The machine that you were given to use during sleep bothers you or does not seem to be working.  You do not get better.  You get worse. Get help right away  if:  Your chest hurts.  You have trouble breathing in enough air.  You have an uncomfortable feeling in your back, arms, or stomach.  You have trouble talking.  One side of your body feels weak.  A part of your face is hanging down. These symptoms may be an emergency. Do not wait to see if the symptoms will go away. Get medical help right away. Call your local emergency services (911 in the [place] Do not drive yourself to the hospital. Summary  This condition affects breathing during sleep.  The most common cause is a collapsed or blocked airway.  The goal of treatment is to help you breathe normally while you sleep. This information is not intended to replace advice given to you by your health care provider. Make sure you discuss any questions you have with your health care provider. Document Released: 08/12/2008 Document Revised: 06/29/2018 Document Reviewed: 06/29/2018 Elsevier Interactive Patient Education  [employer].

## 2018-11-24 DIAGNOSIS — E7800 Pure hypercholesterolemia, unspecified: Secondary | ICD-10-CM | POA: Diagnosis not present

## 2018-11-24 DIAGNOSIS — J45909 Unspecified asthma, uncomplicated: Secondary | ICD-10-CM | POA: Diagnosis not present

## 2018-11-24 DIAGNOSIS — Z0000 Encounter for general adult medical examination without abnormal findings: Secondary | ICD-10-CM | POA: Diagnosis not present

## 2018-11-24 DIAGNOSIS — M791 Myalgia, unspecified site: Secondary | ICD-10-CM | POA: Diagnosis not present

## 2018-11-24 DIAGNOSIS — Z23 Encounter for immunization: Secondary | ICD-10-CM | POA: Diagnosis not present

## 2018-11-24 MED FILL — PROAIR HFA 90 MCG INHALER: 108 (90 BAS | 75 days supply | Qty: 26 | Fill #0

## 2018-11-24 MED FILL — MONTELUKAST SOD 10 MG TAB: 10 | 90 days supply | Qty: 90 | Fill #4

## 2018-11-24 MED FILL — ESOMEPRAZOLE MAG DR 40 MG C: 40 | 90 days supply | Qty: 90 | Fill #0

## 2018-12-16 DIAGNOSIS — R7303 Prediabetes: Secondary | ICD-10-CM | POA: Diagnosis not present

## 2018-12-16 DIAGNOSIS — R945 Abnormal results of liver function studies: Secondary | ICD-10-CM | POA: Diagnosis not present

## 2018-12-21 DIAGNOSIS — R0683 Snoring: Secondary | ICD-10-CM

## 2018-12-21 DIAGNOSIS — G4733 Obstructive sleep apnea (adult) (pediatric): Secondary | ICD-10-CM | POA: Diagnosis not present

## 2018-12-23 DIAGNOSIS — G4733 Obstructive sleep apnea (adult) (pediatric): Secondary | ICD-10-CM | POA: Diagnosis not present

## 2018-12-24 ENCOUNTER — Telehealth: Payer: Self-pay | Admitting: Pulmonary Disease

## 2018-12-24 DIAGNOSIS — G4733 Obstructive sleep apnea (adult) (pediatric): Secondary | ICD-10-CM

## 2018-12-24 NOTE — Telephone Encounter (Signed)
Dr. Ander Slade has reviewed the home sleep test this showed severe sleep apnea with severe oxygen desaturations .   Recommendations   In Lab titration due to Severe osa and oxygen desaturations  Weight loss measures    Advise against driving while sleepy & against medication with sedative side effects.      Lmom

## 2018-12-27 ENCOUNTER — Telehealth: Payer: Self-pay

## 2018-12-27 NOTE — Telephone Encounter (Signed)
Call made to patient, per AVS at last OV patient was supposed to be scheduled for a PFT. PFT and OV offered, patient declined stating he would rather see Icard tomorrow because he has several questions and we could schedule/address the PFT at that time. Nothing further is needed at this time.

## 2018-12-28 ENCOUNTER — Encounter: Payer: Self-pay | Admitting: Pulmonary Disease

## 2018-12-28 ENCOUNTER — Ambulatory Visit: Payer: 59 | Admitting: Pulmonary Disease

## 2018-12-28 VITALS — BP 122/82 | HR 83 | Ht 66.0 in | Wt 242.2 lb

## 2018-12-28 DIAGNOSIS — K219 Gastro-esophageal reflux disease without esophagitis: Secondary | ICD-10-CM

## 2018-12-28 DIAGNOSIS — R059 Cough, unspecified: Secondary | ICD-10-CM

## 2018-12-28 DIAGNOSIS — T7849XA Other allergy, initial encounter: Secondary | ICD-10-CM

## 2018-12-28 DIAGNOSIS — R768 Other specified abnormal immunological findings in serum: Secondary | ICD-10-CM

## 2018-12-28 DIAGNOSIS — J4520 Mild intermittent asthma, uncomplicated: Secondary | ICD-10-CM | POA: Diagnosis not present

## 2018-12-28 DIAGNOSIS — Z6841 Body Mass Index (BMI) 40.0 and over, adult: Secondary | ICD-10-CM

## 2018-12-28 DIAGNOSIS — Z9189 Other specified personal risk factors, not elsewhere classified: Secondary | ICD-10-CM | POA: Diagnosis not present

## 2018-12-28 DIAGNOSIS — R05 Cough: Secondary | ICD-10-CM

## 2018-12-28 DIAGNOSIS — Z889 Allergy status to unspecified drugs, medicaments and biological substances status: Secondary | ICD-10-CM

## 2018-12-28 MED ORDER — BUDESONIDE-FORMOTEROL FUMARATE 160-4.5 MCG/ACT IN AERO: 2.0000 | INHALATION_SPRAY | Freq: Two times a day (BID) | RESPIRATORY_TRACT | 6 refills | Status: DC

## 2018-12-28 MED FILL — SYMBICORT 160-4.5 MCG INH: 160-4.5 | 30 days supply | Qty: 10 | Fill #0

## 2018-12-28 NOTE — Progress Notes (Signed)
Synopsis: Referred in December 2019 for productive cough.   Subjective:   PATIENT ID: Terry Kim GENDER: male DOB: 1961/07/24, MRN: [id number]  Chief Complaint  Patient presents with  . Follow-up    no wheezing, cough or Shortness of breathe. stop symbicort since ran out. Patient is feeling best he has in a few years.    PMH of htn, obesity. For the past year he has ongoing productive cough. Cough is usually worse with extreme changes of temperature. Hot to cold and cold to hot temp. Laughing makes him cough. Excessive talking will make him cough. Every other month he has has excessive symptoms and will see his PCP and get placed on antibiotics with steroids. All of the steroids (history of allergic reaction to doxycycline) that started with rash and was treated with steroids that happened for a period of a year and was being treated for epidymitis. He has a history of hay fever and allergies to dust mites. Given albuterol, benadryl and daily histamine. He has used hycodan cough syurp in the past too.  Over the last few years he has noticed worsening reflux.  Family history with a uncle who had immunoglobulin deficiency ultimately requiring lung transplantation.  OV 12/28/2017: Patient seen today in the office following initiation of antihistamines and antiallergy medications as well as inhaler regimen.  Patient states that he is currently back to his previous baseline.  He does feel like his symptoms are much better than where he was before.  He would like to try paring back some of the medications.  He did stop his Symbicort because he ran out of the sample and felt like he was doing better so he did not call to let us know or ask for a refill.  Otherwise has been doing well.  Patient denies ongoing cough, denies fevers chills night sweats weight loss or sputum production.  Denies hemoptysis.   Past Medical History:  Diagnosis Date  . Hypertension   . Obesity   . Persistent atrial  fibrillation    CHADS2VASC score is 1  . Pure hypercholesterolemia   . Supraventricular premature beats      Family History  Problem Relation Age of Onset  . Hypertension Mother   . Eczema Father      Past Surgical History:  Procedure Laterality Date  . KNEE ARTHROSCOPY WITH MEDIAL MENISECTOMY Left 07/05/2014   Procedure: LEFT ARTHROSCOPY KNEE WITH MEDIAL MENISECTOMY/DEBRIDEMENT/SHAVING (CHONDROPLASTY)/ARTHROSCOPY KNEE ABRASION ARTHROPLASTY/MULTIPLE DRILLING;  Surgeon: Yvette Rack., MD;  Location: [facility];  Service: Orthopedics;  Laterality: Left;  . SHOULDER ARTHROSCOPY  2000   right  . SHOULDER ARTHROSCOPY W/ ROTATOR CUFF REPAIR  2012   left  . TONSILLECTOMY      Social History   Socioeconomic History  . Marital status: Divorced    Spouse name: Not on file  . Number of children: Not on file  . Years of education: Not on file  . Highest education level: Not on file  Occupational History  . Not on file  Social Needs  . Financial resource strain: Not on file  . Food insecurity:    Worry: Not on file    Inability: Not on file  . Transportation needs:    Medical: Not on file    Non-medical: Not on file  Tobacco Use  . Smoking status: Never Smoker  . Smokeless tobacco: Never Used  Substance and Sexual Activity  . Alcohol use: Yes    Comment: most  days 4xweek  . Drug use: No  . Sexual activity: Not on file  Lifestyle  . Physical activity:    Days per week: Not on file    Minutes per session: Not on file  . Stress: Not on file  Relationships  . Social connections:    Talks on phone: Not on file    Gets together: Not on file    Attends religious service: Not on file    Active member of club or organization: Not on file    Attends meetings of clubs or organizations: Not on file    Relationship status: Not on file  . Intimate partner violence:    Fear of current or ex partner: Not on file    Emotionally abused: Not on file    Physically  abused: Not on file    Forced sexual activity: Not on file  Other Topics Concern  . Not on file  Social History Narrative  . Not on file     Allergies  Allergen Reactions  . Penicillins Hives  . Skelaxin [Metaxalone] Itching  . Doxycycline Rash     Outpatient Medications Prior to Visit  Medication Sig Dispense Refill  . aspirin 325 MG tablet Take 325 mg by mouth daily.    . cetirizine (ZYRTEC) 10 MG tablet Take 10 mg by mouth daily.    Marland Kitchen esomeprazole (NEXIUM) 40 MG capsule Take 1 capsule by mouth daily.    . metoprolol succinate (TOPROL-XL) 50 MG 24 hr tablet TAKE 1 & 1/2 TABLETS BY MOUTH ONCE A DAY. 135 tablet 3  . montelukast (SINGULAIR) 10 MG tablet Take one tablet once daily as directed 30 tablet 5  . Multiple Vitamin (MULTI VITAMIN DAILY PO) Take by mouth.    Marland Kitchen PROAIR HFA 108 (90 Base) MCG/ACT inhaler     . simvastatin (ZOCOR) 20 MG tablet Take 1 tablet (20 mg total) by mouth daily at 6 PM. 90 tablet 3  . budesonide-formoterol (SYMBICORT) 160-4.5 MCG/ACT inhaler Inhale 2 puffs into the lungs every 12 (twelve) hours. (Patient not taking: Reported on 12/28/2018) 1 Inhaler 0   No facility-administered medications prior to visit.     Review of Systems  Constitutional: Negative for chills, fever, malaise/fatigue and weight loss.  HENT: Negative for hearing loss, sore throat and tinnitus.   Eyes: Negative for blurred vision and double vision.  Respiratory: Negative for cough, hemoptysis, sputum production, shortness of breath, wheezing and stridor.   Cardiovascular: Negative for chest pain, palpitations, orthopnea, leg swelling and PND.  Gastrointestinal: Negative for abdominal pain, constipation, diarrhea, heartburn, nausea and vomiting.  Genitourinary: Negative for dysuria, hematuria and urgency.  Musculoskeletal: Negative for joint pain and myalgias.  Skin: Negative for itching and rash.  Neurological: Negative for dizziness, tingling, weakness and headaches.    Endo/Heme/Allergies: Negative for environmental allergies. Does not bruise/bleed easily.  Psychiatric/Behavioral: Negative for depression. The patient is not nervous/anxious and does not have insomnia.   All other systems reviewed and are negative.    Objective:  Physical Exam Vitals signs reviewed.  Constitutional:      General: He is not in acute distress.    Appearance: He is well-developed. He is obese.  HENT:     Head: Normocephalic and atraumatic.  Eyes:     General: No scleral icterus.    Conjunctiva/sclera: Conjunctivae normal.     Pupils: Pupils are equal, round, and reactive to light.  Neck:     Musculoskeletal: Neck supple.     Vascular:  No JVD.     Trachea: No tracheal deviation.  Cardiovascular:     Rate and Rhythm: Normal rate and regular rhythm.     Heart sounds: Normal heart sounds. No murmur.  Pulmonary:     Effort: Pulmonary effort is normal. No tachypnea, accessory muscle usage or respiratory distress.     Breath sounds: Normal breath sounds. No stridor. No wheezing, rhonchi or rales.  Abdominal:     General: Bowel sounds are normal. There is no distension.     Palpations: Abdomen is soft.     Tenderness: There is no abdominal tenderness.  Musculoskeletal:        General: No tenderness.  Lymphadenopathy:     Cervical: No cervical adenopathy.  Skin:    General: Skin is warm and dry.     Capillary Refill: Capillary refill takes less than 2 seconds.     Findings: No rash.  Neurological:     Mental Status: He is alert and oriented to person, place, and time.  Psychiatric:        Behavior: Behavior normal.      Vitals:   12/28/18 0946  BP: 122/82  Pulse: 83  SpO2: 94%  Weight: 242 lb 3.2 oz (109.9 kg)  Height: 5\' 6"  (1.676 m)   94% on RA BMI Readings from Last 3 Encounters:  12/28/18 39.09 kg/m  11/23/18 39.59 kg/m  11/01/18 40.16 kg/m   Wt Readings from Last 3 Encounters:  12/28/18 242 lb 3.2 oz (109.9 kg)  11/23/18 252 lb 12.8 oz  (114.7 kg)  11/01/18 256 lb 6.4 oz (116.3 kg)    CBC    Component Value Date/Time   WBC 5.3 11/01/2018 1523   RBC 4.95 11/01/2018 1523   HGB 15.4 11/01/2018 1523   HCT 45.8 11/01/2018 1523   PLT 223.0 11/01/2018 1523   MCV 92.7 11/01/2018 1523   MCH 31.3 01/08/2011 0947   MCHC 33.7 11/01/2018 1523   RDW 14.6 11/01/2018 1523   LYMPHSABS 1.6 11/01/2018 1523   MONOABS 0.5 11/01/2018 1523   EOSABS 0.1 11/01/2018 1523   BASOSABS 0.1 11/01/2018 1523    Chest Imaging: No recent chest imaging   Pulmonary Functions Testing Results:No flowsheet data found.  FeNO: None   Pathology: None   Echocardiogram: None   Heart Catheterization: None     Assessment & Plan:   Cough - Plan: Pulmonary Function Test  Gastroesophageal reflux disease without esophagitis  BMI 40.0-44.9, adult (HCC)  H/O multiple allergies  Positive RAST testing  Elevated IgE level  Mild intermittent asthma without complication  Discussion:  57 year old gentleman with history of atopic disease, prior allergies, prior history of hayfever.  Lab results with positive regional allergy panel as well as elevated IgE.  Would recommend continuation of his current Singulair.  If he would like to come off of his antihistamine I think it is possible however I do suspect that his symptoms may have a high chance of recurrence.  Patient is concerned about being on long-term medication management.  He is currently not using an inhaler.  I do recommend that he likely go back on use of Symbicort if his symptoms recur.  I suspect that he may do okay with as needed use of Symbicort.  I do understand this does not carry an FDA indication for as needed use for the management of asthma here in the [place] however there is [place] data to suggest for mild intermittent disease that as needed Symbicort use is just as  effective.  This was discussed with the patient today in the office.  Patient to have pulmonary function test completed.   At the return of next visit. Patient to return to clinic in 4 months with full pulmonary function tests.  Greater than 50% of this patient's 25-minute visit was spent face-to-face discussing the above recommendations and treatment plan as well as stepwise approach in management of mild intermittent asthma.   Current Outpatient Medications:  .  aspirin 325 MG tablet, Take 325 mg by mouth daily., Disp: , Rfl:  .  cetirizine (ZYRTEC) 10 MG tablet, Take 10 mg by mouth daily., Disp: , Rfl:  .  esomeprazole (NEXIUM) 40 MG capsule, Take 1 capsule by mouth daily., Disp: , Rfl:  .  metoprolol succinate (TOPROL-XL) 50 MG 24 hr tablet, TAKE 1 & 1/2 TABLETS BY MOUTH ONCE A DAY., Disp: 135 tablet, Rfl: 3 .  montelukast (SINGULAIR) 10 MG tablet, Take one tablet once daily as directed, Disp: 30 tablet, Rfl: 5 .  Multiple Vitamin (MULTI VITAMIN DAILY PO), Take by mouth., Disp: , Rfl:  .  PROAIR HFA 108 (90 Base) MCG/ACT inhaler, , Disp: , Rfl:  .  simvastatin (ZOCOR) 20 MG tablet, Take 1 tablet (20 mg total) by mouth daily at 6 PM., Disp: 90 tablet, Rfl: 3 .  budesonide-formoterol (SYMBICORT) 160-4.5 MCG/ACT inhaler, Inhale 2 puffs into the lungs every 12 (twelve) hours. (Patient not taking: Reported on 12/28/2018), Disp: 1 Inhaler, Rfl: 0   Garner Nash, DO [facility] 12/28/2018 10:02 AM

## 2018-12-28 NOTE — Patient Instructions (Addendum)
Thank you for visiting Dr. Valeta Harms at [facility]. Today we recommend the following:  Meds ordered this encounter  Medications  . budesonide-formoterol (SYMBICORT) 160-4.5 MCG/ACT inhaler    Sig: Inhale 2 puffs into the lungs 2 (two) times daily.    Dispense:  1 Inhaler    Refill:  6   Continue PPI, continue singulair, can trial off antihistamine but is symptoms recurr would consider starting again.   Return in about 4 months (around 04/28/2019). to see me in clinic.  Please schedule PFTS for next available. We will call patient with results.

## 2018-12-30 NOTE — Telephone Encounter (Signed)
lmom 

## 2019-01-03 ENCOUNTER — Ambulatory Visit (INDEPENDENT_AMBULATORY_CARE_PROVIDER_SITE_OTHER): Payer: 59 | Admitting: Pulmonary Disease

## 2019-01-03 DIAGNOSIS — R05 Cough: Secondary | ICD-10-CM

## 2019-01-03 DIAGNOSIS — R059 Cough, unspecified: Secondary | ICD-10-CM

## 2019-01-03 LAB — PULMONARY FUNCTION TEST
DL/VA % pred: 131 %
DL/VA: 5.8 ml/min/mmHg/L
DLCO unc % pred: 126 %
DLCO unc: 30.53 ml/min/mmHg
FEF 25-75 Post: 3.82 L/sec
FEF 25-75 Pre: 3.4 L/sec
FEF2575-%Change-Post: 12 %
FEF2575-%Pred-Post: 140 %
FEF2575-%Pred-Pre: 124 %
FEV1-%Change-Post: 7 %
FEV1-%Pred-Post: 105 %
FEV1-%Pred-Pre: 97 %
FEV1-Post: 3.29 L
FEV1-Pre: 3.05 L
FEV1FVC-%Change-Post: 4 %
FEV1FVC-%Pred-Pre: 109 %
FEV6-%Change-Post: 7 %
FEV6-%PRED-PRE: 90 %
FEV6-%Pred-Post: 96 %
FEV6-POST: 3.78 L
FEV6-Pre: 3.51 L
FEV6FVC-%PRED-POST: 104 %
FEV6FVC-%Pred-Pre: 104 %
FVC-%Change-Post: 3 %
FVC-%Pred-Post: 92 %
FVC-%Pred-Pre: 89 %
FVC-Post: 3.78 L
FVC-Pre: 3.65 L
Post FEV1/FVC ratio: 87 %
Post FEV6/FVC ratio: 100 %
Pre FEV1/FVC ratio: 84 %
Pre FEV6/FVC Ratio: 100 %
RV % pred: 93 %
RV: 1.77 L
TLC % pred: 94 %
TLC: 5.66 L

## 2019-01-03 NOTE — Progress Notes (Signed)
Cardiology Office Note:    Date:  01/04/2019   ID:  Terry Kim, DOB 1961/09/26, MRN [id number]  PCP:  Melinda Crutch (Inactive)  Cardiologist:  No primary care provider on file.    Referring MD: No ref. provider found   Chief Complaint  Patient presents with  . Atrial Fibrillation  . Hypertension  . Hyperlipidemia    History of Present Illness:    Terry Kim is a 58 y.o. male with a hx of Persistent atrial fibrillation (CHADS2VASC score 1), HTN, dyslipidemia and obesity. He is here today for followup and is doing well.  he denies any chest pain or pressure, SOB, DOE, PND, orthopnea, LE edema, dizziness, palpitations or syncope. He is compliant with his meds and is tolerating meds with no SE.  Unfortunately he was dx with asthma this year and has been working with pulmonary and is now off steroids and on inhalers.  He has lost some weight and is now motivated to exercise more and lose more weight.  His BP has been running elevated recently and his HbA1C was recently checked and was 6.9.  His last LDL was 111 and HDL37.    Past Medical History:  Diagnosis Date  . Hypertension   . Obesity   . Persistent atrial fibrillation    CHADS2VASC score is 1  . Pure hypercholesterolemia   . Supraventricular premature beats     Past Surgical History:  Procedure Laterality Date  . KNEE ARTHROSCOPY WITH MEDIAL MENISECTOMY Left 07/05/2014   Procedure: LEFT ARTHROSCOPY KNEE WITH MEDIAL MENISECTOMY/DEBRIDEMENT/SHAVING (CHONDROPLASTY)/ARTHROSCOPY KNEE ABRASION ARTHROPLASTY/MULTIPLE DRILLING;  Surgeon: Yvette Rack., MD;  Location: [place] [facility];  Service: Orthopedics;  Laterality: Left;  . SHOULDER ARTHROSCOPY  2000   right  . SHOULDER ARTHROSCOPY W/ ROTATOR CUFF REPAIR  2012   left  . TONSILLECTOMY      Current Medications: No outpatient medications have been marked as taking for the 01/04/19 encounter (Office Visit) with Sueanne Margarita, MD.     Allergies:   Penicillins;  Skelaxin [metaxalone]; and Doxycycline   Social History   Socioeconomic History  . Marital status: Divorced    Spouse name: Not on file  . Number of children: Not on file  . Years of education: Not on file  . Highest education level: Not on file  Occupational History  . Not on file  Social Needs  . Financial resource strain: Not on file  . Food insecurity:    Worry: Not on file    Inability: Not on file  . Transportation needs:    Medical: Not on file    Non-medical: Not on file  Tobacco Use  . Smoking status: Never Smoker  . Smokeless tobacco: Never Used  Substance and Sexual Activity  . Alcohol use: Yes    Comment: most days 4xweek  . Drug use: No  . Sexual activity: Not on file  Lifestyle  . Physical activity:    Days per week: Not on file    Minutes per session: Not on file  . Stress: Not on file  Relationships  . Social connections:    Talks on phone: Not on file    Gets together: Not on file    Attends religious service: Not on file    Active member of club or organization: Not on file    Attends meetings of clubs or organizations: Not on file    Relationship status: Not on file  Other Topics Concern  .  Not on file  Social History Narrative  . Not on file     Family History: The patient's family history includes Eczema in his father; Hypertension in his mother.  ROS:   Please see the history of present illness.    ROS  All other systems reviewed and negative.   EKGs/Labs/Other Studies Reviewed:    The following studies were reviewed today: none  EKG:  EKG is  ordered today.  The ekg ordered today demonstrates normal sinus rhythm 80 bpm with no ST changes.  Recent Labs: 11/01/2018: Hemoglobin 15.4; Platelets 223.0   Recent Lipid Panel    Component Value Date/Time   CHOL 159 06/05/2015 0848   TRIG 116.0 06/05/2015 0848   HDL 47.60 06/05/2015 0848   CHOLHDL 3 06/05/2015 0848   VLDL 23.2 06/05/2015 0848   LDLCALC 88 06/05/2015 0848     Physical Exam:    VS:  BP (!) 136/96     Wt Readings from Last 3 Encounters:  12/28/18 242 lb 3.2 oz (109.9 kg)  11/23/18 252 lb 12.8 oz (114.7 kg)  11/01/18 256 lb 6.4 oz (116.3 kg)     GEN:  Well nourished, well developed in no acute distress HEENT: Normal NECK: No JVD; No carotid bruits LYMPHATICS: No lymphadenopathy CARDIAC: RRR, no murmurs, rubs, gallops RESPIRATORY:  Clear to auscultation without rales, wheezing or rhonchi  ABDOMEN: Soft, non-tender, non-distended MUSCULOSKELETAL:  No edema; No deformity  SKIN: Warm and dry NEUROLOGIC:  Alert and oriented x 3 PSYCHIATRIC:  Normal affect   ASSESSMENT:    1. Persistent atrial fibrillation ([place])   2. Essential hypertension   3. Pure hypercholesterolemia    PLAN:    In order of problems listed above:  1.  Persistent atrial fibrillation - he is maintaining NSR on exam.  He would like to get off the BB due to his DM and his PCP recommended considering Carvedilol but this is not beta selective and may exacerbate his asthma.  Therefore I will change Toprol to Cardizem CD 240mg  daily.    His CHADS2VASC score is 1 and therefore not on anticoagulation.  He is on ASA full dose with no data in prevention of cardioembolic events in afib.  I have told him he can stop ASA which will only increase his risk of bleeding with no protection from CVA.   2.  HTN - BP is borderline controlled today. As above we are stopping Toprol due to DM and starting on Cardizem.  I have asked him to check his BP daily for a week and call with the results.   3.  Hyperlipidemia - his LDL was 111 on 11/24/2018 and triglycerides 126.  With DM his ideal LDL goal is < 70 but he was intolerant to Lipitor and has been on Zocor 20mg  daily which we can no longer use due to CCB initiation.  I am going to change him to Pravastatin 20mg  daily and repeat FLP and ALT in 6 weeks.  He also has been battling with elevated LFTs and has been dx with fatty liver a few years ago  with GI.  I have recommended that he get another appt with GI to make sure they are ok with continuing on statin therapy.  I am going to get a coronary Ca score to determine future CAD risk going forward in light of HTN, DM and hyperlipidemia.  Encourage him to try to avoid complex sugars and carbs and get into a routine exercise program.  Medication  Adjustments/Labs and Tests Ordered: Current medicines are reviewed at length with the patient today.  Concerns regarding medicines are outlined above.  No orders of the defined types were placed in this encounter.  No orders of the defined types were placed in this encounter.   Signed, Fransico Him, MD  01/04/2019 3:34 PM    [place]

## 2019-01-03 NOTE — Progress Notes (Signed)
Pt completed full PFT today. 

## 2019-01-04 ENCOUNTER — Ambulatory Visit: Payer: 59 | Admitting: Cardiology

## 2019-01-04 ENCOUNTER — Encounter: Payer: Self-pay | Admitting: Cardiology

## 2019-01-04 VITALS — BP 136/96 | HR 80 | Ht 66.0 in | Wt 234.8 lb

## 2019-01-04 DIAGNOSIS — I4819 Other persistent atrial fibrillation: Secondary | ICD-10-CM

## 2019-01-04 DIAGNOSIS — R945 Abnormal results of liver function studies: Secondary | ICD-10-CM

## 2019-01-04 DIAGNOSIS — E7800 Pure hypercholesterolemia, unspecified: Secondary | ICD-10-CM

## 2019-01-04 DIAGNOSIS — I10 Essential (primary) hypertension: Secondary | ICD-10-CM | POA: Diagnosis not present

## 2019-01-04 DIAGNOSIS — R7989 Other specified abnormal findings of blood chemistry: Secondary | ICD-10-CM

## 2019-01-04 MED ORDER — DILTIAZEM HCL ER COATED BEADS 240 MG PO CP24: 240.0000 mg | ORAL_CAPSULE | Freq: Every day | ORAL | 11 refills | Status: DC

## 2019-01-04 MED ORDER — PRAVASTATIN SODIUM 20 MG PO TABS: 20.0000 mg | ORAL_TABLET | Freq: Every evening | ORAL | 11 refills | Status: DC

## 2019-01-04 MED FILL — CARTIA XT 240 MG CAPSULE SA: 240 | 30 days supply | Qty: 30 | Fill #0

## 2019-01-04 MED FILL — PRAVASTATIN SODIUM 20 MG TA: 20 | 30 days supply | Qty: 30 | Fill #0 | Status: TO

## 2019-01-04 NOTE — Patient Instructions (Addendum)
Medication Instructions:  1) STOP TOPROL 2) START CARDIZEM 240 mg daily  3) STOP SIMVASTATIN 4) START PRAVASTATIN 20 mg daily  Labwork: Your provider recommends that you return for FASTING lab work in: 6 weeks (LFTs, lipids)    Testing/Procedures: Dr. Radford Pax recommends you have a CORONARY CALCIUM SCORE.  Follow-Up: Please call your GI doctor and schedule a follow-up appointment.  Your provider wants you to follow-up in: 1 year with Dr. Radford Pax. You will receive a reminder letter in the mail two months in advance. If you don't receive a letter, please call our office to schedule the follow-up appointment.    Please check your BLOOD PRESSURE daily for one week and call with results.

## 2019-01-05 NOTE — Addendum Note (Signed)
Addended by: Patterson Hammersmith A on: 01/05/2019 05:03 PM   Modules accepted: Orders

## 2019-01-10 NOTE — Telephone Encounter (Signed)
lmom 

## 2019-01-11 NOTE — Telephone Encounter (Signed)
Patient is aware of results and order placed nothing further needed at this time.

## 2019-01-13 ENCOUNTER — Telehealth: Payer: Self-pay | Admitting: Cardiology

## 2019-01-13 MED ORDER — DILTIAZEM HCL ER COATED BEADS 300 MG PO CP24: 300.0000 mg | ORAL_CAPSULE | Freq: Every day | ORAL | 3 refills | Status: DC

## 2019-01-13 MED FILL — CARTIA XT 300 MG CAPSULE SA: 300 | 90 days supply | Qty: 90 | Fill #0

## 2019-01-13 NOTE — Telephone Encounter (Signed)
Spoke with the patient, he expressed understanding about Dr. Theodosia Blender recommedations. He had no further questions.

## 2019-01-13 NOTE — Telephone Encounter (Signed)
Follow up ° ° °Patient is returning call per the previous message. °

## 2019-01-13 NOTE — Telephone Encounter (Signed)
STAT if HR is under 50 or over 120 (normal HR is 60-100 beats per minute)  1) What is your heart rate? 94 and have been up as high 106- started on Cardizem 01-07-19-no significant change  2) Do you have a log of your heart rate readings (document readings)?Pt have been keeping a log- sent reading via My-Chart  3) Do you have any other symptoms? Blood Pressure been a little over the normal

## 2019-01-13 NOTE — Telephone Encounter (Signed)
Hi. Has not been a full week yet but here is my BP and heart rate with the switch to cardizem.   01/06/19 116/84 HR 80; 01/08/19 132/83 HR 81; 01/09/19 1200hr 124/90 HR 96; 1900hr 129/80 HR 92; 01/10/19 0900hr 126/89 HR 90; 1200hr 135/79 HR 88; 01/11/19 1000hr 137/94 HR 102; 1700hr 128/95 HR 90; 2100hr 138/87 HR 95.  As you can see having some high resting heart rates and some high BP. No symptoms except for slight headache . Pulse regular. Don't know if you want to adjust the dose or wait to see if this settles out.  I leave on vacation this Saturday. Work next two days. You can call or text my cell phone at [phone]  Thanks, Colin Ina

## 2019-01-13 NOTE — Telephone Encounter (Signed)
Increase Cardizem CD to 300mg  daily and continue to follow HR and BP\ Fransico Him

## 2019-01-20 ENCOUNTER — Other Ambulatory Visit: Payer: Self-pay | Admitting: Gastroenterology

## 2019-01-20 ENCOUNTER — Other Ambulatory Visit (HOSPITAL_COMMUNITY): Payer: Self-pay | Admitting: Gastroenterology

## 2019-01-20 DIAGNOSIS — K760 Fatty (change of) liver, not elsewhere classified: Secondary | ICD-10-CM | POA: Diagnosis not present

## 2019-01-20 DIAGNOSIS — R945 Abnormal results of liver function studies: Secondary | ICD-10-CM

## 2019-01-20 DIAGNOSIS — Z800 Family history of malignant neoplasm of digestive organs: Secondary | ICD-10-CM | POA: Diagnosis not present

## 2019-01-20 DIAGNOSIS — R7989 Other specified abnormal findings of blood chemistry: Secondary | ICD-10-CM

## 2019-01-20 DIAGNOSIS — R911 Solitary pulmonary nodule: Secondary | ICD-10-CM | POA: Diagnosis not present

## 2019-01-20 DIAGNOSIS — R740 Nonspecific elevation of levels of transaminase and lactic acid dehydrogenase [LDH]: Secondary | ICD-10-CM | POA: Diagnosis not present

## 2019-01-26 ENCOUNTER — Ambulatory Visit (HOSPITAL_COMMUNITY)
Admission: RE | Admit: 2019-01-26 | Discharge: 2019-01-26 | Disposition: A | Discharge status: Home / Self Care | Payer: 59 | Source: Ambulatory Visit | Attending: Gastroenterology | Admitting: Gastroenterology

## 2019-01-26 ENCOUNTER — Other Ambulatory Visit: Payer: Self-pay

## 2019-01-26 ENCOUNTER — Ambulatory Visit (INDEPENDENT_AMBULATORY_CARE_PROVIDER_SITE_OTHER)
Admission: RE | Admit: 2019-01-26 | Discharge: 2019-01-26 | Disposition: A | Discharge status: Home / Self Care | Payer: Self-pay | Source: Ambulatory Visit | Attending: Cardiology | Admitting: Cardiology

## 2019-01-26 DIAGNOSIS — K760 Fatty (change of) liver, not elsewhere classified: Secondary | ICD-10-CM | POA: Insufficient documentation

## 2019-01-26 DIAGNOSIS — E7800 Pure hypercholesterolemia, unspecified: Secondary | ICD-10-CM

## 2019-01-26 DIAGNOSIS — R945 Abnormal results of liver function studies: Secondary | ICD-10-CM | POA: Insufficient documentation

## 2019-01-26 DIAGNOSIS — R7989 Other specified abnormal findings of blood chemistry: Secondary | ICD-10-CM

## 2019-02-01 ENCOUNTER — Telehealth: Payer: Self-pay

## 2019-02-01 DIAGNOSIS — E7800 Pure hypercholesterolemia, unspecified: Secondary | ICD-10-CM

## 2019-02-01 NOTE — Telephone Encounter (Signed)
LMTCB

## 2019-02-01 NOTE — Telephone Encounter (Signed)
-----   Message from Sueanne Margarita, MD sent at 01/30/2019 11:51 PM EDT ----- Coronary calcium score high - given the degree of calcium and problems with elevated LFTs in the past and poorly controlled lipids - please refer to lipid clinic to see if he would qualify for PCSK9i.  Also I would like him to have a coronary CTA to assess for degree of CAD given high score

## 2019-02-07 MED FILL — PRAVASTATIN SODIUM 20 MG TA: 20 | 30 days supply | Qty: 30 | Fill #0

## 2019-02-08 MED ORDER — DILTIAZEM HCL ER COATED BEADS 360 MG PO CP24: 360.0000 mg | ORAL_CAPSULE | Freq: Every day | ORAL | 3 refills | Status: DC

## 2019-02-08 NOTE — Telephone Encounter (Signed)
Follow up   Patient is returning call for CT Scoring results.

## 2019-02-08 NOTE — Telephone Encounter (Signed)
Spoke with the patient, he expressed understanding about going to lipid clinic for PCSK9i. The patient also accepted a Cardiac CT, but will wait till later to discuss the instructions. He stated that his heart has been around 90s in the morning and remains elevated throughout the day. He stated he felt better controlled on metoprolol, but was stopped since his blood sugar issues. He wanted to know if we could increase his diltiazem to help with his high normal heart rate.

## 2019-02-08 NOTE — Telephone Encounter (Signed)
Left a detailed message about Dr. Theodosia Blender recommendations.

## 2019-02-08 NOTE — Telephone Encounter (Signed)
Please verify that he is on Cardizem CD 300mg  daily right now

## 2019-02-08 NOTE — Telephone Encounter (Signed)
Yes the patient is taking 300 mg.

## 2019-02-08 NOTE — Telephone Encounter (Signed)
Increase to Cardizem CD 360mg  daily and have patient check BP and HR daily for a week and call with results

## 2019-02-09 MED FILL — dilTIAZem HCL ER COATED BEA: 360 | 90 days supply | Qty: 90 | Fill #0

## 2019-02-10 ENCOUNTER — Ambulatory Visit: Payer: 59

## 2019-02-12 ENCOUNTER — Encounter (HOSPITAL_BASED_OUTPATIENT_CLINIC_OR_DEPARTMENT_OTHER): Payer: 59

## 2019-02-14 ENCOUNTER — Other Ambulatory Visit: Payer: 59

## 2019-02-16 MED FILL — MONTELUKAST SOD 10 MG TAB: 10 | 90 days supply | Qty: 90 | Fill #0

## 2019-02-24 ENCOUNTER — Encounter: Payer: Self-pay | Admitting: Pulmonary Disease

## 2019-03-07 ENCOUNTER — Other Ambulatory Visit: Payer: 59

## 2019-03-09 ENCOUNTER — Ambulatory Visit: Payer: 59 | Admitting: Pulmonary Disease

## 2019-03-10 ENCOUNTER — Ambulatory Visit: Payer: 59 | Admitting: Pulmonary Disease

## 2019-03-18 MED FILL — PRAVASTATIN SODIUM 20 MG TA: 20 | 30 days supply | Qty: 30 | Fill #1

## 2019-03-21 MED FILL — ESOMEPRAZOLE MAG DR 40 MG C: 40 | 90 days supply | Qty: 90 | Fill #0

## 2019-03-22 ENCOUNTER — Telehealth (INDEPENDENT_AMBULATORY_CARE_PROVIDER_SITE_OTHER): Payer: Self-pay | Admitting: Pharmacist

## 2019-03-22 DIAGNOSIS — E7800 Pure hypercholesterolemia, unspecified: Secondary | ICD-10-CM

## 2019-03-22 NOTE — Progress Notes (Signed)
Patient ID: STOKELY JEANCHARLES                 DOB: [birth year]                    MRN: [id number]    HPI: Terry Kim is a 58 y.o. male patient referred to lipid clinic by Dr. Radford Pax. PMH is significant for hypertension, obesity, persistent atrial fibrillation, dyslipidemia and diabetes. Patient was referred to pharmacy lipid clinic for follow up. Visit conducted via telehealth due to COVID-19 outbreak. He has failed both atorvastatin and simvastatin therapy in the past. Currently, patient is on pravastatin 20 mg daily which was started about 3 months ago. Patient's most recent LDL was 111 on 11/24/2018.  LDL goal for this patient is <70 with diabetes diagnosis along with recent coronary calcium score of 373 (93rd percentile for age and sex).   Spoke with patient over the phone on 03/23/2019 to follow up with cholesterol management. Patient states that he is currently taking pravastatin 20 mg once every morning and reports overall compliance with medication with one missed dose due to delay in obtaining refill. Patient confirms that he has been able to pick up his prescription now and has resumed taking the medication. Reports some mild muscle pains, but he does not attribute this to the medication and states that the pain is a lot more mild than when he was on atorvastatin. Patient also reports taking aspirin 81 mg daily and fish oil 100mg  BID.   Current Medications: pravastatin 20mg  daily, omega-3 fatty acids 1000mg  BID Intolerances: atorvastatin, simvastatin 20 mg daily Risk Factors: diabetes, elevated coronary calcium score of 393 LDL goal: <70  Diet: Overall, patient states he has been trying to change his eating habits and including a daily salad, more veggies, and less carbs in his diet. His diet also includes fish, chicken, lean beef, and eggs.  Exercise: Patient reports doing some occasional yard work and gardening. He also reports some weight lifting and using his eliptical about 2-3 times per  week. He does state his knee injuries prohibit him from exercising for more than about 20 minutes at a time usually, but he is also able to go on hikes about one a week.  Family History: HTN in mother, Eczema in father   Social History: Patient denies tobacco and illicit drug use. Does drink alcohol 4xweek  Labs: Lipid labs on 08/03/2017: Total Cholesterol 156, HDL 45, TG 119, LDL 87, Non-HDL 110  Past Medical History:  Diagnosis Date  . Hypertension   . Obesity   . Persistent atrial fibrillation    CHADS2VASC score is 1  . Pure hypercholesterolemia   . Supraventricular premature beats     Current Outpatient Medications on File Prior to Visit  Medication Sig Dispense Refill  . aspirin 325 MG tablet Take 325 mg by mouth daily.    . budesonide-formoterol (SYMBICORT) 160-4.5 MCG/ACT inhaler Inhale 2 puffs into the lungs 2 (two) times daily. 1 Inhaler 6  . cetirizine (ZYRTEC) 10 MG tablet Take 10 mg by mouth daily.    Marland Kitchen diltiazem (CARDIZEM CD) 360 MG 24 hr capsule Take 1 capsule (360 mg total) by mouth daily. 90 capsule 3  . esomeprazole (NEXIUM) 40 MG capsule Take 1 capsule by mouth daily.    . montelukast (SINGULAIR) 10 MG tablet Take one tablet once daily as directed 30 tablet 5  . Multiple Vitamin (MULTI VITAMIN DAILY PO) Take by mouth.    Marland Kitchen  Omega-3 Fatty Acids (FISH OIL) 1000 MG CAPS Take 1,000 mg by mouth daily.    . pravastatin (PRAVACHOL) 20 MG tablet Take 1 tablet (20 mg total) by mouth every evening. 30 tablet 11  . PROAIR HFA 108 (90 Base) MCG/ACT inhaler      No current facility-administered medications on file prior to visit.     Allergies  Allergen Reactions  . Penicillins Hives  . Skelaxin [Metaxalone] Itching  . Doxycycline Rash    Assessment/Plan:  1. Hyperlipidemia -    LDL goal <70 due to diabetes and elevated calcium score. Patient has been tolerating pravastatin 20mg  for the last 3 months. Will recheck lipid panel next week to determine efficacy.    Continue taking pravastatin 20mg  daily. Advised patient to take pravastatin at night for increased efficacy. Can consider an increase in dose if LDL continues to be above goal.  Last TG levels in range at 119. Discontinue OTC fish oil at this time due to lack of cardiovascular benefit and normal TG levels. Patient will finish current bottle before discontinuing.   Lipid/LFT labs scheduled for 04/04/2019 at [facility]. Educated patient to stop eating at 8pm the night before labs.  Informed patient that 5/26 appointment with pharmacy clinic has been canceled. Will follow up with patient after lab results are back.    Gwenlyn Found, [place] D PGY1 Pharmacy Resident  03/23/2019   11:10 AM

## 2019-03-23 ENCOUNTER — Telehealth: Payer: Self-pay | Admitting: Cardiology

## 2019-03-23 NOTE — Telephone Encounter (Signed)
Spoke with the patient, he is going to call his insurance to see the out of pocket cost and determine if he can proceed with the Cardiac CT.

## 2019-03-23 NOTE — Telephone Encounter (Signed)
Follow up: ° ° ° °Patient returning your call. Please call patient back. °

## 2019-04-01 ENCOUNTER — Other Ambulatory Visit: Payer: 59

## 2019-04-04 ENCOUNTER — Other Ambulatory Visit: Payer: Self-pay

## 2019-04-04 ENCOUNTER — Other Ambulatory Visit: Payer: 59 | Admitting: *Deleted

## 2019-04-04 DIAGNOSIS — E7800 Pure hypercholesterolemia, unspecified: Secondary | ICD-10-CM | POA: Diagnosis not present

## 2019-04-04 LAB — HEPATIC FUNCTION PANEL
ALT: 26 IU/L (ref 0–44)
AST: 20 IU/L (ref 0–40)
Albumin: 4.5 g/dL (ref 3.8–4.9)
Alkaline Phosphatase: 46 IU/L (ref 39–117)
Bilirubin Total: 0.7 mg/dL (ref 0.0–1.2)
Bilirubin, Direct: 0.22 mg/dL (ref 0.00–0.40)
Total Protein: 6.8 g/dL (ref 6.0–8.5)

## 2019-04-04 LAB — LIPID PANEL
Chol/HDL Ratio: 3.7 ratio (ref 0.0–5.0)
Cholesterol, Total: 181 mg/dL (ref 100–199)
HDL: 49 mg/dL (ref >39)
LDL Calculated: 114 mg/dL — ABNORMAL HIGH (ref 0–99)
Triglycerides: 92 mg/dL (ref 0–149)
VLDL Cholesterol Cal: 18 mg/dL (ref 5–40)

## 2019-04-07 ENCOUNTER — Telehealth: Payer: Self-pay | Admitting: Cardiology

## 2019-04-07 DIAGNOSIS — E7800 Pure hypercholesterolemia, unspecified: Secondary | ICD-10-CM

## 2019-04-07 MED ORDER — PRAVASTATIN SODIUM 40 MG PO TABS: 40.0000 mg | ORAL_TABLET | Freq: Every evening | ORAL | 3 refills | Status: DC

## 2019-04-07 NOTE — Telephone Encounter (Signed)
Patient notified of result.  Please refer to phone note from today for complete details.   Julaine Hua, [place] 04/07/2019 9:58 AM   Pt is agreeable to increase Pravastatin to 40 mg daily, new Rx has been sent to [facility]. Repeat labs 05/23/19. Pt aware fasting labs, nothing to eat after midnight, morning meds with water is ok. Pt thanked me for the call.

## 2019-04-07 NOTE — Telephone Encounter (Signed)
Pt called returning this office call was told in message to ask for [place].   Please give him a call back. [phone]

## 2019-04-07 NOTE — Telephone Encounter (Signed)
-----   Message from Sueanne Margarita, MD sent at 04/04/2019  7:40 PM EDT ----- Please let patient know that LDL goal is less than 70 and his LDL was 114.  I would like him to increase pravastatin to 40 mg daily and repeat an FLP and ALT in 6 weeks.

## 2019-04-08 MED FILL — PRAVASTATIN NA 40 MG TAB: 40 | 90 days supply | Qty: 90 | Fill #0

## 2019-04-12 ENCOUNTER — Ambulatory Visit: Payer: 59

## 2019-04-28 ENCOUNTER — Ambulatory Visit: Payer: 59 | Admitting: Pulmonary Disease

## 2019-05-02 MED FILL — dilTIAZem HCL ER COATED BEA: 360 | 90 days supply | Qty: 90 | Fill #1

## 2019-05-11 MED FILL — MONTELUKAST SOD 10 MG TAB: 10 | 90 days supply | Qty: 90 | Fill #1

## 2019-05-12 DIAGNOSIS — L237 Allergic contact dermatitis due to plants, except food: Secondary | ICD-10-CM | POA: Diagnosis not present

## 2019-05-12 DIAGNOSIS — Z6833 Body mass index (BMI) 33.0-33.9, adult: Secondary | ICD-10-CM | POA: Diagnosis not present

## 2019-05-23 ENCOUNTER — Other Ambulatory Visit: Payer: Self-pay

## 2019-05-23 ENCOUNTER — Other Ambulatory Visit: Payer: 59 | Admitting: *Deleted

## 2019-05-23 DIAGNOSIS — E7800 Pure hypercholesterolemia, unspecified: Secondary | ICD-10-CM | POA: Diagnosis not present

## 2019-05-23 LAB — LIPID PANEL
Chol/HDL Ratio: 2.6 ratio (ref 0.0–5.0)
Cholesterol, Total: 154 mg/dL (ref 100–199)
HDL: 60 mg/dL (ref >39)
LDL Calculated: 85 mg/dL (ref 0–99)
Triglycerides: 47 mg/dL (ref 0–149)
VLDL Cholesterol Cal: 9 mg/dL (ref 5–40)

## 2019-05-23 LAB — ALT: ALT: 23 IU/L (ref 0–44)

## 2019-05-25 ENCOUNTER — Telehealth: Payer: Self-pay | Admitting: Pharmacist

## 2019-05-25 ENCOUNTER — Other Ambulatory Visit: Payer: Self-pay

## 2019-05-25 ENCOUNTER — Telehealth (INDEPENDENT_AMBULATORY_CARE_PROVIDER_SITE_OTHER): Payer: Self-pay | Admitting: Pharmacist

## 2019-05-25 DIAGNOSIS — E7800 Pure hypercholesterolemia, unspecified: Secondary | ICD-10-CM

## 2019-05-25 NOTE — Progress Notes (Signed)
Patient ID: Terry Kim                 DOB: February 14, 1961                    MRN: [id number]     HPI: Terry Kim is a 58 y.o. male patient referred to lipid clinic by Dr Radford Pax. PMH is significant for hypertension, obesity, persistent atrial fibrillation, dyslipidemia and diabetes. Pt underwent calcium scoring on 01/26/19 which revealed elevated calcium score of 393, 93rd percentile matched for age and sex. At last visit in lipid clinic, patient was tolerating pravastatin 20mg  well. OTC fish oil was stopped due to normal TG and lack of CV outcomes data. Follow up lipid panel showed elevated LDL at 114. He was advised to increase pravastatin to 40mg  daily. Follow up phone call today due to COVID-19 to discuss results.  Patient reports a slight increase in fatigue and some muscle aches since increasing his pravastatin dose. He states his symptoms are vague and he does have arthritis, but it feels similar to what he experienced on atorvastatin previously. He is excited his cholesterol has improved since his last check.  Current Medications: pravastatin 40mg  daily Intolerances: atorvastatin, simvastatin 20 mg daily - myalgias Risk Factors: diabetes, elevated coronary calcium score of 393 LDL goal: 70mg /dL  Diet: Overall, patient states he has been trying to change his eating habits and including a daily salad, more veggies, and less carbs in his diet. His diet also includes fish, chicken, lean beef, and eggs.  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 Patient reports doing some occasional yard work and gardening. He also reports some weight lifting and using his eliptical about 2-3 times per week. He does state his knee injuries prohibit him from exercising for more than about 20 minutes at a time usually, but he is also able to go on hikes about one a week.  Family History: HTN in mother, Eczema in father   Social History: Patient denies tobacco and illicit drug use. Does drink alcohol 4x/week   Labs:  05/23/19: TC 154, TG 47, HDL 60, LDL 85 (pravastatin 40mg -HS) 04/04/19: TC 181, TG 92, HDL 49, LDL 114 (pravastatin 20mg  daily-AM) 08/03/17: TC 156, HDL 45, TG 119, LDL 87, Non-HDL 110 (simvastatin 20mg )  Past Medical History:  Diagnosis Date  . Hypertension   . Obesity   . Persistent atrial fibrillation    CHADS2VASC score is 1  . Pure hypercholesterolemia   . Supraventricular premature beats     Current Outpatient Medications on File Prior to Visit  Medication Sig Dispense Refill  . aspirin 81 MG tablet Take 81 mg by mouth daily.     . budesonide-formoterol (SYMBICORT) 160-4.5 MCG/ACT inhaler Inhale 2 puffs into the lungs 2 (two) times daily. 1 Inhaler 6  . cetirizine (ZYRTEC) 10 MG tablet Take 10 mg by mouth daily.    Marland Kitchen diltiazem (CARDIZEM CD) 360 MG 24 hr capsule Take 1 capsule (360 mg total) by mouth daily. 90 capsule 3  . esomeprazole (NEXIUM) 40 MG capsule Take 1 capsule by mouth daily.    . montelukast (SINGULAIR) 10 MG tablet Take one tablet once daily as directed 30 tablet 5  . Multiple Vitamin (MULTI VITAMIN DAILY PO) Take by mouth.    . pravastatin (PRAVACHOL) 40 MG tablet Take 1 tablet (40 mg total) by mouth every evening. 90 tablet 3  . PROAIR HFA 108 (90 Base) MCG/ACT inhaler  No current facility-administered medications on file prior to visit.     Allergies  Allergen Reactions  . Penicillins Hives  . Skelaxin [Metaxalone] Itching  . Doxycycline Rash    Assessment/Plan:  1. Hyperlipidemia - LDL improved to 85 however remains above goal < 70. He is experiencing an increase in fatigue and muscle aches on pravastatin 40mg  daily. He will take a 1 week holiday off of pravastatin to see if symptoms are related and will call clinic next Monday with an update. If his symptoms do not improve, will resume pravastatin 40mg  and add Zetia or increase pravastatin to 80mg . If his symptoms do improve, will trial rosuvastatin 10mg  daily with plans to titrate to max  tolerated dose. Can add Zetia or Repatha if needed.   E. Supple, PharmD, BCACP, [place] [address], [place], [place] [address] Phone: [phone]; Fax: [phone] 05/25/2019 9:11 AM

## 2019-05-25 NOTE — Telephone Encounter (Signed)

## 2019-05-30 ENCOUNTER — Telehealth: Payer: Self-pay | Admitting: Pharmacist

## 2019-05-30 MED ORDER — ROSUVASTATIN CALCIUM 10 MG PO TABS: 10.0000 mg | ORAL_TABLET | Freq: Every day | ORAL | 11 refills | Status: DC

## 2019-05-30 NOTE — Addendum Note (Signed)
Addended by: SUPPLE, MEGAN E on: 05/30/2019 08:52 AM   Modules accepted: Orders

## 2019-05-30 NOTE — Telephone Encounter (Signed)
Pt called clinic and states he is feeling better off of his pravastatin. Will try pt on rosuvastatin 10mg  daily. Advised him if he notices any myalgias to decrease frequency to every other day. He will call clinic with concerns. I'll reach out to pt in 1 month if I have not heard from him by then. Will plan to titrate rosuvastatin to max tolerated dose and recheck lipids 2-3 months after that.

## 2019-05-31 ENCOUNTER — Telehealth: Payer: 59

## 2019-06-01 MED FILL — ROSUVASTATIN CALCIUM 10 MG: 10 | 30 days supply | Qty: 30 | Fill #0

## 2019-06-13 MED FILL — ESOMEPRAZOLE MAG DR 40 MG C: 40 | 90 days supply | Qty: 90 | Fill #1

## 2019-06-30 ENCOUNTER — Telehealth: Payer: Self-pay | Admitting: Pharmacist

## 2019-06-30 NOTE — Telephone Encounter (Signed)
Called pt to follow up on rosuvastatin 10mg  daily tolerability. He experienced muscle aches on daily dosing. He decreased to every other day and still had some myalgias. He stopped over the weekend and symptoms improved. He wishes to pursue Repatha instead. Will submit prior authorization since pt is now intolerant to pravastatin 20mg  and 40mg  daily, rosuvastatin 10mg  every other day, atorvastatin, and simvastatin 20mg  daily. He will qualify for $5 Repatha copay card.

## 2019-07-01 MED FILL — PRAVASTATIN NA 40 MG TAB: 40 | 90 days supply | Qty: 90 | Fill #1

## 2019-07-06 ENCOUNTER — Telehealth: Payer: Self-pay | Admitting: Pharmacist

## 2019-07-06 DIAGNOSIS — E7800 Pure hypercholesterolemia, unspecified: Secondary | ICD-10-CM

## 2019-07-06 MED ORDER — REPATHA SURECLICK 140 MG/ML ~~LOC~~ SOAJ: 1.0000 "pen " | SUBCUTANEOUS | 11 refills | Status: DC

## 2019-07-06 MED FILL — REPATHA SURECLICK 140 MG/ML: 140 | 28 days supply | Qty: 2 | Fill #0

## 2019-07-06 NOTE — Telephone Encounter (Signed)
PA for repatha approved for 1 year Rx sent to [facility] outpatient pharmacy along with copay card. Will need to set up labs for 8-12 weeks Left voicemail for patient to call back.

## 2019-07-12 NOTE — Telephone Encounter (Addendum)
Pt returned call to clinic. Advised him that Repatha rx is at his pharmacy, he will pick it up today and plans to give first injection tomorrow. Scheduled f/u lab work in October. Pt will call with any concerns.

## 2019-07-12 NOTE — Addendum Note (Signed)
Addended by: SUPPLE, MEGAN E on: 07/12/2019 09:26 AM   Modules accepted: Orders

## 2019-08-08 MED FILL — dilTIAZem HCL ER COATED BEA: 360 | 90 days supply | Qty: 90 | Fill #2

## 2019-08-08 MED FILL — REPATHA SURECLICK 140 MG/ML: 140 | 28 days supply | Qty: 2 | Fill #1

## 2019-08-09 MED FILL — MONTELUKAST SOD 10 MG TAB: 10 | 90 days supply | Qty: 90 | Fill #2

## 2019-09-05 ENCOUNTER — Other Ambulatory Visit: Payer: Self-pay

## 2019-09-05 ENCOUNTER — Other Ambulatory Visit: Payer: 59 | Admitting: *Deleted

## 2019-09-05 DIAGNOSIS — E7800 Pure hypercholesterolemia, unspecified: Secondary | ICD-10-CM | POA: Diagnosis not present

## 2019-09-05 LAB — LIPID PANEL
Chol/HDL Ratio: 2.5 ratio (ref 0.0–5.0)
Cholesterol, Total: 138 mg/dL (ref 100–199)
HDL: 56 mg/dL (ref >39)
LDL Chol Calc (NIH): 73 mg/dL (ref 0–99)
Triglycerides: 33 mg/dL (ref 0–149)
VLDL Cholesterol Cal: 9 mg/dL (ref 5–40)

## 2019-09-05 LAB — HEPATIC FUNCTION PANEL
ALT: 17 IU/L (ref 0–44)
AST: 19 IU/L (ref 0–40)
Albumin: 4.6 g/dL (ref 3.8–4.9)
Alkaline Phosphatase: 45 IU/L (ref 39–117)
Bilirubin Total: 0.5 mg/dL (ref 0.0–1.2)
Bilirubin, Direct: 0.17 mg/dL (ref 0.00–0.40)
Total Protein: 7.2 g/dL (ref 6.0–8.5)

## 2019-09-06 MED FILL — REPATHA SURECLICK 140 MG/ML: 140 | 28 days supply | Qty: 2 | Fill #2

## 2019-09-11 MED FILL — ESOMEPRAZOLE MAG DR 40 MG C: 40 | 90 days supply | Qty: 90 | Fill #2

## 2019-09-29 MED FILL — PRAVASTATIN NA 40 MG TAB: 40 | 90 days supply | Qty: 90 | Fill #2

## 2019-10-10 MED FILL — SYMBICORT 160-4.5 MCG INH: 160-4.5 | 30 days supply | Qty: 10 | Fill #1

## 2019-10-10 MED FILL — REPATHA SURECLICK 140 MG/ML: 140 | 28 days supply | Qty: 2 | Fill #3

## 2019-11-01 MED FILL — dilTIAZem HCL ER COATED BEA: 360 | 90 days supply | Qty: 90 | Fill #3

## 2019-11-07 MED FILL — MONTELUKAST SOD 10 MG TAB: 10 | 90 days supply | Qty: 90 | Fill #3

## 2019-11-16 MED FILL — REPATHA SURECLICK 140 MG/ML: 140 | 28 days supply | Qty: 2 | Fill #4

## 2019-12-12 MED FILL — ESOMEPRAZOLE MAG DR 40 MG C: 40 | 90 days supply | Qty: 90 | Fill #0

## 2019-12-26 MED FILL — REPATHA SURECLICK 140 MG/ML: 140 | 28 days supply | Qty: 2 | Fill #5

## 2020-01-11 ENCOUNTER — Other Ambulatory Visit: Payer: Self-pay | Admitting: Pulmonary Disease

## 2020-01-11 DIAGNOSIS — G4733 Obstructive sleep apnea (adult) (pediatric): Secondary | ICD-10-CM

## 2020-01-16 ENCOUNTER — Other Ambulatory Visit: Payer: Self-pay | Admitting: Cardiology

## 2020-01-17 MED FILL — dilTIAZem HCL ER COATED BEA: 360 | 90 days supply | Qty: 90 | Fill #0

## 2020-02-07 MED FILL — MONTELUKAST SOD 10 MG TAB: 10 | 90 days supply | Qty: 90 | Fill #0

## 2020-02-15 MED FILL — REPATHA SURECLICK 140 MG/ML: 140 | 28 days supply | Qty: 2 | Fill #6

## 2020-03-14 MED FILL — REPATHA SURECLICK 140 MG/ML: 140 | 28 days supply | Qty: 2 | Fill #7

## 2020-04-02 MED FILL — METHOCARBAMOL 500 MG TABS: 500 | 30 days supply | Qty: 120 | Fill #0

## 2020-04-18 MED FILL — dilTIAZem HCL ER COATED BEA: 360 | 90 days supply | Qty: 90 | Fill #1

## 2020-05-01 MED FILL — REPATHA SURECLICK 140 MG/ML: 140 | 28 days supply | Qty: 2 | Fill #8

## 2020-06-11 ENCOUNTER — Ambulatory Visit: Payer: 59 | Admitting: Cardiology

## 2020-06-28 ENCOUNTER — Ambulatory Visit: Payer: 59 | Admitting: Cardiology

## 2020-07-02 MED FILL — REPATHA SURECLICK 140 MG/ML: 140 | 28 days supply | Qty: 2 | Fill #9

## 2020-07-18 ENCOUNTER — Telehealth: Payer: Self-pay | Admitting: *Deleted

## 2020-07-18 ENCOUNTER — Ambulatory Visit (INDEPENDENT_AMBULATORY_CARE_PROVIDER_SITE_OTHER): Payer: No Typology Code available for payment source | Admitting: Cardiology

## 2020-07-18 ENCOUNTER — Other Ambulatory Visit: Payer: Self-pay

## 2020-07-18 ENCOUNTER — Encounter: Payer: Self-pay | Admitting: Cardiology

## 2020-07-18 VITALS — BP 120/84 | HR 77 | Ht 66.0 in | Wt 214.8 lb

## 2020-07-18 DIAGNOSIS — G4733 Obstructive sleep apnea (adult) (pediatric): Secondary | ICD-10-CM

## 2020-07-18 DIAGNOSIS — I10 Essential (primary) hypertension: Secondary | ICD-10-CM

## 2020-07-18 DIAGNOSIS — R931 Abnormal findings on diagnostic imaging of heart and coronary circulation: Secondary | ICD-10-CM

## 2020-07-18 DIAGNOSIS — E7800 Pure hypercholesterolemia, unspecified: Secondary | ICD-10-CM | POA: Diagnosis not present

## 2020-07-18 DIAGNOSIS — I4819 Other persistent atrial fibrillation: Secondary | ICD-10-CM

## 2020-07-18 MED FILL — dilTIAZem HCL ER COATED BEA: 360 | 90 days supply | Qty: 90 | Fill #2

## 2020-07-18 NOTE — Patient Instructions (Signed)
Medication Instructions:  Your physician recommends that you continue on your current medications as directed. Please refer to the Current Medication list given to you today.  *If you need a refill on your cardiac medications before your next appointment, please call your pharmacy*   Lab Work: Fastin lipids and ALT If you have labs (blood work) drawn today and your tests are completely normal, you will receive your results only by: Marland Kitchen MyChart Message (if you have MyChart) OR . A paper copy in the mail If you have any lab test that is abnormal or we need to change your treatment, we will call you to review the results.   Testing/Procedures: Your physician has recommended that you have a sleep study. This test records several body functions during sleep, including: brain activity, eye movement, oxygen and carbon dioxide blood levels, heart rate and rhythm, breathing rate and rhythm, the flow of air through your mouth and nose, snoring, body muscle movements, and chest and belly movement.  Follow-Up: At [facility], you and your health needs are our priority.  As part of our continuing mission to provide you with exceptional heart care, we have created designated Provider Care Teams.  These Care Teams include your primary Cardiologist (physician) and Advanced Practice Providers (APPs -  Physician Assistants and Nurse Practitioners) who all work together to provide you with the care you need, when you need it.  Your next appointment:   1 year(s)  The format for your next appointment:   In Person  Provider:   You may see Fransico Him, MD or one of the following Advanced Practice Providers on your designated Care Team:    Melina Copa, PA-C  Ermalinda Barrios, PA-C

## 2020-07-18 NOTE — Progress Notes (Signed)
Cardiology Office Note:    Date:  07/18/2020   ID:  Terry Kim, DOB [birth year], MRN [id number]  PCP:  Melinda Crutch (Inactive)  Cardiologist:  Fransico Him, MD    Referring MD: Asa Saunas, MD   Chief Complaint  Patient presents with  . Hypertension  . Hyperlipidemia  . Atrial Fibrillation    History of Present Illness:    Terry Kim is a 59 y.o. male with a hx of Persistent atrial fibrillation (CHADS2VASC score 1), HTN, dyslipidemia, coronary artery calcium with Ca score score of 373 which is 93rd% of age and and sex matched controls and obesity.   He is here today for followup and is doing well.  He denies any chest pain or pressure, SOB, DOE, PND, orthopnea, LE edema, dizziness, palpitations or syncope. He is compliant with his meds and is tolerating meds with no SE.    Past Medical History:  Diagnosis Date  . Hypertension   . Obesity   . Persistent atrial fibrillation (HCC)    CHADS2VASC score is 1  . Pure hypercholesterolemia   . Supraventricular premature beats     Past Surgical History:  Procedure Laterality Date  . KNEE ARTHROSCOPY WITH MEDIAL MENISECTOMY Left 07/05/2014   Procedure: LEFT ARTHROSCOPY KNEE WITH MEDIAL MENISECTOMY/DEBRIDEMENT/SHAVING (CHONDROPLASTY)/ARTHROSCOPY KNEE ABRASION ARTHROPLASTY/MULTIPLE DRILLING;  Surgeon: Yvette Rack., MD;  Location: [place];  Service: Orthopedics;  Laterality: Left;  . SHOULDER ARTHROSCOPY  2000   right  . SHOULDER ARTHROSCOPY W/ ROTATOR CUFF REPAIR  2012   left  . TONSILLECTOMY      Current Medications: Current Meds  Medication Sig  . aspirin 81 MG tablet Take 81 mg by mouth daily.   . budesonide-formoterol (SYMBICORT) 160-4.5 MCG/ACT inhaler Inhale 2 puffs into the lungs 2 (two) times daily.  . cetirizine (ZYRTEC) 10 MG tablet Take 10 mg by mouth daily.  Marland Kitchen diltiazem (CARDIZEM CD) 360 MG 24 hr capsule TAKE 1 CAPSULE (360 MG TOTAL) BY MOUTH DAILY.  Marland Kitchen esomeprazole (NEXIUM) 40 MG capsule  Take 1 capsule by mouth daily.  . Evolocumab (REPATHA SURECLICK) 502 MG/ML SOAJ Inject 1 pen into the skin every 14 (fourteen) days.  . montelukast (SINGULAIR) 10 MG tablet Take one tablet once daily as directed  . Multiple Vitamin (MULTI VITAMIN DAILY PO) Take by mouth.  Marland Kitchen PROAIR HFA 108 (90 Base) MCG/ACT inhaler      Allergies:   Other, Penicillins, Skelaxin [metaxalone], and Doxycycline   Social History   Socioeconomic History  . Marital status: Divorced    Spouse name: Not on file  . Number of children: Not on file  . Years of education: Not on file  . Highest education level: Not on file  Occupational History  . Not on file  Tobacco Use  . Smoking status: Never Smoker  . Smokeless tobacco: Never Used  Vaping Use  . Vaping Use: Never used  Substance and Sexual Activity  . Alcohol use: Yes    Comment: most days 4xweek  . Drug use: No  . Sexual activity: Not on file  Other Topics Concern  . Not on file  Social History Narrative  . Not on file   Social Determinants of Health   Financial Resource Strain:   . Difficulty of Paying Living Expenses: Not on file  Food Insecurity:   . Worried About Charity fundraiser in the Last Year: Not on file  . Ran Out of Food in the Last Year:  Not on file  Transportation Needs:   . Lack of Transportation (Medical): Not on file  . Lack of Transportation (Non-Medical): Not on file  Physical Activity:   . Days of Exercise per Week: Not on file  . Minutes of Exercise per Session: Not on file  Stress:   . Feeling of Stress : Not on file  Social Connections:   . Frequency of Communication with Friends and Family: Not on file  . Frequency of Social Gatherings with Friends and Family: Not on file  . Attends Religious Services: Not on file  . Active Member of Clubs or Organizations: Not on file  . Attends Archivist Meetings: Not on file  . Marital Status: Not on file     Family History: The patient's family history  includes Eczema in his father; Hypertension in his mother.  ROS:   Please see the history of present illness.    ROS  All other systems reviewed and negative.   EKGs/Labs/Other Studies Reviewed:    The following studies were reviewed today: none  EKG:  EKG is  ordered today.  The ekg ordered today demonstrates NSR with no ST changes Recent Labs: 09/05/2019: ALT 17   Recent Lipid Panel    Component Value Date/Time   CHOL 138 09/05/2019 0934   TRIG 33 09/05/2019 0934   HDL 56 09/05/2019 0934   CHOLHDL 2.5 09/05/2019 0934   CHOLHDL 3 06/05/2015 0848   VLDL 23.2 06/05/2015 0848   LDLCALC 73 09/05/2019 0934    Physical Exam:    VS:  BP 120/84   Pulse 77   Ht 5\' 6"  (1.676 m)   Wt 214 lb 12.8 oz (97.4 kg)   SpO2 93%   BMI 34.67 kg/m     Wt Readings from Last 3 Encounters:  07/18/20 214 lb 12.8 oz (97.4 kg)  01/04/19 234 lb 12.8 oz (106.5 kg)  12/28/18 242 lb 3.2 oz (109.9 kg)     GEN: Well nourished, well developed in no acute distress HEENT: Normal NECK: No JVD; No carotid bruits LYMPHATICS: No lymphadenopathy CARDIAC:RRR, no murmurs, rubs, gallops RESPIRATORY:  Clear to auscultation without rales, wheezing or rhonchi  ABDOMEN: Soft, non-tender, non-distended MUSCULOSKELETAL:  No edema; No deformity  SKIN: Warm and dry NEUROLOGIC:  Alert and oriented x 3 PSYCHIATRIC:  Normal affect    ASSESSMENT:    1. Persistent atrial fibrillation ([place])   2. Essential hypertension   3. Pure hypercholesterolemia   4. Agatston coronary artery calcium score between 200 and 399   5. OSA (obstructive sleep apnea)    PLAN:    In order of problems listed above:  1.  Persistent atrial fibrillation  -he is maintaining NSR on exam today and has not had any palpitations -Continue Cardizem CD 360mg  daily -His CHADS2VASC score is 1 and therefore not on anticoagulation.    2.  HTN  -BP controlled on exam -continue Cardizem CD 360mg  daily  3.  Hyperlipidemia  - his LDL was  84 in May with TAGs 53 and HDL 51  -LDL goal is < 70 given coronary Ca score of 373 which is 93rd% of age and and sex matched controls -he is intolerant to statins and is now on Repatha -He also has been battling with elevated LFTs and has been dx with fatty liver a few years ago with GI.  -repeat FLP and ALT  4.  Coronary artery calcium -Calcium score was 373 which is 93rd% of age and and  sex matched controls -needs aggressive risk factor modification -he has no anginal symptoms and is an avid hiker -continue  Repatha>>statin intolerant -HbA1C was good at 5.9% in May 2021  5.  OSA -he had a home sleep study that showed moderate to severe OSA with an AHI of 26/hr and CPAP was recommended but then COVID 19 occurred.  There was some question of accuracy because the pulse ox fell off -I will get a split night sleep study to evaluate further  Medication Adjustments/Labs and Tests Ordered: Current medicines are reviewed at length with the patient today.  Concerns regarding medicines are outlined above.  Orders Placed This Encounter  Procedures  . EKG 12-Lead   No orders of the defined types were placed in this encounter.   Signed, Fransico Him, MD  07/18/2020 3:57 PM    [place]

## 2020-07-18 NOTE — Addendum Note (Signed)
Addended by: Antonieta Iba on: 07/18/2020 04:00 PM   Modules accepted: Orders

## 2020-07-18 NOTE — Telephone Encounter (Signed)
-----   Message from Antonieta Iba, RN sent at 07/18/2020  4:07 PM EDT ----- Split night sleep study has been ordered for OSA.  Thanks!!

## 2020-07-19 ENCOUNTER — Ambulatory Visit: Payer: 59 | Admitting: Pulmonary Disease

## 2020-07-19 ENCOUNTER — Telehealth: Payer: Self-pay

## 2020-07-19 NOTE — Telephone Encounter (Signed)
[place] Focus Plan No PA required for Split Night Ok to schedule  Sent to Gershon Cull, CMA to schedule

## 2020-07-19 NOTE — Telephone Encounter (Signed)
-----   Message from Antonieta Iba, RN sent at 07/18/2020  4:07 PM EDT ----- Split night sleep study has been ordered for OSA.  Thanks!!

## 2020-07-24 ENCOUNTER — Other Ambulatory Visit: Payer: Self-pay | Admitting: *Deleted

## 2020-07-24 MED ORDER — BUDESONIDE-FORMOTEROL FUMARATE 160-4.5 MCG/ACT IN AERO: 2.0000 | INHALATION_SPRAY | Freq: Two times a day (BID) | RESPIRATORY_TRACT | 0 refills | Status: DC

## 2020-07-24 MED FILL — SYMBICORT 160-4.5 MCG INH: 160-4.5 | 30 days supply | Qty: 10 | Fill #0

## 2020-07-24 NOTE — Telephone Encounter (Addendum)
Patient is scheduled for lab study on 08/30/20. Patient understands his sleep study will be done at [facility] sleep lab. Patient understands he will receive a sleep packet in a week or so. Patient understands to call if he does not receive the sleep packet in a timely manner.  Left detailed message on voicemail with date and time of titration and informed patient to call back to confirm or reschedule.

## 2020-07-24 NOTE — Telephone Encounter (Signed)
Split Night Bobby Rumpf, CMA  Freada Bergeron, [place]  No PA required for Split Night  Ok to schedule.

## 2020-07-30 ENCOUNTER — Other Ambulatory Visit: Payer: Self-pay

## 2020-07-30 ENCOUNTER — Other Ambulatory Visit: Payer: No Typology Code available for payment source | Admitting: *Deleted

## 2020-07-30 DIAGNOSIS — E7800 Pure hypercholesterolemia, unspecified: Secondary | ICD-10-CM

## 2020-07-30 LAB — LIPID PANEL
Chol/HDL Ratio: 2.3 ratio (ref 0.0–5.0)
Cholesterol, Total: 138 mg/dL (ref 100–199)
HDL: 61 mg/dL (ref >39)
LDL Chol Calc (NIH): 62 mg/dL (ref 0–99)
Triglycerides: 77 mg/dL (ref 0–149)
VLDL Cholesterol Cal: 15 mg/dL (ref 5–40)

## 2020-07-30 LAB — ALT: ALT: 20 IU/L (ref 0–44)

## 2020-08-16 MED FILL — MONTELUKAST SOD 10 MG TAB: 10 | 90 days supply | Qty: 90 | Fill #0

## 2020-08-24 ENCOUNTER — Encounter (HOSPITAL_BASED_OUTPATIENT_CLINIC_OR_DEPARTMENT_OTHER): Payer: No Typology Code available for payment source | Admitting: Cardiology

## 2020-08-30 ENCOUNTER — Other Ambulatory Visit: Payer: Self-pay

## 2020-08-30 ENCOUNTER — Ambulatory Visit (HOSPITAL_BASED_OUTPATIENT_CLINIC_OR_DEPARTMENT_OTHER): Payer: No Typology Code available for payment source | Attending: Cardiology | Admitting: Cardiology

## 2020-08-30 ENCOUNTER — Other Ambulatory Visit: Payer: Self-pay | Admitting: Cardiology

## 2020-08-30 DIAGNOSIS — R0902 Hypoxemia: Secondary | ICD-10-CM | POA: Diagnosis not present

## 2020-08-30 DIAGNOSIS — G4733 Obstructive sleep apnea (adult) (pediatric): Secondary | ICD-10-CM

## 2020-08-30 MED FILL — REPATHA SURECLICK 140 MG/ML: 140 | 28 days supply | Qty: 2 | Fill #0

## 2020-09-02 NOTE — Procedures (Signed)
   Patient Name: Terry Kim, Terry Kim Date:08/30/2020 Gender: Male D.O.B: [birth year] Age (years): 16 Referring Provider: Fransico Him MD, ABSM Height (inches): 66 Interpreting Physician: Fransico Him MD, ABSM Weight (lbs): 215 RPSGT: Jorge Ny BMI: 35 MRN: [id number] Neck Size: 17.25  CLINICAL INFORMATION Sleep Study Type: NPSG  Indication for sleep study: Hypertension, Obesity  Epworth Sleepiness Score:   SLEEP STUDY TECHNIQUE As per the AASM Manual for the Scoring of Sleep and Associated Events v2.3 (April 2016) with a hypopnea requiring 4% desaturations.  The channels recorded and monitored were frontal, central and occipital EEG, electrooculogram (EOG), submentalis EMG (chin), nasal and oral airflow, thoracic and abdominal wall motion, anterior tibialis EMG, snore microphone, electrocardiogram, and pulse oximetry.  MEDICATIONS Medications self-administered by patient taken the night of the study : N/A  SLEEP ARCHITECTURE The study was initiated at 10:10:00 PM and ended at 5:03:42 AM.  Sleep onset time was 104.7 minutes and the sleep efficiency was 53.8%. The total sleep time was 222.5 minutes.  Stage REM latency was 109.0 minutes.  The patient spent 10.1% of the night in stage N1 sleep, 76.6% in stage N2 sleep, 0.0% in stage N3 and 13.3% in REM.  Alpha intrusion was absent.  Supine sleep was 15.03%.  RESPIRATORY PARAMETERS The overall apnea/hypopnea index (AHI) was 18.9 per hour. There were 0 total apneas, including 0 obstructive, 0 central and 0 mixed apneas. There were 70 hypopneas and 15 RERAs.  The AHI during Stage REM sleep was 48.8 per hour.  AHI while supine was 82.5 per hour.  The mean oxygen saturation was 93.3%. The minimum SpO2 during sleep was 80.0%.  loud snoring was noted during this study.  CARDIAC DATA The 2 lead EKG demonstrated sinus rhythm. The mean heart rate was 60.3 beats per minute. Other EKG findings include: None.  LEG  MOVEMENT DATA The total PLMS were 0 with a resulting PLMS index of 0.0. Associated arousal with leg movement index was 3.0 .  IMPRESSIONS - Moderate obstructive sleep apnea occurred during this study (AHI = 18.9/h). - No significant central sleep apnea occurred during this study (CAI = 0.0/h). - Moderate oxygen desaturation was noted during this study (Min O2 = 80.0%). - The patient snored with loud snoring volume. - No cardiac abnormalities were noted during this study. - Clinically significant periodic limb movements did not occur during sleep. No significant associated arousals.  DIAGNOSIS - Obstructive Sleep Apnea (G47.33) - Nocturnal Hypoxemia (G47.36)  RECOMMENDATIONS - Therapeutic CPAP titration to determine optimal pressure required to alleviate sleep disordered breathing. - Positional therapy avoiding supine position during sleep. - Avoid alcohol, sedatives and other CNS depressants that may worsen sleep apnea and disrupt normal sleep architecture. - Sleep hygiene should be reviewed to assess factors that may improve sleep quality. - Weight management and regular exercise should be initiated or continued if appropriate.  [Electronically signed] 09/02/2020 07:37 PM  Fransico Him MD, ABSM Diplomate, American Board of Sleep Medicine

## 2020-09-05 ENCOUNTER — Telehealth: Payer: Self-pay

## 2020-09-05 DIAGNOSIS — G4733 Obstructive sleep apnea (adult) (pediatric): Secondary | ICD-10-CM

## 2020-09-05 NOTE — Telephone Encounter (Signed)
Per DPR left detailed voicemail on mobile phone Will send to precert for CPAP titration Orders in

## 2020-09-05 NOTE — Telephone Encounter (Signed)
-----   Message from Sueanne Margarita, MD sent at 09/02/2020  7:46 PM EDT ----- Please let patient know that they have sleep apnea and recommend CPAP titration. Please set up titration in the sleep lab.

## 2020-09-10 NOTE — Telephone Encounter (Signed)
Patient is scheduled for titration study on 10/18/20. Patient understands his sleep study will be done at [facility] sleep lab. Patient understands he will receive a sleep packet in a week or so. Patient understands to call if he does not receive the sleep packet in a timely manner. Patient agrees with treatment and thanked me for call.

## 2020-09-17 ENCOUNTER — Telehealth: Payer: Self-pay | Admitting: *Deleted

## 2020-09-17 NOTE — Telephone Encounter (Signed)
-----   Message from [facility], [place] sent at 09/05/2020  3:46 PM EDT ----- Regarding: CPAP Titration Pt needs CPAP titration Orders in  Thanks

## 2020-09-17 NOTE — Telephone Encounter (Addendum)
Comments  09/05/20 4:00pm NO PA REQUIRED     Patient is scheduled for CPAP Titration on 10/18/20. Patient understands his titration study will be done at [facility] sleep lab. Patient understands he will receive a letter in a week or so detailing appointment, date, time, and location. Patient understands to call if he does not receive the letter  in a timely manner.  Scheduled with Leah  Left detailed message on voicemail and informed patient to call back with questions.

## 2020-10-03 ENCOUNTER — Other Ambulatory Visit: Payer: Self-pay

## 2020-10-03 ENCOUNTER — Ambulatory Visit: Payer: No Typology Code available for payment source | Admitting: Pulmonary Disease

## 2020-10-03 ENCOUNTER — Encounter: Payer: Self-pay | Admitting: Pulmonary Disease

## 2020-10-03 VITALS — BP 126/78 | HR 84 | Temp 97.7°F | Wt 226.1 lb

## 2020-10-03 DIAGNOSIS — J4520 Mild intermittent asthma, uncomplicated: Secondary | ICD-10-CM

## 2020-10-03 DIAGNOSIS — Z9189 Other specified personal risk factors, not elsewhere classified: Secondary | ICD-10-CM

## 2020-10-03 DIAGNOSIS — Z6836 Body mass index (BMI) 36.0-36.9, adult: Secondary | ICD-10-CM

## 2020-10-03 DIAGNOSIS — K219 Gastro-esophageal reflux disease without esophagitis: Secondary | ICD-10-CM

## 2020-10-03 DIAGNOSIS — G4733 Obstructive sleep apnea (adult) (pediatric): Secondary | ICD-10-CM | POA: Diagnosis not present

## 2020-10-03 DIAGNOSIS — R768 Other specified abnormal immunological findings in serum: Secondary | ICD-10-CM

## 2020-10-03 DIAGNOSIS — T7849XA Other allergy, initial encounter: Secondary | ICD-10-CM

## 2020-10-03 DIAGNOSIS — I4891 Unspecified atrial fibrillation: Secondary | ICD-10-CM

## 2020-10-03 DIAGNOSIS — Z889 Allergy status to unspecified drugs, medicaments and biological substances status: Secondary | ICD-10-CM

## 2020-10-03 NOTE — Patient Instructions (Signed)
Thank you for visiting Dr. Valeta Harms at [facility]. Today we recommend the following:  Continue Symbicort as needed for asthma symptoms  Continue Singulair and antihistamine   Return in about 1 year (around 10/03/2021) for Dr. Valeta Harms .    Please do your part to reduce the spread of COVID-19.

## 2020-10-03 NOTE — Progress Notes (Signed)
Synopsis: Referred in December 2019 for productive cough.   Subjective:   PATIENT ID: Terry Kim GENDER: male DOB: Mar 12, 1961, MRN: [id number]  Chief Complaint  Patient presents with  . Follow-up    asthma is well controlled recently    PMH of htn, obesity. For the past year he has ongoing productive cough. Cough is usually worse with extreme changes of temperature. Hot to cold and cold to hot temp. Laughing makes him cough. Excessive talking will make him cough. Every other month he has has excessive symptoms and will see his PCP and get placed on antibiotics with steroids. All of the steroids (history of allergic reaction to doxycycline) that started with rash and was treated with steroids that happened for a period of a year and was being treated for epidymitis. He has a history of hay fever and allergies to dust mites. Given albuterol, benadryl and daily histamine. He has used hycodan cough syurp in the past too.  Over the last few years he has noticed worsening reflux.  Family history with a uncle who had immunoglobulin deficiency ultimately requiring lung transplantation.  OV 12/28/2017: Patient seen today in the office following initiation of antihistamines and antiallergy medications as well as inhaler regimen.  Patient states that he is currently back to his previous baseline.  He does feel like his symptoms are much better than where he was before.  He would like to try paring back some of the medications.  He did stop his Symbicort because he ran out of the sample and felt like he was doing better so he did not call to let us know or ask for a refill.  Otherwise has been doing well.  Patient denies ongoing cough, denies fevers chills night sweats weight loss or sputum production.  Denies hemoptysis.  OV 10/03/2020: Patient doing well today.  Here for follow-up for asthma.  Currently using Symbicort as needed.  He has not had any exacerbation in the past year.  At this point has been  stable but is concerned about cost of the Symbicort.  He is only had 2 use this a few times.  He does have more flareups of his seasonal allergies in the springtime.   Past Medical History:  Diagnosis Date  . Hypertension   . Obesity   . Persistent atrial fibrillation (HCC)    CHADS2VASC score is 1  . Pure hypercholesterolemia   . Supraventricular premature beats      Family History  Problem Relation Age of Onset  . Hypertension Mother   . Eczema Father      Past Surgical History:  Procedure Laterality Date  . KNEE ARTHROSCOPY WITH MEDIAL MENISECTOMY Left 07/05/2014   Procedure: LEFT ARTHROSCOPY KNEE WITH MEDIAL MENISECTOMY/DEBRIDEMENT/SHAVING (CHONDROPLASTY)/ARTHROSCOPY KNEE ABRASION ARTHROPLASTY/MULTIPLE DRILLING;  Surgeon: Yvette Rack., MD;  Location: [facility];  Service: Orthopedics;  Laterality: Left;  . SHOULDER ARTHROSCOPY  2000   right  . SHOULDER ARTHROSCOPY W/ ROTATOR CUFF REPAIR  2012   left  . TONSILLECTOMY      Social History   Socioeconomic History  . Marital status: Divorced    Spouse name: Not on file  . Number of children: Not on file  . Years of education: Not on file  . Highest education level: Not on file  Occupational History  . Not on file  Tobacco Use  . Smoking status: Never Smoker  . Smokeless tobacco: Never Used  Vaping Use  . Vaping Use: Never used  Substance and Sexual Activity  . Alcohol use: Yes    Comment: most days 4xweek  . Drug use: No  . Sexual activity: Not on file  Other Topics Concern  . Not on file  Social History Narrative  . Not on file   Social Determinants of Health   Financial Resource Strain:   . Difficulty of Paying Living Expenses: Not on file  Food Insecurity:   . Worried About Charity fundraiser in the Last Year: Not on file  . Ran Out of Food in the Last Year: Not on file  Transportation Needs:   . Lack of Transportation (Medical): Not on file  . Lack of Transportation (Non-Medical):  Not on file  Physical Activity:   . Days of Exercise per Week: Not on file  . Minutes of Exercise per Session: Not on file  Stress:   . Feeling of Stress : Not on file  Social Connections:   . Frequency of Communication with Friends and Family: Not on file  . Frequency of Social Gatherings with Friends and Family: Not on file  . Attends Religious Services: Not on file  . Active Member of Clubs or Organizations: Not on file  . Attends Archivist Meetings: Not on file  . Marital Status: Not on file  Intimate Partner Violence:   . Fear of Current or Ex-Partner: Not on file  . Emotionally Abused: Not on file  . Physically Abused: Not on file  . Sexually Abused: Not on file     Allergies  Allergen Reactions  . Other     Myalgias on 10mg  every other day  . Penicillins Hives  . Skelaxin [Metaxalone] Itching  . Doxycycline Rash     Outpatient Medications Prior to Visit  Medication Sig Dispense Refill  . aspirin 81 MG tablet Take 81 mg by mouth daily.     . budesonide-formoterol (SYMBICORT) 160-4.5 MCG/ACT inhaler Inhale 2 puffs into the lungs 2 (two) times daily. 10.2 g 0  . cetirizine (ZYRTEC) 10 MG tablet Take 10 mg by mouth daily.    Marland Kitchen diltiazem (CARDIZEM CD) 360 MG 24 hr capsule TAKE 1 CAPSULE (360 MG TOTAL) BY MOUTH DAILY. 90 capsule 2  . esomeprazole (NEXIUM) 40 MG capsule Take 1 capsule by mouth daily.    . montelukast (SINGULAIR) 10 MG tablet Take one tablet once daily as directed 30 tablet 5  . Multiple Vitamin (MULTI VITAMIN DAILY PO) Take by mouth.    Marland Kitchen PROAIR HFA 108 (90 Base) MCG/ACT inhaler     . REPATHA SURECLICK 175 MG/ML SOAJ INJECT 1 PEN INTO THE SKIN EVERY 14 DAYS. 2 mL 11   No facility-administered medications prior to visit.    Review of Systems  Constitutional: Negative for chills, fever, malaise/fatigue and weight loss.  HENT: Negative for hearing loss, sore throat and tinnitus.   Eyes: Negative for blurred vision and double vision.    Respiratory: Positive for cough and shortness of breath. Negative for hemoptysis, sputum production, wheezing and stridor.   Cardiovascular: Negative for chest pain, palpitations, orthopnea, leg swelling and PND.  Gastrointestinal: Negative for abdominal pain, constipation, diarrhea, heartburn, nausea and vomiting.  Genitourinary: Negative for dysuria, hematuria and urgency.  Musculoskeletal: Negative for joint pain and myalgias.  Skin: Negative for itching and rash.  Neurological: Negative for dizziness, tingling, weakness and headaches.  Endo/Heme/Allergies: Negative for environmental allergies. Does not bruise/bleed easily.  Psychiatric/Behavioral: Negative for depression. The patient is not nervous/anxious and does not have  insomnia.   All other systems reviewed and are negative.    Objective:  Physical Exam Vitals reviewed.  Constitutional:      General: He is not in acute distress.    Appearance: He is well-developed. He is obese.  HENT:     Head: Normocephalic and atraumatic.  Eyes:     General: No scleral icterus.    Conjunctiva/sclera: Conjunctivae normal.     Pupils: Pupils are equal, round, and reactive to light.  Neck:     Vascular: No JVD.     Trachea: No tracheal deviation.  Cardiovascular:     Rate and Rhythm: Normal rate and regular rhythm.     Heart sounds: Normal heart sounds. No murmur heard.   Pulmonary:     Effort: Pulmonary effort is normal. No tachypnea, accessory muscle usage or respiratory distress.     Breath sounds: No stridor. No wheezing, rhonchi or rales.  Abdominal:     General: Bowel sounds are normal. There is no distension.     Palpations: Abdomen is soft.     Tenderness: There is no abdominal tenderness.  Musculoskeletal:        General: No tenderness.     Cervical back: Neck supple.  Lymphadenopathy:     Cervical: No cervical adenopathy.  Skin:    General: Skin is warm and dry.     Capillary Refill: Capillary refill takes less than 2  seconds.     Findings: No rash.  Neurological:     Mental Status: He is alert and oriented to person, place, and time.  Psychiatric:        Behavior: Behavior normal.      Vitals:   10/03/20 1517  BP: 126/78  Pulse: 84  Temp: 97.7 F (36.5 C)  TempSrc: Tympanic  SpO2: 93%  Weight: 226 lb 2 oz (102.6 kg)   93% on RA BMI Readings from Last 3 Encounters:  10/03/20 36.50 kg/m  08/30/20 34.70 kg/m  07/18/20 34.67 kg/m   Wt Readings from Last 3 Encounters:  10/03/20 226 lb 2 oz (102.6 kg)  08/30/20 215 lb (97.5 kg)  07/18/20 214 lb 12.8 oz (97.4 kg)    CBC    Component Value Date/Time   WBC 5.3 11/01/2018 1523   RBC 4.95 11/01/2018 1523   HGB 15.4 11/01/2018 1523   HCT 45.8 11/01/2018 1523   PLT 223.0 11/01/2018 1523   MCV 92.7 11/01/2018 1523   MCH 31.3 01/08/2011 0947   MCHC 33.7 11/01/2018 1523   RDW 14.6 11/01/2018 1523   LYMPHSABS 1.6 11/01/2018 1523   MONOABS 0.5 11/01/2018 1523   EOSABS 0.1 11/01/2018 1523   BASOSABS 0.1 11/01/2018 1523    Chest Imaging: No recent chest imaging   Pulmonary Functions Testing Results: PFT Results Latest Ref Rng & Units 01/03/2019  FVC-Pre L 3.65  FVC-Predicted Pre % 89  FVC-Post L 3.78  FVC-Predicted Post % 92  Pre FEV1/FVC % % 84  Post FEV1/FCV % % 87  FEV1-Pre L 3.05  FEV1-Predicted Pre % 97  FEV1-Post L 3.29  DLCO uncorrected ml/min/mmHg 30.53  DLCO UNC% % 126  DLVA Predicted % 131  TLC L 5.66  TLC % Predicted % 94  RV % Predicted % 93    FeNO: None   Pathology: None   Echocardiogram: None   Heart Catheterization: None     Assessment & Plan:   Mild intermittent asthma without complication  OSA (obstructive sleep apnea)  Gastroesophageal reflux disease without esophagitis  H/O multiple allergies  Positive RAST testing  Elevated IgE level  Atrial fibrillation, unspecified type (HCC)  BMI 36.0-36.9,adult  Discussion:  59 year old gentleman, atopic disease, prior allergies, prior hay  fever, positive Rast panel, mildly elevated IgE at 100.  Currently managed with as needed Symbicort.  There has been a few studies that have shown for mild asthma disease as needed Symbicort was not inferior to daily maintenance use. We discussed this again today in the office.  Patient is agreeable to continue with this plan.  Plan: Symbicort continued as needed for mild intermittent asthma symptoms. If he continues to worsen can consider to daily maintenance therapy. If he needs to do this he would like to switch to a cheaper medication. Continue CPAP per Dr. Radford Pax. Encourage weight loss exercise we discussed this today in the office. Continue Singulair and daily antihistamine for seasonal allergies.    Current Outpatient Medications:  .  aspirin 81 MG tablet, Take 81 mg by mouth daily. , Disp: , Rfl:  .  budesonide-formoterol (SYMBICORT) 160-4.5 MCG/ACT inhaler, Inhale 2 puffs into the lungs 2 (two) times daily., Disp: 10.2 g, Rfl: 0 .  cetirizine (ZYRTEC) 10 MG tablet, Take 10 mg by mouth daily., Disp: , Rfl:  .  diltiazem (CARDIZEM CD) 360 MG 24 hr capsule, TAKE 1 CAPSULE (360 MG TOTAL) BY MOUTH DAILY., Disp: 90 capsule, Rfl: 2 .  esomeprazole (NEXIUM) 40 MG capsule, Take 1 capsule by mouth daily., Disp: , Rfl:  .  montelukast (SINGULAIR) 10 MG tablet, Take one tablet once daily as directed, Disp: 30 tablet, Rfl: 5 .  Multiple Vitamin (MULTI VITAMIN DAILY PO), Take by mouth., Disp: , Rfl:  .  PROAIR HFA 108 (90 Base) MCG/ACT inhaler, , Disp: , Rfl:  .  REPATHA SURECLICK 536 MG/ML SOAJ, INJECT 1 PEN INTO THE SKIN EVERY 14 DAYS., Disp: 2 mL, Rfl: 11   Garner Nash, DO [facility] 10/03/2020 3:23 PM

## 2020-10-09 ENCOUNTER — Encounter (HOSPITAL_BASED_OUTPATIENT_CLINIC_OR_DEPARTMENT_OTHER): Payer: No Typology Code available for payment source | Admitting: Cardiology

## 2020-10-18 ENCOUNTER — Ambulatory Visit (HOSPITAL_BASED_OUTPATIENT_CLINIC_OR_DEPARTMENT_OTHER): Payer: No Typology Code available for payment source | Attending: Cardiology | Admitting: Cardiology

## 2020-10-18 ENCOUNTER — Other Ambulatory Visit: Payer: Self-pay

## 2020-10-18 DIAGNOSIS — G4733 Obstructive sleep apnea (adult) (pediatric): Secondary | ICD-10-CM | POA: Diagnosis not present

## 2020-10-18 DIAGNOSIS — R0902 Hypoxemia: Secondary | ICD-10-CM | POA: Insufficient documentation

## 2020-10-19 NOTE — Procedures (Signed)
   Patient Name: Terry Kim, Terry Kim Date: 10/18/2020 Gender: Male D.O.B: [birth year] Age (years): 20 Referring Provider: Fransico Him MD, ABSM Height (inches): 66 Interpreting Physician: Fransico Him MD, ABSM Weight (lbs): 220 RPSGT: Zadie Rhine BMI: 36 MRN: [id number] Neck Size: 17.50  CLINICAL INFORMATION The patient is referred for a CPAP titration to treat sleep apnea.  SLEEP STUDY TECHNIQUE As per the AASM Manual for the Scoring of Sleep and Associated Events v2.3 (April 2016) with a hypopnea requiring 4% desaturations.  The channels recorded and monitored were frontal, central and occipital EEG, electrooculogram (EOG), submentalis EMG (chin), nasal and oral airflow, thoracic and abdominal wall motion, anterior tibialis EMG, snore microphone, electrocardiogram, and pulse oximetry. Continuous positive airway pressure (CPAP) was initiated at the beginning of the study and titrated to treat sleep-disordered breathing.  MEDICATIONS Medications self-administered by patient taken the night of the study : N/A  TECHNICIAN COMMENTS Comments added by technician: PT Chisholm. Patient had difficulty initiating sleep. Patient was restless all through the night. Patient had more than two awakenings to use the bathroom Comments added by scorer: N/A  RESPIRATORY PARAMETERS Optimal PAP Pressure (cm):17  AHI at Optimal Pressure (/hr):3.9 Overall Minimal O2 (%):76.0  Supine % at Optimal Pressure (%):90 Minimal O2 at Optimal Pressure (%): 91.0   SLEEP ARCHITECTURE The study was initiated at 10:13:32 PM and ended at 4:32:57 AM.  Sleep onset time was 101.5 minutes and the sleep efficiency was 61.1%. The total sleep time was 231.9 minutes.  The patient spent 5.8% of the night in stage N1 sleep, 76.9% in stage N2 sleep, 2.4% in stage N3 and 14.9% in REM.Stage REM latency was 139.5 minutes  Wake after sleep onset was 46.0. Alpha intrusion was absent. Supine sleep was  100.00%.  CARDIAC DATA The 2 lead EKG demonstrated sinus rhythm. The mean heart rate was 66.4 beats per minute. Other EKG findings include: None.  LEG MOVEMENT DATA The total Periodic Limb Movements of Sleep (PLMS) were 0. The PLMS index was 0.0. A PLMS index of <15 is considered normal in adults.  IMPRESSIONS - The optimal PAP pressure was 17cm of water. - Central sleep apnea was not noted during this titration (CAI = 0.5/h). - Severe oxygen desaturations were observed during this titration (min O2 = 76.0%). - No snoring was audible during this study. - No cardiac abnormalities were observed during this study. - Clinically significant periodic limb movements were not noted during this study. Arousals associated with PLMs were rare.  DIAGNOSIS - Obstructive Sleep Apnea (G47.33) - Nocturnal Hypoxemia  RECOMMENDATIONS - Trial of CPAP therapy on 17 cm H2O with a Large size Fisher&Paykel Full Face Mask Simplus mask and heated humidification. - Avoid alcohol, sedatives and other CNS depressants that may worsen sleep apnea and disrupt normal sleep architecture. - Sleep hygiene should be reviewed to assess factors that may improve sleep quality. - Weight management and regular exercise should be initiated or continued. - Return to [facility] for re-evaluation after 8 weeks of therapy  [Electronically signed] 10/19/2020 08:19 PM  Fransico Him MD, ABSM Diplomate, American Board of Sleep Medicine

## 2020-10-20 ENCOUNTER — Telehealth: Payer: Self-pay | Admitting: *Deleted

## 2020-10-20 NOTE — Telephone Encounter (Addendum)
Informed patient of sleep study results and patient understanding was verbalized. Patient understands her sleep study showed they had a successful PAP titration and let DME know that orders are in EPIC. Please set up 8 week OV with me.  Left detailed message on voicemail and informed patient to call back with questions.  Upon patient request DME selection is [facility] Patient understands she/he will be contacted by [facility] to set up her/he cpap. Patient understands to call if [facility] does not contact her/he with new setup in a timely manner. Patient understands they will be called once confirmation has been received from [facility] that they have received their new machine to schedule 10 week follow up appointment.   [facility] notified of new cpap order  Please add to airview Patient was grateful for the call and thanked me.

## 2020-10-20 NOTE — Telephone Encounter (Signed)
-----   Message from Sueanne Margarita, MD sent at 10/19/2020  8:29 PM EST ----- Please let patient know that they had a successful PAP titration and let DME know that orders are in EPIC.  Please set up 8 week OV with me.

## 2020-10-31 ENCOUNTER — Other Ambulatory Visit: Payer: Self-pay | Admitting: Cardiology

## 2020-10-31 MED FILL — dilTIAZem HCL ER COATED BEA: 360 | 90 days supply | Qty: 90 | Fill #0

## 2020-10-31 MED FILL — MONTELUKAST SOD 10 MG TAB: 10 | 90 days supply | Qty: 90 | Fill #1

## 2020-10-31 MED FILL — REPATHA SURECLICK 140 MG/ML: 140 | 28 days supply | Qty: 2 | Fill #1

## 2021-01-18 MED FILL — REPATHA SURECLICK 140 MG/ML: 140 | 28 days supply | Qty: 2 | Fill #2

## 2021-01-25 MED FILL — DILTIAZEM ER 360MG CAPSULES: 360 | 90 days supply | Qty: 90 | Fill #1

## 2021-01-28 ENCOUNTER — Other Ambulatory Visit (HOSPITAL_COMMUNITY): Payer: Self-pay | Admitting: Family Medicine

## 2021-01-31 ENCOUNTER — Telehealth: Payer: Self-pay | Admitting: *Deleted

## 2021-01-31 ENCOUNTER — Other Ambulatory Visit: Payer: Self-pay | Admitting: *Deleted

## 2021-01-31 NOTE — Telephone Encounter (Signed)
I would switch to Advair HFA 115 2 puffs BID or if his symptoms are well controlled he can drop back to symbicort 80 and see how he does.  Garner Nash, DO [facility] 01/31/2021 2:56 PM

## 2021-01-31 NOTE — Telephone Encounter (Signed)
Pt was last seen 09/2020 and told to follow up in 1 year.  Received refill request for the symbicort 160 and when going to refill this medication, was given a warning that the insurance will no longer cover the symbicort 160 but will cover the following:  Advair 100. 250, 500 Advair HFA 45,115.230 Symb 80 Breo 100, 200  BI please advise on change in medication so that this will be covered by insurance.  Thanks

## 2021-01-31 NOTE — Telephone Encounter (Signed)
I have called and LM on VM for the pt to call back about his medication.

## 2021-02-01 ENCOUNTER — Other Ambulatory Visit: Payer: Self-pay | Admitting: Pulmonary Disease

## 2021-02-01 MED ORDER — ADVAIR HFA 115-21 MCG/ACT IN AERO: 2.0000 | INHALATION_SPRAY | Freq: Two times a day (BID) | RESPIRATORY_TRACT | 6 refills | Status: DC

## 2021-02-01 NOTE — Telephone Encounter (Signed)
The pt called back and he stated that he would try the advair 115.  This has been sent to the pharmacy and I advised him to call for any issues.  Pt voiced his understanding.

## 2021-02-06 ENCOUNTER — Telehealth: Payer: Self-pay | Admitting: Pulmonary Disease

## 2021-02-06 NOTE — Telephone Encounter (Signed)
Patient is returning phone call. Patient phone number is [phone].

## 2021-02-06 NOTE — Telephone Encounter (Signed)
Advair 100 disc  Thanks  Garner Nash, DO [facility] 02/06/2021 2:46 PM

## 2021-02-06 NOTE — Telephone Encounter (Signed)
LMTCB

## 2021-02-06 NOTE — Telephone Encounter (Signed)
ATC patient to get clarification about which inhaler is covered by his insurance. LMTCB When we went to order Advair Diskus 100 it gave the red x that it wasn't covered, when we ordered the Advair HFA it gave the yellow triangle

## 2021-02-06 NOTE — Telephone Encounter (Signed)
Called and spoke with patient, advised that he had been on Symbicort and it was no longer covered under his insurance.  He was switched to Advair [facility] 115-21 however, he was told this is not covered by his insurance.  He was told that Advair disc is covered.    Dr. Valeta Harms, Please advise if you want to switch patient to Advair disc, if so, what strength.  Thank you.

## 2021-02-06 NOTE — Telephone Encounter (Signed)
Please see message below.  This is the complete opposite as to what was just messaged.   Someone please call and confirm with insurance before sending another message.   [facility] 02/06/2021 3:45 PM

## 2021-02-06 NOTE — Telephone Encounter (Signed)
Called and spoke with patient about switching inhalers. Patient agreed to Advair Diskus 100. It looks like Advair Diskus isn't covered but Advair HFA is.  Dr. Valeta Harms please advise

## 2021-02-13 ENCOUNTER — Other Ambulatory Visit: Payer: Self-pay | Admitting: Pulmonary Disease

## 2021-02-13 MED ORDER — FLUTICASONE-SALMETEROL 100-50 MCG/DOSE IN AEPB: 1.0000 | INHALATION_SPRAY | Freq: Two times a day (BID) | RESPIRATORY_TRACT | 11 refills | Status: DC

## 2021-02-13 MED FILL — ADVAIR 100/50 DISKUS: 100-50 | 30 days supply | Qty: 60 | Fill #0

## 2021-02-13 NOTE — Telephone Encounter (Signed)
Spoke with the pt  He states that he was advised by pharmacist that the advair disk would be $5  I sent the rx in and advised to call back if any issues  Nothing further needed

## 2021-02-24 ENCOUNTER — Telehealth: Payer: Self-pay | Admitting: *Deleted

## 2021-02-24 NOTE — Telephone Encounter (Signed)
Received fax from the pharmacy about the [facility] 115/21.  This medication is currently costing the pt 75$ and he can get the advair Diskus for $5.    BI please advise. Thanks

## 2021-02-25 ENCOUNTER — Other Ambulatory Visit (HOSPITAL_COMMUNITY): Payer: Self-pay

## 2021-02-25 MED ORDER — FLUTICASONE-SALMETEROL 100-50 MCG/DOSE IN AEPB
1.0000 | INHALATION_SPRAY | Freq: Two times a day (BID) | RESPIRATORY_TRACT | 5 refills | Status: AC
  Filled 2021-02-25: qty 60, 30d supply, fill #0

## 2021-02-25 NOTE — Telephone Encounter (Signed)
Ok to switch to diskus [facility] 02/25/2021 9:17 AM

## 2021-04-10 ENCOUNTER — Other Ambulatory Visit (HOSPITAL_COMMUNITY): Payer: Self-pay

## 2021-04-10 MED ORDER — FLUTICASONE-SALMETEROL 100-50 MCG/ACT IN AEPB
1.0000 | INHALATION_SPRAY | Freq: Two times a day (BID) | RESPIRATORY_TRACT | 10 refills | Status: DC
  Filled 2021-04-10: qty 60, 30d supply, fill #0
  Filled 2022-02-06: qty 60, 30d supply, fill #1
  Filled 2022-02-07: qty 60, 30d supply, fill #0

## 2021-04-10 MED FILL — Evolocumab Subcutaneous Soln Auto-Injector 140 MG/ML: SUBCUTANEOUS | 28 days supply | Qty: 2 | Fill #0 | Status: AC

## 2021-04-29 ENCOUNTER — Other Ambulatory Visit (HOSPITAL_COMMUNITY): Payer: Self-pay

## 2021-04-29 MED FILL — Diltiazem HCl Coated Beads Cap ER 24HR 360 MG: ORAL | 90 days supply | Qty: 90 | Fill #0 | Status: AC

## 2021-04-30 ENCOUNTER — Other Ambulatory Visit (HOSPITAL_COMMUNITY): Payer: Self-pay

## 2021-04-30 MED ORDER — MONTELUKAST SODIUM 10 MG PO TABS
10.0000 mg | ORAL_TABLET | Freq: Every day | ORAL | 3 refills | Status: DC
  Filled 2021-04-30 – 2021-07-24 (×2): qty 90, 90d supply, fill #0
  Filled 2021-10-24: qty 90, 90d supply, fill #1
  Filled 2022-02-06: qty 90, 90d supply, fill #2

## 2021-05-02 ENCOUNTER — Other Ambulatory Visit (HOSPITAL_COMMUNITY): Payer: Self-pay

## 2021-05-02 MED ORDER — METHOCARBAMOL 500 MG PO TABS
500.0000 mg | ORAL_TABLET | Freq: Four times a day (QID) | ORAL | 1 refills | Status: DC | PRN
  Filled 2021-05-02 – 2021-11-15 (×2): qty 120, 30d supply, fill #0

## 2021-05-03 ENCOUNTER — Other Ambulatory Visit (HOSPITAL_COMMUNITY): Payer: Self-pay

## 2021-05-10 ENCOUNTER — Other Ambulatory Visit (HOSPITAL_COMMUNITY): Payer: Self-pay

## 2021-06-10 ENCOUNTER — Other Ambulatory Visit (HOSPITAL_COMMUNITY): Payer: Self-pay

## 2021-06-10 MED ORDER — CARESTART COVID-19 HOME TEST VI KIT
PACK | 0 refills | Status: DC
  Filled 2021-06-10: qty 4, 4d supply, fill #0

## 2021-07-24 ENCOUNTER — Other Ambulatory Visit (HOSPITAL_BASED_OUTPATIENT_CLINIC_OR_DEPARTMENT_OTHER): Payer: Self-pay

## 2021-07-24 MED FILL — Diltiazem HCl Coated Beads Cap ER 24HR 360 MG: ORAL | 90 days supply | Qty: 90 | Fill #0 | Status: AC

## 2021-07-24 MED FILL — Evolocumab Subcutaneous Soln Auto-Injector 140 MG/ML: SUBCUTANEOUS | 84 days supply | Qty: 6 | Fill #0 | Status: AC

## 2021-07-29 ENCOUNTER — Other Ambulatory Visit (HOSPITAL_BASED_OUTPATIENT_CLINIC_OR_DEPARTMENT_OTHER): Payer: Self-pay

## 2021-08-14 ENCOUNTER — Other Ambulatory Visit (HOSPITAL_BASED_OUTPATIENT_CLINIC_OR_DEPARTMENT_OTHER): Payer: Self-pay

## 2021-08-14 MED ORDER — INFLUENZA VAC SPLIT QUAD 0.5 ML IM SUSY
PREFILLED_SYRINGE | INTRAMUSCULAR | 0 refills | Status: DC
  Filled 2021-08-14: qty 0.5, 1d supply, fill #0

## 2021-08-16 ENCOUNTER — Encounter: Payer: Self-pay | Admitting: Cardiology

## 2021-08-16 ENCOUNTER — Other Ambulatory Visit: Payer: Self-pay

## 2021-08-16 ENCOUNTER — Ambulatory Visit: Payer: No Typology Code available for payment source | Admitting: Cardiology

## 2021-08-16 VITALS — BP 122/86 | HR 76 | Ht 66.0 in | Wt 238.4 lb

## 2021-08-16 DIAGNOSIS — I10 Essential (primary) hypertension: Secondary | ICD-10-CM | POA: Diagnosis not present

## 2021-08-16 DIAGNOSIS — G4733 Obstructive sleep apnea (adult) (pediatric): Secondary | ICD-10-CM

## 2021-08-16 DIAGNOSIS — R931 Abnormal findings on diagnostic imaging of heart and coronary circulation: Secondary | ICD-10-CM

## 2021-08-16 DIAGNOSIS — E7800 Pure hypercholesterolemia, unspecified: Secondary | ICD-10-CM

## 2021-08-16 DIAGNOSIS — I4819 Other persistent atrial fibrillation: Secondary | ICD-10-CM | POA: Diagnosis not present

## 2021-08-16 NOTE — Progress Notes (Signed)
Cardiology Office Note:    Date:  08/16/2021   ID:  Terry Kim, DOB 06-11-1961, MRN [id number]  PCP:  Melinda Crutch (Inactive)  Cardiologist:  Fransico Him, MD    Referring MD: Lawerance Cruel, MD   Chief Complaint  Patient presents with   Atrial Fibrillation   Coronary Artery Disease   Hypertension   Hyperlipidemia   Sleep Apnea   History of Present Illness:    Terry Kim is a 60 y.o. male with a hx of Persistent atrial fibrillation (CHADS2VASC score 1), HTN, dyslipidemia, coronary artery calcium with Ca score score of 373 which is 93rd% of age and and sex matched controls and obesity. He also had a sleep study done showing moderate OSA with an AHI of 18.9/hr and O2 sats as low as 80%. He underwent CPAP titration to 17cm H2O and is now here for followup.    He is here today for followup and is doing well.  He denies any chest pain or pressure, SOB, DOE (except with asthma flare ups), PND, orthopnea, LE edema, dizziness, palpitations or syncope. He is compliant with his meds and is tolerating meds with no SE.    He has been on CPAP device for 4 weeks and is frustrated.  He says that initially his device was not recording his usage correctly and he got a new device which is now recording correctly.  He says that his apneas are low on average < 1/hr.  He says that the pressure is too high and it is making his mask leaking and then he cannot sleep well. He tolerates the FF mask but would like to try a nasal pillow mask.  He has not really felt more rested because he has not been sleeping well as he has to wake up and adjust his mask frequently.  He denies any significant mouth or nasal dryness or nasal congestion.  He does not think that he snores.     Past Medical History:  Diagnosis Date   Hypertension    Obesity    Persistent atrial fibrillation (HCC)    CHADS2VASC score is 1   Pure hypercholesterolemia    Supraventricular premature beats     Past Surgical History:   Procedure Laterality Date   KNEE ARTHROSCOPY WITH MEDIAL MENISECTOMY Left 07/05/2014   Procedure: LEFT ARTHROSCOPY KNEE WITH MEDIAL MENISECTOMY/DEBRIDEMENT/SHAVING (CHONDROPLASTY)/ARTHROSCOPY KNEE ABRASION ARTHROPLASTY/MULTIPLE DRILLING;  Surgeon: Yvette Rack., MD;  Location: [facility];  Service: Orthopedics;  Laterality: Left;   SHOULDER ARTHROSCOPY  2000   right   SHOULDER ARTHROSCOPY W/ ROTATOR CUFF REPAIR  2012   left   TONSILLECTOMY      Current Medications: Current Meds  Medication Sig   aspirin 81 MG tablet Take 81 mg by mouth daily.    cetirizine (ZYRTEC) 10 MG tablet Take 10 mg by mouth daily.   COVID-19 At Home Antigen Test [facility] COVID-19 HOME TEST) KIT Use as directed.   diltiazem (CARDIZEM CD) 360 MG 24 hr capsule TAKE 1 CAPSULE (360 MG TOTAL) BY MOUTH DAILY.   esomeprazole (NEXIUM) 40 MG capsule Take 1 capsule by mouth daily.   Evolocumab 140 MG/ML SOAJ INJECT 1 PEN INTO THE SKIN EVERY 14 DAYS.   Fluticasone-Salmeterol (ADVAIR DISKUS) 100-50 MCG/DOSE AEPB Inhale 1 puff into the lungs in the morning and at bedtime.   influenza vac split quadrivalent PF (FLUARIX) 0.5 ML injection Inject into the muscle.   methocarbamol (ROBAXIN) 500 MG tablet Take 1 tablet (  500 mg total) by mouth every 6 (six) hours as needed.   montelukast (SINGULAIR) 10 MG tablet Take one tablet once daily as directed   Multiple Vitamin (MULTI VITAMIN DAILY PO) Take by mouth.   Omega-3 Fatty Acids (FISH OIL) 1000 MG CAPS Take 1,000 mg by mouth daily.   PROAIR HFA 108 (90 Base) MCG/ACT inhaler      Allergies:   Other, Penicillins, Skelaxin [metaxalone], and Doxycycline   Social History   Socioeconomic History   Marital status: Divorced    Spouse name: Not on file   Number of children: Not on file   Years of education: Not on file   Highest education level: Not on file  Occupational History   Not on file  Tobacco Use   Smoking status: Never   Smokeless tobacco: Never   Vaping Use   Vaping Use: Never used  Substance and Sexual Activity   Alcohol use: Yes    Comment: most days 4xweek   Drug use: No   Sexual activity: Not on file  Other Topics Concern   Not on file  Social History Narrative   Not on file   Social Determinants of Health   Financial Resource Strain: Not on file  Food Insecurity: Not on file  Transportation Needs: Not on file  Physical Activity: Not on file  Stress: Not on file  Social Connections: Not on file     Family History: The patient's family history includes Eczema in his father; Hypertension in his mother.  ROS:   Please see the history of present illness.    ROS  All other systems reviewed and negative.   EKGs/Labs/Other Studies Reviewed:    The following studies were reviewed today: none  EKG:  EKG is  ordered today.  The ekg ordered today demonstrates NSR Recent Labs: No results found for requested labs within last 8760 hours.   Recent Lipid Panel    Component Value Date/Time   CHOL 138 07/30/2020 0928   TRIG 77 07/30/2020 0928   HDL 61 07/30/2020 0928   CHOLHDL 2.3 07/30/2020 0928   CHOLHDL 3 06/05/2015 0848   VLDL 23.2 06/05/2015 0848   LDLCALC 62 07/30/2020 0928    Physical Exam:    VS:  BP 122/86   Pulse 76   Ht 5' 6" (1.676 m)   Wt 238 lb 6.4 oz (108.1 kg)   SpO2 97%   BMI 38.48 kg/m     Wt Readings from Last 3 Encounters:  08/16/21 238 lb 6.4 oz (108.1 kg)  10/18/20 220 lb (99.8 kg)  10/03/20 226 lb 2 oz (102.6 kg)     GEN: Well nourished, well developed in no acute distress HEENT: Normal NECK: No JVD; No carotid bruits LYMPHATICS: No lymphadenopathy CARDIAC:RRR, no murmurs, rubs, gallops RESPIRATORY:  Clear to auscultation without rales, wheezing or rhonchi  ABDOMEN: Soft, non-tender, non-distended MUSCULOSKELETAL:  No edema; No deformity  SKIN: Warm and dry NEUROLOGIC:  Alert and oriented x 3 PSYCHIATRIC:  Normal affect    ASSESSMENT:    1. Persistent atrial  fibrillation (HCC)   2. Primary hypertension   3. Pure hypercholesterolemia   4. Agatston coronary artery calcium score between 200 and 399   5. OSA (obstructive sleep apnea)    PLAN:    In order of problems listed above:  1.  Persistent atrial fibrillation  -he is maintaining NSR on exam today and denies any palpitations -Continue prescription drug management with cardizem CD 360mg daily>refilled -His CHADS2VASC   score is 1 and therefore not on anticoagulation.    2.  HTN  -BP is adequately controlled on exam today -continue Cardizem CD 360mg daily  3.  Hyperlipidemia  -LDL goal is < 70 given coronary Ca score of 373 which is 93rd% of age and and sex matched controls -he is intolerant to statins and is now on Repatha -He also has been battling with elevated LFTs and has been dx with fatty liver a few years ago with GI.  -I have personally reviewed and interpreted outside labs performed by patient's PCP which showed LDL 61, HDL 50, TAGs 107 and ALT 19 in May 2022  -continue prescription drug management with Evolocumab  4.  Coronary artery calcium -Calcium score was 373 which is 93rd% of age and and sex matched controls -needs aggressive risk factor modification -he denies any angina sx -continue  Repatha>>statin intolerant  5.  OSA - The patient is tolerating PAP therapy well without any problems.   The -I will get a download from his DME -he would like to try a nasal pillow mask which I will order along with a chin strap -he thinks the pressure is too high and has problems with his mask leaking -I will change to auto CPAP from 4 to 15cm H2O since his AHI has been so low and hopefully he will tolerate this better -I will get a download in 4 weeks   Medication Adjustments/Labs and Tests Ordered: Current medicines are reviewed at length with the patient today.  Concerns regarding medicines are outlined above.  Orders Placed This Encounter  Procedures   EKG 12-Lead    No  orders of the defined types were placed in this encounter.   Signed, Traci Turner, MD  08/16/2021 11:18 AM    [facility] 

## 2021-08-16 NOTE — Patient Instructions (Signed)
Medication Instructions:  Your physician recommends that you continue on your current medications as directed. Please refer to the Current Medication list given to you today.  *If you need a refill on your cardiac medications before your next appointment, please call your pharmacy*   Follow-Up: At [facility], you and your health needs are our priority.  As part of our continuing mission to provide you with exceptional heart care, we have created designated Provider Care Teams.  These Care Teams include your primary Cardiologist (physician) and Advanced Practice Providers (APPs -  Physician Assistants and Nurse Practitioners) who all work together to provide you with the care you need, when you need it.   Your next appointment:   1 year(s)  The format for your next appointment:   In Person  Provider:   Fransico Him, MD   Other Instructions Dr. Radford Pax is changing your CPAP settings to 4-15cm H20 and ordering you a nasal cushion mask with a chin strap

## 2021-08-20 ENCOUNTER — Telehealth: Payer: Self-pay | Admitting: *Deleted

## 2021-08-20 DIAGNOSIS — G4733 Obstructive sleep apnea (adult) (pediatric): Secondary | ICD-10-CM

## 2021-08-20 NOTE — Telephone Encounter (Signed)
-----   Message from [place], [place] sent at 08/16/2021 11:46 AM EDT ----- Please change to auto CPAP 4-15 cm H2O Get a download in 4 weeks Order nasal cushion mask with chin strap.   Thanks!!

## 2021-08-20 NOTE — Telephone Encounter (Signed)
Order placed to [facility] via community message 

## 2021-09-17 ENCOUNTER — Other Ambulatory Visit (HOSPITAL_COMMUNITY): Payer: Self-pay

## 2021-10-24 ENCOUNTER — Other Ambulatory Visit (HOSPITAL_BASED_OUTPATIENT_CLINIC_OR_DEPARTMENT_OTHER): Payer: Self-pay

## 2021-10-24 ENCOUNTER — Other Ambulatory Visit: Payer: Self-pay | Admitting: Cardiology

## 2021-10-24 MED ORDER — DILTIAZEM HCL ER COATED BEADS 360 MG PO CP24
ORAL_CAPSULE | ORAL | 3 refills | Status: DC
  Filled 2021-10-24: qty 90, 90d supply, fill #0
  Filled 2022-02-06: qty 90, 90d supply, fill #1
  Filled 2022-05-06: qty 90, 90d supply, fill #2
  Filled 2022-08-04: qty 90, 90d supply, fill #3

## 2021-10-25 ENCOUNTER — Other Ambulatory Visit (HOSPITAL_BASED_OUTPATIENT_CLINIC_OR_DEPARTMENT_OTHER): Payer: Self-pay

## 2021-11-12 ENCOUNTER — Other Ambulatory Visit (HOSPITAL_BASED_OUTPATIENT_CLINIC_OR_DEPARTMENT_OTHER): Payer: Self-pay

## 2021-11-12 MED ORDER — CARESTART COVID-19 HOME TEST VI KIT
PACK | 0 refills | Status: DC
  Filled 2021-11-12: qty 4, 4d supply, fill #0

## 2021-11-14 ENCOUNTER — Other Ambulatory Visit (HOSPITAL_BASED_OUTPATIENT_CLINIC_OR_DEPARTMENT_OTHER): Payer: Self-pay

## 2021-11-14 MED ORDER — COVID-19 AT HOME ANTIGEN TEST VI KIT
PACK | 0 refills | Status: DC
Start: 2021-11-14 — End: 2022-02-12
  Filled 2021-11-14: qty 2, 4d supply, fill #0

## 2021-11-15 ENCOUNTER — Other Ambulatory Visit: Payer: Self-pay

## 2021-11-15 ENCOUNTER — Other Ambulatory Visit (HOSPITAL_BASED_OUTPATIENT_CLINIC_OR_DEPARTMENT_OTHER): Payer: Self-pay

## 2021-11-15 MED ORDER — LAGEVRIO 200 MG PO CAPS
ORAL_CAPSULE | ORAL | 0 refills | Status: DC
Start: 2021-11-15 — End: 2022-12-31
  Filled 2021-11-15: qty 40, 5d supply, fill #0

## 2021-11-15 MED ORDER — BENZONATATE 100 MG PO CAPS
ORAL_CAPSULE | ORAL | 0 refills | Status: DC
  Filled 2021-11-15: qty 30, 10d supply, fill #0

## 2021-12-16 ENCOUNTER — Other Ambulatory Visit (HOSPITAL_BASED_OUTPATIENT_CLINIC_OR_DEPARTMENT_OTHER): Payer: Self-pay

## 2021-12-16 MED ORDER — PREDNISONE 20 MG PO TABS
ORAL_TABLET | ORAL | 0 refills | Status: DC
  Filled 2021-12-16: qty 10, 5d supply, fill #0

## 2021-12-16 MED ORDER — ALBUTEROL SULFATE HFA 108 (90 BASE) MCG/ACT IN AERS
2.0000 | INHALATION_SPRAY | Freq: Four times a day (QID) | RESPIRATORY_TRACT | 4 refills | Status: AC
  Filled 2021-12-16: qty 25.5, 51d supply, fill #0

## 2022-01-06 ENCOUNTER — Other Ambulatory Visit (HOSPITAL_BASED_OUTPATIENT_CLINIC_OR_DEPARTMENT_OTHER): Payer: Self-pay

## 2022-01-13 ENCOUNTER — Other Ambulatory Visit (HOSPITAL_BASED_OUTPATIENT_CLINIC_OR_DEPARTMENT_OTHER): Payer: Self-pay

## 2022-02-06 ENCOUNTER — Other Ambulatory Visit: Payer: Self-pay | Admitting: Cardiology

## 2022-02-06 ENCOUNTER — Other Ambulatory Visit (HOSPITAL_BASED_OUTPATIENT_CLINIC_OR_DEPARTMENT_OTHER): Payer: Self-pay

## 2022-02-07 ENCOUNTER — Other Ambulatory Visit: Payer: Self-pay | Admitting: Cardiology

## 2022-02-07 ENCOUNTER — Other Ambulatory Visit (HOSPITAL_BASED_OUTPATIENT_CLINIC_OR_DEPARTMENT_OTHER): Payer: Self-pay

## 2022-02-07 ENCOUNTER — Other Ambulatory Visit (HOSPITAL_COMMUNITY): Payer: Self-pay

## 2022-02-10 ENCOUNTER — Other Ambulatory Visit (HOSPITAL_BASED_OUTPATIENT_CLINIC_OR_DEPARTMENT_OTHER): Payer: Self-pay

## 2022-02-11 ENCOUNTER — Other Ambulatory Visit (HOSPITAL_BASED_OUTPATIENT_CLINIC_OR_DEPARTMENT_OTHER): Payer: Self-pay

## 2022-02-11 ENCOUNTER — Encounter (HOSPITAL_BASED_OUTPATIENT_CLINIC_OR_DEPARTMENT_OTHER): Payer: Self-pay | Admitting: Pharmacist

## 2022-02-11 MED ORDER — REPATHA SURECLICK 140 MG/ML ~~LOC~~ SOAJ
SUBCUTANEOUS | 11 refills | Status: DC
  Filled 2022-02-11: qty 6, 84d supply, fill #0
  Filled 2022-06-03: qty 6, 84d supply, fill #1
  Filled 2022-11-04 – 2022-11-18 (×2): qty 6, 84d supply, fill #2

## 2022-02-12 ENCOUNTER — Other Ambulatory Visit (HOSPITAL_BASED_OUTPATIENT_CLINIC_OR_DEPARTMENT_OTHER): Payer: Self-pay

## 2022-02-12 MED ORDER — CARESTART COVID-19 HOME TEST VI KIT
PACK | 0 refills | Status: DC
  Filled 2022-02-12: qty 4, 8d supply, fill #0

## 2022-02-14 ENCOUNTER — Other Ambulatory Visit (HOSPITAL_BASED_OUTPATIENT_CLINIC_OR_DEPARTMENT_OTHER): Payer: Self-pay

## 2022-02-14 MED ORDER — PREDNISONE 10 MG PO TABS
ORAL_TABLET | ORAL | 0 refills | Status: DC
  Filled 2022-02-14: qty 21, 6d supply, fill #0

## 2022-02-14 MED ORDER — SULFAMETHOXAZOLE-TRIMETHOPRIM 800-160 MG PO TABS
ORAL_TABLET | ORAL | 0 refills | Status: DC
  Filled 2022-02-14: qty 14, 7d supply, fill #0

## 2022-02-14 MED ORDER — BENZONATATE 200 MG PO CAPS
ORAL_CAPSULE | ORAL | 0 refills | Status: DC
  Filled 2022-02-14: qty 30, 10d supply, fill #0

## 2022-02-25 ENCOUNTER — Other Ambulatory Visit (HOSPITAL_BASED_OUTPATIENT_CLINIC_OR_DEPARTMENT_OTHER): Payer: Self-pay

## 2022-02-25 MED ORDER — PEG 3350-KCL-NA BICARB-NACL 420 G PO SOLR
ORAL | 0 refills | Status: DC
  Filled 2022-02-25: qty 4000, 1d supply, fill #0

## 2022-03-05 ENCOUNTER — Other Ambulatory Visit: Payer: Self-pay | Admitting: Gastroenterology

## 2022-03-05 DIAGNOSIS — R911 Solitary pulmonary nodule: Secondary | ICD-10-CM

## 2022-03-10 ENCOUNTER — Other Ambulatory Visit (HOSPITAL_BASED_OUTPATIENT_CLINIC_OR_DEPARTMENT_OTHER): Payer: Self-pay

## 2022-03-18 ENCOUNTER — Other Ambulatory Visit (HOSPITAL_BASED_OUTPATIENT_CLINIC_OR_DEPARTMENT_OTHER): Payer: Self-pay

## 2022-03-26 ENCOUNTER — Other Ambulatory Visit (HOSPITAL_BASED_OUTPATIENT_CLINIC_OR_DEPARTMENT_OTHER): Payer: Self-pay

## 2022-03-26 MED ORDER — COVID-19 AT HOME ANTIGEN TEST VI KIT
PACK | 0 refills | Status: DC
  Filled 2022-03-26: qty 4, 8d supply, fill #0

## 2022-04-09 ENCOUNTER — Ambulatory Visit
Admission: RE | Admit: 2022-04-09 | Discharge: 2022-04-09 | Disposition: A | Discharge status: Home / Self Care | Payer: No Typology Code available for payment source | Source: Ambulatory Visit | Attending: Gastroenterology | Admitting: Gastroenterology

## 2022-04-09 DIAGNOSIS — R911 Solitary pulmonary nodule: Secondary | ICD-10-CM

## 2022-05-06 ENCOUNTER — Other Ambulatory Visit (HOSPITAL_BASED_OUTPATIENT_CLINIC_OR_DEPARTMENT_OTHER): Payer: Self-pay

## 2022-05-06 ENCOUNTER — Other Ambulatory Visit: Payer: Self-pay | Admitting: Pulmonary Disease

## 2022-05-06 MED ORDER — MONTELUKAST SODIUM 10 MG PO TABS
10.0000 mg | ORAL_TABLET | Freq: Every day | ORAL | 3 refills | Status: DC
  Filled 2022-05-06: qty 90, 90d supply, fill #0
  Filled 2022-11-04: qty 90, 90d supply, fill #1
  Filled 2023-04-26: qty 90, 90d supply, fill #2

## 2022-05-07 ENCOUNTER — Other Ambulatory Visit (HOSPITAL_BASED_OUTPATIENT_CLINIC_OR_DEPARTMENT_OTHER): Payer: Self-pay

## 2022-05-07 ENCOUNTER — Encounter (HOSPITAL_BASED_OUTPATIENT_CLINIC_OR_DEPARTMENT_OTHER): Payer: Self-pay

## 2022-05-09 ENCOUNTER — Other Ambulatory Visit (HOSPITAL_BASED_OUTPATIENT_CLINIC_OR_DEPARTMENT_OTHER): Payer: Self-pay

## 2022-05-09 MED ORDER — ESOMEPRAZOLE MAGNESIUM 40 MG PO CPDR
DELAYED_RELEASE_CAPSULE | ORAL | 0 refills | Status: DC
  Filled 2022-05-09: qty 90, 90d supply, fill #0

## 2022-06-03 ENCOUNTER — Other Ambulatory Visit (HOSPITAL_BASED_OUTPATIENT_CLINIC_OR_DEPARTMENT_OTHER): Payer: Self-pay

## 2022-06-09 ENCOUNTER — Other Ambulatory Visit (HOSPITAL_BASED_OUTPATIENT_CLINIC_OR_DEPARTMENT_OTHER): Payer: Self-pay

## 2022-06-09 MED ORDER — METHOCARBAMOL 500 MG PO TABS
ORAL_TABLET | ORAL | 1 refills | Status: DC
  Filled 2022-06-09: qty 180, 30d supply, fill #0

## 2022-06-09 MED ORDER — FLUTICASONE-SALMETEROL 100-50 MCG/ACT IN AEPB
INHALATION_SPRAY | RESPIRATORY_TRACT | 0 refills | Status: DC
  Filled 2022-06-09: qty 180, 90d supply, fill #0

## 2022-06-30 ENCOUNTER — Other Ambulatory Visit (HOSPITAL_BASED_OUTPATIENT_CLINIC_OR_DEPARTMENT_OTHER): Payer: Self-pay

## 2022-07-08 ENCOUNTER — Other Ambulatory Visit (HOSPITAL_BASED_OUTPATIENT_CLINIC_OR_DEPARTMENT_OTHER): Payer: Self-pay

## 2022-08-04 ENCOUNTER — Other Ambulatory Visit (HOSPITAL_BASED_OUTPATIENT_CLINIC_OR_DEPARTMENT_OTHER): Payer: Self-pay

## 2022-08-07 ENCOUNTER — Other Ambulatory Visit (HOSPITAL_BASED_OUTPATIENT_CLINIC_OR_DEPARTMENT_OTHER): Payer: Self-pay

## 2022-08-07 MED ORDER — FLUARIX QUADRIVALENT 0.5 ML IM SUSY
PREFILLED_SYRINGE | INTRAMUSCULAR | 0 refills | Status: DC
  Filled 2022-08-07: qty 0.5, 1d supply, fill #0

## 2022-10-27 ENCOUNTER — Other Ambulatory Visit: Payer: Self-pay | Admitting: Cardiology

## 2022-10-27 ENCOUNTER — Other Ambulatory Visit (HOSPITAL_BASED_OUTPATIENT_CLINIC_OR_DEPARTMENT_OTHER): Payer: Self-pay

## 2022-10-28 ENCOUNTER — Other Ambulatory Visit (HOSPITAL_BASED_OUTPATIENT_CLINIC_OR_DEPARTMENT_OTHER): Payer: Self-pay

## 2022-10-28 MED ORDER — FLUTICASONE-SALMETEROL 100-50 MCG/ACT IN AEPB
1.0000 | INHALATION_SPRAY | Freq: Two times a day (BID) | RESPIRATORY_TRACT | 0 refills | Status: DC
  Filled 2022-10-28: qty 180, 90d supply, fill #0

## 2022-10-28 MED ORDER — DILTIAZEM HCL ER COATED BEADS 360 MG PO CP24
360.0000 mg | ORAL_CAPSULE | Freq: Every day | ORAL | 0 refills | Status: DC
  Filled 2022-11-04: qty 90, 90d supply, fill #0

## 2022-10-28 MED ORDER — ESOMEPRAZOLE MAGNESIUM 40 MG PO CPDR
40.0000 mg | DELAYED_RELEASE_CAPSULE | Freq: Every day | ORAL | 2 refills | Status: AC | PRN
  Filled 2022-10-28: qty 90, 90d supply, fill #0
  Filled 2023-04-26: qty 90, 90d supply, fill #1

## 2022-10-29 ENCOUNTER — Other Ambulatory Visit (HOSPITAL_BASED_OUTPATIENT_CLINIC_OR_DEPARTMENT_OTHER): Payer: Self-pay

## 2022-11-04 ENCOUNTER — Other Ambulatory Visit: Payer: Self-pay

## 2022-11-06 ENCOUNTER — Other Ambulatory Visit: Payer: Self-pay

## 2022-11-18 ENCOUNTER — Other Ambulatory Visit (HOSPITAL_BASED_OUTPATIENT_CLINIC_OR_DEPARTMENT_OTHER): Payer: Self-pay

## 2022-11-19 ENCOUNTER — Other Ambulatory Visit (HOSPITAL_BASED_OUTPATIENT_CLINIC_OR_DEPARTMENT_OTHER): Payer: Self-pay

## 2022-11-19 ENCOUNTER — Telehealth: Payer: Self-pay | Admitting: Pharmacist

## 2022-11-19 NOTE — Telephone Encounter (Signed)
Received fax from pharmacy that Vina PA is needed. KeyMelody Kim, waiting for clinical questions to populate.

## 2022-11-20 ENCOUNTER — Other Ambulatory Visit (HOSPITAL_BASED_OUTPATIENT_CLINIC_OR_DEPARTMENT_OTHER): Payer: Self-pay

## 2022-11-20 NOTE — Telephone Encounter (Signed)
Lipids checked most recently at PCP on 04/17/22 - LDL well controlled at 66. Clinical questions submitted.

## 2022-11-22 ENCOUNTER — Other Ambulatory Visit (HOSPITAL_BASED_OUTPATIENT_CLINIC_OR_DEPARTMENT_OTHER): Payer: Self-pay

## 2022-11-24 ENCOUNTER — Other Ambulatory Visit (HOSPITAL_BASED_OUTPATIENT_CLINIC_OR_DEPARTMENT_OTHER): Payer: Self-pay

## 2022-11-24 MED ORDER — REPATHA SURECLICK 140 MG/ML ~~LOC~~ SOAJ
SUBCUTANEOUS | 11 refills | Status: DC
  Filled 2023-06-05: qty 6, 84d supply, fill #0
  Filled 2023-10-17 – 2023-10-22 (×3): qty 6, 84d supply, fill #1

## 2022-11-24 NOTE — Telephone Encounter (Signed)
Prior auth approved through 11/22/23, refill sent to pharmacy.

## 2022-12-13 DIAGNOSIS — G4733 Obstructive sleep apnea (adult) (pediatric): Secondary | ICD-10-CM | POA: Diagnosis not present

## 2022-12-31 ENCOUNTER — Ambulatory Visit: Payer: 59 | Attending: Cardiology | Admitting: Cardiology

## 2022-12-31 ENCOUNTER — Encounter: Payer: Self-pay | Admitting: Cardiology

## 2022-12-31 VITALS — BP 120/82 | HR 74 | Ht 66.0 in | Wt 247.8 lb

## 2022-12-31 DIAGNOSIS — G4733 Obstructive sleep apnea (adult) (pediatric): Secondary | ICD-10-CM | POA: Diagnosis not present

## 2022-12-31 DIAGNOSIS — I10 Essential (primary) hypertension: Secondary | ICD-10-CM

## 2022-12-31 DIAGNOSIS — I4819 Other persistent atrial fibrillation: Secondary | ICD-10-CM | POA: Diagnosis not present

## 2022-12-31 DIAGNOSIS — E6609 Other obesity due to excess calories: Secondary | ICD-10-CM

## 2022-12-31 DIAGNOSIS — R931 Abnormal findings on diagnostic imaging of heart and coronary circulation: Secondary | ICD-10-CM

## 2022-12-31 DIAGNOSIS — E7800 Pure hypercholesterolemia, unspecified: Secondary | ICD-10-CM | POA: Diagnosis not present

## 2022-12-31 NOTE — Addendum Note (Signed)
Addended by: Joni Reining on: 12/31/2022 09:10 AM   Modules accepted: Orders

## 2022-12-31 NOTE — Progress Notes (Signed)
Cardiology Office Note:    Date:  12/31/2022   ID:  Terry Kim, DOB Dec 12, 1960, MRN [id number]  PCP:  Melinda Crutch (Inactive)  Cardiologist:  Fransico Him, MD    Referring MD: No ref. provider found   Chief Complaint  Patient presents with   Atrial Fibrillation   Hypertension   Hyperlipidemia   Sleep Apnea   History of Present Illness:    Terry Kim is a 62 y.o. male with a hx of Persistent atrial fibrillation (CHADS2VASC score 1), HTN, dyslipidemia, coronary artery calcium with Ca score score of 373 which is 93rd% of age and and sex matched controls and obesity. He also had a sleep study done showing moderate OSA with an AHI of 18.9/hr and O2 sats as low as 80%. He underwent CPAP titration to 17cm H2O and is now here for followup.    He is here today for followup and is doing well.  He denies any chest pain or pressure, SOB, DOE, PND, orthopnea, LE edema, dizziness, palpitations or syncope. He is compliant with his meds and is tolerating meds with no SE.    At last OV he has having problems with his device that was new and felt the pressure was too high and his mask was leaking. He requested a nasal pillow mask which I ordered for him.  He was also changed to auto CPAP from 4 to 15cm H2O.    He is doing well with his PAP device and thinks that he has gotten used to it.  He tolerates the mask and feels the pressure is adequate.  Since going on PAP he feels rested in the am and has no significant daytime sleepiness.  He denies any significant mouth or nasal dryness or nasal congestion.  He does not think that he snores.    Past Medical History:  Diagnosis Date   Hypertension    Obesity    Persistent atrial fibrillation (HCC)    CHADS2VASC score is 1   Pure hypercholesterolemia    Supraventricular premature beats     Past Surgical History:  Procedure Laterality Date   KNEE ARTHROSCOPY WITH MEDIAL MENISECTOMY Left 07/05/2014   Procedure: LEFT ARTHROSCOPY KNEE WITH MEDIAL  MENISECTOMY/DEBRIDEMENT/SHAVING (CHONDROPLASTY)/ARTHROSCOPY KNEE ABRASION ARTHROPLASTY/MULTIPLE DRILLING;  Surgeon: Yvette Rack., MD;  Location: [facility];  Service: Orthopedics;  Laterality: Left;   SHOULDER ARTHROSCOPY  2000   right   SHOULDER ARTHROSCOPY W/ ROTATOR CUFF REPAIR  2012   left   TONSILLECTOMY      Current Medications: Current Meds  Medication Sig   albuterol (VENTOLIN HFA) 108 (90 Base) MCG/ACT inhaler Inhale 2 puffs into the lungs every 6 (six) hours.   aspirin 81 MG tablet Take 81 mg by mouth daily.    diltiazem (CARDIZEM CD) 360 MG 24 hr capsule Take 1 capsule (360 mg total) by mouth daily.   esomeprazole (NEXIUM) 40 MG capsule Take 1 capsule (40 mg total) by mouth daily as needed.   Evolocumab (REPATHA SURECLICK) XX123456 MG/ML SOAJ INJECT 1 PEN INTO THE SKIN EVERY 14 DAYS.   fexofenadine (ALLEGRA) 180 MG tablet Take 180 mg by mouth daily.   fluticasone-salmeterol (ADVAIR DISKUS) 100-50 MCG/ACT AEPB Inhale 1 puff into the lungs 2 (two) times daily.   Fluticasone-Salmeterol (ADVAIR DISKUS) 100-50 MCG/DOSE AEPB Inhale 1 puff into the lungs in the morning and at bedtime.   methocarbamol (ROBAXIN) 500 MG tablet Take 1 tablet (500 mg total) by mouth every 6 (six) hours  as needed.   montelukast (SINGULAIR) 10 MG tablet Take 1 tablet (10 mg total) by mouth daily.   Multiple Vitamin (MULTI VITAMIN DAILY PO) Take by mouth.   Omega-3 Fatty Acids (FISH OIL) 1000 MG CAPS Take 1,000 mg by mouth daily.   PROAIR HFA 108 (90 Base) MCG/ACT inhaler      Allergies:   Other, Penicillins, Skelaxin [metaxalone], and Doxycycline   Social History   Socioeconomic History   Marital status: Divorced    Spouse name: Not on file   Number of children: Not on file   Years of education: Not on file   Highest education level: Not on file  Occupational History   Not on file  Tobacco Use   Smoking status: Never   Smokeless tobacco: Never  Vaping Use   Vaping Use: Never used   Substance and Sexual Activity   Alcohol use: Yes    Comment: most days 4xweek   Drug use: No   Sexual activity: Not on file  Other Topics Concern   Not on file  Social History Narrative   Not on file   Social Determinants of Health   Financial Resource Strain: Not on file  Food Insecurity: Not on file  Transportation Needs: Not on file  Physical Activity: Not on file  Stress: Not on file  Social Connections: Not on file     Family History: The patient's family history includes Eczema in his father; Hypertension in his mother.  ROS:   Please see the history of present illness.    ROS  All other systems reviewed and negative.   EKGs/Labs/Other Studies Reviewed:    The following studies were reviewed today: none  EKG:  EKG is  ordered today.  The ekg ordered today demonstrates NSR with no ST changes  Recent Labs: No results found for requested labs within last 365 days.   Recent Lipid Panel    Component Value Date/Time   CHOL 138 07/30/2020 0928   TRIG 77 07/30/2020 0928   HDL 61 07/30/2020 0928   CHOLHDL 2.3 07/30/2020 0928   CHOLHDL 3 06/05/2015 0848   VLDL 23.2 06/05/2015 0848   LDLCALC 62 07/30/2020 0928    Physical Exam:    VS:  BP 120/82   Pulse 74   Ht 5' 6"$  (1.676 m)   Wt 247 lb 12.8 oz (112.4 kg)   SpO2 95%   BMI 40.00 kg/m     Wt Readings from Last 3 Encounters:  12/31/22 247 lb 12.8 oz (112.4 kg)  08/16/21 238 lb 6.4 oz (108.1 kg)  10/18/20 220 lb (99.8 kg)     GEN: Well nourished, well developed in no acute distress HEENT: Normal NECK: No JVD; No carotid bruits LYMPHATICS: No lymphadenopathy CARDIAC:RRR, no murmurs, rubs, gallops RESPIRATORY:  Clear to auscultation without rales, wheezing or rhonchi  ABDOMEN: Soft, non-tender, non-distended MUSCULOSKELETAL:  No edema; No deformity  SKIN: Warm and dry NEUROLOGIC:  Alert and oriented x 3 PSYCHIATRIC:  Normal affect   ASSESSMENT:    1. Persistent atrial fibrillation ([place])   2.  Primary hypertension   3. Pure hypercholesterolemia   4. Agatston coronary artery calcium score between 200 and 399   5. OSA (obstructive sleep apnea)    PLAN:    In order of problems listed above:  1.  Persistent atrial fibrillation  -he remains in NSR and denies any palpitations -continue prescription drug management with Cardizem CD 367m daily with PRN refills -His CHADS2VASC score is 1  and therefore not on anticoagulation.    2.  HTN  -BP controlled on exam today -continue prescription drug management with Cardizem CD 266m daily with PRN refills  3.  Hyperlipidemia  -LDL goal is < 70 given coronary Ca score of 373 which is 93rd% of age and and sex matched controls -he is intolerant to statins and is now on Repatha -He also has been battling with elevated LFTs and has been dx with fatty liver a few years ago with GI.  -I have personally reviewed and interpreted outside labs performed by patient's PCP which showed LDL 66 ad HDL 47 on 04/17/22 -continue prescription drug management with Repatha with PRN refills  4.  Coronary artery calcium -Calcium score was 373 which is 93rd% of age and and sex matched controls -needs aggressive risk factor modification -he has not had any anginal sx -continue Repatha>>statin intolerant -he is concerned about his borderline HbA1C and is interested in considering Ozempic>>I will refer to our PharmD  clinic  5.  OSA - The patient is tolerating PAP therapy well without any problems. The PAP download performed by his DME was personally reviewed and interpreted by me today and showed an AHI of 0.8/hr on auto CPAP from 4 to 15 cm H2O with 100% compliance in using more than 4 hours nightly.  The patient has been using and benefiting from PAP use and will continue to benefit from therapy.    Medication Adjustments/Labs and Tests Ordered: Current medicines are reviewed at length with the patient today.  Concerns regarding medicines are outlined above.   Orders Placed This Encounter  Procedures   EKG 12-Lead    No orders of the defined types were placed in this encounter.   Signed, TFransico Him MD  12/31/2022 8:58 AM    [facility]

## 2022-12-31 NOTE — Patient Instructions (Signed)
Medication Instructions:  Your physician recommends that you continue on your current medications as directed. Please refer to the Current Medication list given to you today.  *If you need a refill on your cardiac medications before your next appointment, please call your pharmacy*   Lab Work: None.  If you have labs (blood work) drawn today and your tests are completely normal, you will receive your results only by: [place] (if you have MyChart) OR A paper copy in the mail If you have any lab test that is abnormal or we need to change your treatment, we will call you to review the results.   Testing/Procedures: None.   Follow-Up: We recommend signing up for the patient portal called "MyChart".  Sign up information is provided on this After Visit Summary.  MyChart is used to connect with patients for Virtual Visits (Telemedicine).  Patients are able to view lab/test results, encounter notes, upcoming appointments, etc.  Non-urgent messages can be sent to your provider as well.   To learn more about what you can do with MyChart, go to [email or web address].    Your next appointment:   1 year(s)  Provider:   Fransico Him, MD     Other Instructions You have been referred to our pharmacist to discuss whether you qualify for treatment with Ozempic. Someone will call you to schedule this appointment.

## 2023-01-13 DIAGNOSIS — G4733 Obstructive sleep apnea (adult) (pediatric): Secondary | ICD-10-CM | POA: Diagnosis not present

## 2023-02-03 ENCOUNTER — Other Ambulatory Visit: Payer: Self-pay | Admitting: Cardiology

## 2023-02-03 ENCOUNTER — Other Ambulatory Visit (HOSPITAL_BASED_OUTPATIENT_CLINIC_OR_DEPARTMENT_OTHER): Payer: Self-pay

## 2023-02-03 MED ORDER — DILTIAZEM HCL ER COATED BEADS 360 MG PO CP24
360.0000 mg | ORAL_CAPSULE | Freq: Every day | ORAL | 3 refills | Status: DC
  Filled 2023-02-03: qty 90, 90d supply, fill #0
  Filled 2023-04-29: qty 90, 90d supply, fill #1
  Filled 2023-07-29: qty 90, 90d supply, fill #2
  Filled 2023-10-29: qty 90, 90d supply, fill #3

## 2023-02-04 ENCOUNTER — Telehealth: Payer: Self-pay | Admitting: Pharmacist

## 2023-02-04 ENCOUNTER — Ambulatory Visit: Payer: 59 | Attending: Cardiology | Admitting: Student

## 2023-02-04 DIAGNOSIS — E6609 Other obesity due to excess calories: Secondary | ICD-10-CM

## 2023-02-04 LAB — HEMOGLOBIN A1C
Est. average glucose Bld gHb Est-mCnc: 134 mg/dL
Hgb A1c MFr Bld: 6.3 % — ABNORMAL HIGH (ref 4.8–5.6)

## 2023-02-04 NOTE — Progress Notes (Signed)
HPI: Terry Kim is a 62 y.o. male patient referred to pharmacy clinic by Dr. Radford Pax to initiate weight loss therapy with GLP1-RA.  Most recent BMI 40. PMH significant for Persistent atrial fibrillation (CHADS2VASC score 1), HTN, dyslipidemia, coronary artery calcium with Ca score score of 373 which is 93rd% of age and and sex matched controls and obesity.   Patient presented today to discuss weight loss/ prediabetes medications. Reports he has tried various diets and able to loose weight but unable to maintain healthy weight. In the past with Keto Diet he has lost around 45 lbs but having strong family hx of colon cancer went off of Keto Diet. 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 He is pre-diabetic for long time and curious about metformin. Does metformin have positive effect on weight? Inform patient metformin could lead to small weight loss in some patient and for some patients it is weight neutral. We discussed GLP1 agent dosage, administration techniques and potential side effects and its management. Also insurance coverage for GLP1 agent for different indications. Patient wants to repeat A1c to see has his A1c improved or worsen.  Confirmed patient has no personal or family history of medullary thyroid carcinoma (MTC) or Multiple Endocrine Neoplasia syndrome type 2 (MEN 2).    Current weight management medications: none   Previously tried meds: none   Current meds that may affect weight: none   Baseline weight/BMI: 112.4 kg/BMI 40  Insurance payor: [employer]  Diet:   low fat low salt more plant based whole food Drink: water, unsweeten tea, back coffee   Alcohol: occasional ( beer or wine)  Smoking: never    Exercise: arthritis back and knee limits the exercise  Lot of inflammation , loves hiking an gardening  thinking to restart as  weather gets better    Family History: father :bowel cancer  Both side GF - bowel cancer  Mother side family- HTN, heart attacks       Labs: Lab Results  Component Value Date   HGBA1C 6.3 (H) 02/04/2023     Wt Readings from Last 1 Encounters:  12/31/22 247 lb 12.8 oz (112.4 kg)    BP Readings from Last 1 Encounters:  12/31/22 120/82   Pulse Readings from Last 1 Encounters:  12/31/22 74       Component Value Date/Time   CHOL 138 07/30/2020 0928   TRIG 77 07/30/2020 0928   HDL 61 07/30/2020 0928   CHOLHDL 2.3 07/30/2020 0928   CHOLHDL 3 06/05/2015 0848   VLDL 23.2 06/05/2015 0848   LDLCALC 62 07/30/2020 0928    Past Medical History:  Diagnosis Date   Hypertension    Obesity    Persistent atrial fibrillation (HCC)    CHADS2VASC score is 1   Pure hypercholesterolemia    Supraventricular premature beats     Current Outpatient Medications on File Prior to Visit  Medication Sig Dispense Refill   albuterol (VENTOLIN HFA) 108 (90 Base) MCG/ACT inhaler Inhale 2 puffs into the lungs every 6 (six) hours. 25.5 g 4   aspirin 81 MG tablet Take 81 mg by mouth daily.      diltiazem (CARDIZEM CD) 360 MG 24 hr capsule Take 1 capsule (360 mg total) by mouth daily. 90 capsule 3   esomeprazole (NEXIUM) 40 MG capsule Take 1 capsule (40 mg total)  by mouth daily as needed. 90 capsule 2   Evolocumab (REPATHA SURECLICK) XX123456 MG/ML SOAJ INJECT 1 PEN INTO THE SKIN EVERY 14 DAYS. 2 mL 11   fexofenadine (ALLEGRA) 180 MG tablet Take 180 mg by mouth daily.     fluticasone-salmeterol (ADVAIR DISKUS) 100-50 MCG/ACT AEPB Inhale 1 puff into the lungs 2 (two) times daily. 180 each 0   Fluticasone-Salmeterol (ADVAIR DISKUS) 100-50 MCG/DOSE AEPB Inhale 1 puff into the lungs in the morning and at bedtime. 60 each 5   methocarbamol (ROBAXIN) 500 MG tablet Take 1 tablet (500 mg total) by mouth every 6 (six) hours as needed. 120 tablet 1   montelukast (SINGULAIR) 10 MG tablet Take 1 tablet (10 mg  total) by mouth daily. 90 tablet 3   Multiple Vitamin (MULTI VITAMIN DAILY PO) Take by mouth.     Omega-3 Fatty Acids (FISH OIL) 1000 MG CAPS Take 1,000 mg by mouth daily.     PROAIR HFA 108 (90 Base) MCG/ACT inhaler      No current facility-administered medications on file prior to visit.    Allergies  Allergen Reactions   Other     Myalgias on 10mg  every other day   Penicillins Hives   Skelaxin [Metaxalone] Itching   Doxycycline Rash    Assessment/Plan  Patient is currently not on any weight loss or pre-diabetic medications  Had tried various diets with successful results but unable to maintain the healthy weight  Current BMI 40 and Weight 112.4 kg  Will reassess A1c today Current insurance does not cover GLP1 for weight loss  If A1c remains in pre-diabetic range patient could consider Restaurant manager, fast food pharmacy support program from  [employer] as long as it is not cost prohibitive for the patient    Cammy Copa, Pharm.D [facility] A Division of [facility] [address], [place], [place] [address]  Phone: [phone]; Fax: [phone]

## 2023-02-04 NOTE — Telephone Encounter (Signed)
Coming today for GLP1 consultation at 11:30. Lst A1c ,8 months ago was 6.1. need to get updated A1c. Patient informed to come to the appointment little early for lab.

## 2023-02-05 ENCOUNTER — Telehealth: Payer: Self-pay | Admitting: Cardiology

## 2023-02-05 ENCOUNTER — Other Ambulatory Visit (HOSPITAL_BASED_OUTPATIENT_CLINIC_OR_DEPARTMENT_OTHER): Payer: Self-pay

## 2023-02-05 DIAGNOSIS — E7439 Other disorders of intestinal carbohydrate absorption: Secondary | ICD-10-CM

## 2023-02-05 NOTE — Telephone Encounter (Signed)
Pt advised. Sent to his PCP Dr Melinda Crutch.

## 2023-02-05 NOTE — Telephone Encounter (Signed)
Patient is returning call in regards to labs. Requesting return call.  

## 2023-02-05 NOTE — Telephone Encounter (Addendum)
Called patient to discuss GLP1 option for weight loss. N/A  I saw patient yesterday, previous A1c was in pre-diabetic range and patient is interested in losing weight so re-checked A1c to see if patient get qualify for GLP1 for diabetes.  Now that A1c is still in pre-diabetic range and [facility] employee insurance does not cover GLP1 for weight loss,called patient to inform.

## 2023-02-05 NOTE — Telephone Encounter (Signed)
My Chart message sent to the pt per the Pharm D message.

## 2023-02-06 ENCOUNTER — Encounter: Payer: Self-pay | Admitting: Student

## 2023-02-10 ENCOUNTER — Other Ambulatory Visit (HOSPITAL_BASED_OUTPATIENT_CLINIC_OR_DEPARTMENT_OTHER): Payer: Self-pay

## 2023-02-10 DIAGNOSIS — E7439 Other disorders of intestinal carbohydrate absorption: Secondary | ICD-10-CM | POA: Insufficient documentation

## 2023-02-10 MED ORDER — METFORMIN HCL 500 MG PO TABS
500.0000 mg | ORAL_TABLET | Freq: Two times a day (BID) | ORAL | 5 refills | Status: DC
  Filled 2023-02-10: qty 60, 30d supply, fill #0
  Filled 2023-03-17: qty 60, 30d supply, fill #1
  Filled 2023-04-26: qty 60, 30d supply, fill #2
  Filled 2023-06-05: qty 180, 90d supply, fill #3

## 2023-02-10 NOTE — Telephone Encounter (Signed)
Spoke with patient. Worried regarding increasing A1c and would like to start metformin. Last LFTs and renal function normal per [facility]. Will start 500mg  BID. Advised common side effects were GI. Patient will call back with any concerns.

## 2023-02-10 NOTE — Addendum Note (Signed)
Addended by: Rollen Sox on: 02/10/2023 03:23 PM   Modules accepted: Orders

## 2023-02-11 DIAGNOSIS — G4733 Obstructive sleep apnea (adult) (pediatric): Secondary | ICD-10-CM | POA: Diagnosis not present

## 2023-03-13 DIAGNOSIS — G4733 Obstructive sleep apnea (adult) (pediatric): Secondary | ICD-10-CM | POA: Diagnosis not present

## 2023-04-12 DIAGNOSIS — G4733 Obstructive sleep apnea (adult) (pediatric): Secondary | ICD-10-CM | POA: Diagnosis not present

## 2023-04-29 ENCOUNTER — Other Ambulatory Visit (HOSPITAL_BASED_OUTPATIENT_CLINIC_OR_DEPARTMENT_OTHER): Payer: Self-pay

## 2023-04-29 ENCOUNTER — Other Ambulatory Visit: Payer: Self-pay

## 2023-04-29 MED ORDER — METHOCARBAMOL 500 MG PO TABS
500.0000 mg | ORAL_TABLET | ORAL | 0 refills | Status: AC
  Filled 2023-04-29: qty 120, 20d supply, fill #0

## 2023-05-11 ENCOUNTER — Other Ambulatory Visit (HOSPITAL_BASED_OUTPATIENT_CLINIC_OR_DEPARTMENT_OTHER): Payer: Self-pay

## 2023-05-13 DIAGNOSIS — G4733 Obstructive sleep apnea (adult) (pediatric): Secondary | ICD-10-CM | POA: Diagnosis not present

## 2023-05-30 ENCOUNTER — Other Ambulatory Visit (HOSPITAL_BASED_OUTPATIENT_CLINIC_OR_DEPARTMENT_OTHER): Payer: Self-pay

## 2023-06-05 ENCOUNTER — Other Ambulatory Visit (HOSPITAL_BASED_OUTPATIENT_CLINIC_OR_DEPARTMENT_OTHER): Payer: Self-pay

## 2023-06-09 ENCOUNTER — Other Ambulatory Visit (HOSPITAL_BASED_OUTPATIENT_CLINIC_OR_DEPARTMENT_OTHER): Payer: Self-pay

## 2023-06-11 DIAGNOSIS — G4733 Obstructive sleep apnea (adult) (pediatric): Secondary | ICD-10-CM | POA: Diagnosis not present

## 2023-06-12 ENCOUNTER — Other Ambulatory Visit (HOSPITAL_BASED_OUTPATIENT_CLINIC_OR_DEPARTMENT_OTHER): Payer: Self-pay

## 2023-07-12 DIAGNOSIS — G4733 Obstructive sleep apnea (adult) (pediatric): Secondary | ICD-10-CM | POA: Diagnosis not present

## 2023-07-29 ENCOUNTER — Other Ambulatory Visit (HOSPITAL_BASED_OUTPATIENT_CLINIC_OR_DEPARTMENT_OTHER): Payer: Self-pay

## 2023-07-29 MED ORDER — MONTELUKAST SODIUM 10 MG PO TABS
10.0000 mg | ORAL_TABLET | Freq: Every day | ORAL | 4 refills | Status: DC
  Filled 2023-07-29: qty 90, 90d supply, fill #0

## 2023-08-12 ENCOUNTER — Other Ambulatory Visit (HOSPITAL_BASED_OUTPATIENT_CLINIC_OR_DEPARTMENT_OTHER): Payer: Self-pay

## 2023-08-12 DIAGNOSIS — G4733 Obstructive sleep apnea (adult) (pediatric): Secondary | ICD-10-CM | POA: Diagnosis not present

## 2023-09-09 DIAGNOSIS — G4733 Obstructive sleep apnea (adult) (pediatric): Secondary | ICD-10-CM | POA: Diagnosis not present

## 2023-09-10 ENCOUNTER — Other Ambulatory Visit (HOSPITAL_BASED_OUTPATIENT_CLINIC_OR_DEPARTMENT_OTHER): Payer: Self-pay

## 2023-09-10 MED ORDER — FLULAVAL 0.5 ML IM SUSY
0.5000 mL | PREFILLED_SYRINGE | Freq: Once | INTRAMUSCULAR | 0 refills | Status: AC
  Filled 2023-09-10: qty 0.5, 1d supply, fill #0

## 2023-09-17 ENCOUNTER — Other Ambulatory Visit: Payer: Self-pay | Admitting: Pharmacist

## 2023-09-17 ENCOUNTER — Other Ambulatory Visit (HOSPITAL_BASED_OUTPATIENT_CLINIC_OR_DEPARTMENT_OTHER): Payer: Self-pay

## 2023-09-17 DIAGNOSIS — E7439 Other disorders of intestinal carbohydrate absorption: Secondary | ICD-10-CM

## 2023-09-17 MED ORDER — METFORMIN HCL 500 MG PO TABS
500.0000 mg | ORAL_TABLET | Freq: Two times a day (BID) | ORAL | 5 refills | Status: DC
  Filled 2023-09-17: qty 60, 30d supply, fill #0
  Filled 2023-10-17 (×2): qty 60, 30d supply, fill #1
  Filled 2023-12-15: qty 60, 30d supply, fill #2
  Filled 2024-01-15: qty 60, 30d supply, fill #3
  Filled 2024-03-03: qty 60, 30d supply, fill #4
  Filled 2024-04-22: qty 60, 30d supply, fill #5

## 2023-09-21 ENCOUNTER — Other Ambulatory Visit (HOSPITAL_BASED_OUTPATIENT_CLINIC_OR_DEPARTMENT_OTHER): Payer: Self-pay

## 2023-09-21 MED ORDER — CLINDAMYCIN HCL 150 MG PO CAPS
150.0000 mg | ORAL_CAPSULE | Freq: Four times a day (QID) | ORAL | 0 refills | Status: AC
  Filled 2023-09-21: qty 28, 7d supply, fill #0

## 2023-10-07 DIAGNOSIS — Z13220 Encounter for screening for lipoid disorders: Secondary | ICD-10-CM | POA: Diagnosis not present

## 2023-10-07 DIAGNOSIS — Z0000 Encounter for general adult medical examination without abnormal findings: Secondary | ICD-10-CM | POA: Diagnosis not present

## 2023-10-07 DIAGNOSIS — R7303 Prediabetes: Secondary | ICD-10-CM | POA: Diagnosis not present

## 2023-10-07 DIAGNOSIS — Z125 Encounter for screening for malignant neoplasm of prostate: Secondary | ICD-10-CM | POA: Diagnosis not present

## 2023-10-10 DIAGNOSIS — G4733 Obstructive sleep apnea (adult) (pediatric): Secondary | ICD-10-CM | POA: Diagnosis not present

## 2023-10-12 ENCOUNTER — Other Ambulatory Visit (HOSPITAL_BASED_OUTPATIENT_CLINIC_OR_DEPARTMENT_OTHER): Payer: Self-pay

## 2023-10-12 DIAGNOSIS — E7800 Pure hypercholesterolemia, unspecified: Secondary | ICD-10-CM | POA: Diagnosis not present

## 2023-10-12 DIAGNOSIS — R7303 Prediabetes: Secondary | ICD-10-CM | POA: Diagnosis not present

## 2023-10-12 DIAGNOSIS — Z23 Encounter for immunization: Secondary | ICD-10-CM | POA: Diagnosis not present

## 2023-10-12 DIAGNOSIS — J45909 Unspecified asthma, uncomplicated: Secondary | ICD-10-CM | POA: Diagnosis not present

## 2023-10-12 DIAGNOSIS — R635 Abnormal weight gain: Secondary | ICD-10-CM | POA: Diagnosis not present

## 2023-10-12 DIAGNOSIS — R899 Unspecified abnormal finding in specimens from other organs, systems and tissues: Secondary | ICD-10-CM | POA: Diagnosis not present

## 2023-10-12 DIAGNOSIS — Z0000 Encounter for general adult medical examination without abnormal findings: Secondary | ICD-10-CM | POA: Diagnosis not present

## 2023-10-12 MED ORDER — MONTELUKAST SODIUM 10 MG PO TABS
10.0000 mg | ORAL_TABLET | Freq: Every day | ORAL | 4 refills | Status: AC
  Filled 2023-10-12: qty 90, 90d supply, fill #0
  Filled 2024-03-03: qty 90, 90d supply, fill #1
  Filled 2024-06-23: qty 90, 90d supply, fill #2

## 2023-10-12 MED ORDER — JARDIANCE 10 MG PO TABS
10.0000 mg | ORAL_TABLET | Freq: Every day | ORAL | 12 refills | Status: AC
  Filled 2023-10-12: qty 30, 30d supply, fill #0
  Filled 2023-11-12: qty 30, 30d supply, fill #1
  Filled 2023-12-15: qty 30, 30d supply, fill #2
  Filled 2024-01-15: qty 30, 30d supply, fill #3
  Filled 2024-02-15: qty 30, 30d supply, fill #4
  Filled 2024-03-21: qty 30, 30d supply, fill #5
  Filled 2024-04-22: qty 30, 30d supply, fill #6
  Filled 2024-05-19: qty 30, 30d supply, fill #7
  Filled 2024-06-23: qty 30, 30d supply, fill #8
  Filled 2024-07-26: qty 30, 30d supply, fill #9
  Filled 2024-08-25: qty 30, 30d supply, fill #10
  Filled 2024-09-27: qty 30, 30d supply, fill #11
  Filled 2024-09-28 – 2024-10-01 (×2): qty 30, 30d supply, fill #0

## 2023-10-12 MED ORDER — ALBUTEROL SULFATE HFA 108 (90 BASE) MCG/ACT IN AERS
2.0000 | INHALATION_SPRAY | Freq: Four times a day (QID) | RESPIRATORY_TRACT | 4 refills | Status: AC
  Filled 2023-10-12: qty 20.1, 90d supply, fill #0

## 2023-10-12 MED ORDER — FLUTICASONE-SALMETEROL 100-50 MCG/ACT IN AEPB
1.0000 | INHALATION_SPRAY | Freq: Two times a day (BID) | RESPIRATORY_TRACT | 2 refills | Status: DC
  Filled 2023-10-12 – 2023-12-03 (×3): qty 180, 90d supply, fill #0

## 2023-10-13 ENCOUNTER — Other Ambulatory Visit: Payer: Self-pay

## 2023-10-13 ENCOUNTER — Other Ambulatory Visit (HOSPITAL_BASED_OUTPATIENT_CLINIC_OR_DEPARTMENT_OTHER): Payer: Self-pay

## 2023-10-13 LAB — LAB REPORT - SCANNED
A1c: 6.3
EGFR: 91

## 2023-10-17 ENCOUNTER — Other Ambulatory Visit (HOSPITAL_BASED_OUTPATIENT_CLINIC_OR_DEPARTMENT_OTHER): Payer: Self-pay

## 2023-10-17 ENCOUNTER — Other Ambulatory Visit: Payer: Self-pay

## 2023-10-22 ENCOUNTER — Other Ambulatory Visit (HOSPITAL_BASED_OUTPATIENT_CLINIC_OR_DEPARTMENT_OTHER): Payer: Self-pay

## 2023-10-29 ENCOUNTER — Other Ambulatory Visit (HOSPITAL_COMMUNITY): Payer: Self-pay

## 2023-10-29 ENCOUNTER — Other Ambulatory Visit: Payer: Self-pay

## 2023-11-09 DIAGNOSIS — G4733 Obstructive sleep apnea (adult) (pediatric): Secondary | ICD-10-CM | POA: Diagnosis not present

## 2023-12-03 ENCOUNTER — Other Ambulatory Visit (HOSPITAL_BASED_OUTPATIENT_CLINIC_OR_DEPARTMENT_OTHER): Payer: Self-pay

## 2023-12-04 ENCOUNTER — Other Ambulatory Visit (HOSPITAL_BASED_OUTPATIENT_CLINIC_OR_DEPARTMENT_OTHER): Payer: Self-pay

## 2023-12-15 ENCOUNTER — Other Ambulatory Visit (HOSPITAL_BASED_OUTPATIENT_CLINIC_OR_DEPARTMENT_OTHER): Payer: Self-pay

## 2023-12-16 ENCOUNTER — Other Ambulatory Visit (HOSPITAL_BASED_OUTPATIENT_CLINIC_OR_DEPARTMENT_OTHER): Payer: Self-pay

## 2024-02-01 ENCOUNTER — Other Ambulatory Visit: Payer: Self-pay | Admitting: Cardiology

## 2024-02-02 ENCOUNTER — Other Ambulatory Visit: Payer: Self-pay | Admitting: Cardiology

## 2024-02-02 ENCOUNTER — Other Ambulatory Visit (HOSPITAL_BASED_OUTPATIENT_CLINIC_OR_DEPARTMENT_OTHER): Payer: Self-pay

## 2024-02-02 MED ORDER — DILTIAZEM HCL ER COATED BEADS 360 MG PO CP24
360.0000 mg | ORAL_CAPSULE | Freq: Every day | ORAL | 0 refills | Status: DC
  Filled 2024-02-02: qty 30, 30d supply, fill #0

## 2024-02-04 ENCOUNTER — Other Ambulatory Visit (HOSPITAL_BASED_OUTPATIENT_CLINIC_OR_DEPARTMENT_OTHER): Payer: Self-pay

## 2024-02-10 NOTE — Progress Notes (Unsigned)
 Cardiology Office Note:    Date:  02/11/2024   ID:  Terry Kim, DOB 10/31/1961, MRN [id number]  PCP:  Duane Lope (Inactive)  Cardiologist:  Armanda Magic, MD    Referring MD: No ref. provider found   Chief Complaint  Patient presents with   Atrial Fibrillation   Hypertension   Hyperlipidemia   Sleep Apnea   Coronary Artery Disease   History of Present Illness:    Terry Kim is a 63 y.o. male with a hx of Persistent atrial fibrillation (CHADS2VASC score 1), HTN, dyslipidemia, coronary artery calcium with Ca score score of 373 which is 93rd% of age and and sex matched controls and obesity. He also had a sleep study done showing moderate OSA with an AHI of 18.9/hr and O2 sats as low as 80%. He underwent CPAP titration to 17cm H2O and is now here for followup.    He is here today for followup and is doing well.  He denies any chest pain or pressure, SOB, DOE, PND, orthopnea, LE edema, dizziness, palpitations or syncope. He is compliant with his meds and is tolerating meds with no SE.      He is doing well with his PAP device and thinks that he has gotten used to it.  He tolerates the mask and feels the pressure is adequate.  Since going on PAP he feels rested in the am and has no significant daytime sleepiness.  He denies any significant mouth or nasal dryness or nasal congestion.  HE does not think that he snores.    Past Medical History:  Diagnosis Date   Hypertension    Obesity    Persistent atrial fibrillation (HCC)    CHADS2VASC score is 1   Pure hypercholesterolemia    Supraventricular premature beats     Past Surgical History:  Procedure Laterality Date   KNEE ARTHROSCOPY WITH MEDIAL MENISECTOMY Left 07/05/2014   Procedure: LEFT ARTHROSCOPY KNEE WITH MEDIAL MENISECTOMY/DEBRIDEMENT/SHAVING (CHONDROPLASTY)/ARTHROSCOPY KNEE ABRASION ARTHROPLASTY/MULTIPLE DRILLING;  Surgeon: Thera Flake., MD;  Location:  SURGERY CENTER;  Service: Orthopedics;  Laterality: Left;    SHOULDER ARTHROSCOPY  2000   right   SHOULDER ARTHROSCOPY W/ ROTATOR CUFF REPAIR  2012   left   TONSILLECTOMY      Current Medications: Current Meds  Medication Sig   albuterol (PROAIR HFA) 108 (90 Base) MCG/ACT inhaler Inhale 2 puffs into the lungs every 6 (six) hours as needed   albuterol (VENTOLIN HFA) 108 (90 Base) MCG/ACT inhaler Inhale 2 puffs into the lungs every 6 (six) hours.   aspirin 81 MG tablet Take 81 mg by mouth daily.    clindamycin (CLEOCIN) 150 MG capsule Take 1 capsule (150 mg total) by mouth every 6 (six) hours.   diltiazem (CARDIZEM CD) 360 MG 24 hr capsule Take 1 capsule (360 mg total) by mouth daily.   empagliflozin (JARDIANCE) 10 MG TABS tablet Take 1 tablet (10 mg total) by mouth daily.   esomeprazole (NEXIUM) 40 MG capsule Take 1 capsule (40 mg total) by mouth daily as needed.   Evolocumab (REPATHA) 140 MG/ML SOSY Inject 140 mg into the skin every 14 (fourteen) days.   fexofenadine (ALLEGRA) 180 MG tablet Take 180 mg by mouth daily.   Fluticasone-Salmeterol (ADVAIR DISKUS) 100-50 MCG/DOSE AEPB Inhale 1 puff into the lungs in the morning and at bedtime.   metFORMIN (GLUCOPHAGE) 500 MG tablet Take 1 tablet (500 mg total) by mouth 2 (two) times daily with a meal.  methocarbamol (ROBAXIN) 500 MG tablet Take 1 tablet (500 mg total) by mouth every 4 (four) hours as needed.   montelukast (SINGULAIR) 10 MG tablet Take 1 tablet (10 mg total) by mouth daily.   Multiple Vitamin (MULTI VITAMIN DAILY PO) Take by mouth.   Omega-3 Fatty Acids (FISH OIL) 1000 MG CAPS Take 1,000 mg by mouth daily.   PROAIR HFA 108 (90 Base) MCG/ACT inhaler      Allergies:   Other, Penicillins, Skelaxin [metaxalone], and Doxycycline   Social History   Socioeconomic History   Marital status: Divorced    Spouse name: Not on file   Number of children: Not on file   Years of education: Not on file   Highest education level: Not on file  Occupational History   Not on file  Tobacco Use    Smoking status: Never   Smokeless tobacco: Never  Vaping Use   Vaping status: Never Used  Substance and Sexual Activity   Alcohol use: Yes    Comment: most days 4xweek   Drug use: No   Sexual activity: Not on file  Other Topics Concern   Not on file  Social History Narrative   Not on file   Social Drivers of Health   Financial Resource Strain: Not on file  Food Insecurity: Not on file  Transportation Needs: Not on file  Physical Activity: Not on file  Stress: Not on file  Social Connections: Not on file     Family History: The patient's family history includes Eczema in his father; Hypertension in his mother.  ROS:   Please see the history of present illness.    ROS  All other systems reviewed and negative.   EKGs/Labs/Other Studies Reviewed:    The following studies were reviewed today: none   Recent Labs: No results found for requested labs within last 365 days.   Recent Lipid Panel    Component Value Date/Time   CHOL 138 07/30/2020 0928   TRIG 77 07/30/2020 0928   HDL 61 07/30/2020 0928   CHOLHDL 2.3 07/30/2020 0928   CHOLHDL 3 06/05/2015 0848   VLDL 23.2 06/05/2015 0848   LDLCALC 62 07/30/2020 0928    Physical Exam:    VS:  BP (!) 134/94   Pulse 85   Ht 5\' 6"  (1.676 m)   Wt 241 lb 3.2 oz (109.4 kg)   SpO2 95%   BMI 38.93 kg/m     Wt Readings from Last 3 Encounters:  02/11/24 241 lb 3.2 oz (109.4 kg)  12/31/22 247 lb 12.8 oz (112.4 kg)  08/16/21 238 lb 6.4 oz (108.1 kg)    GEN: Well nourished, well developed in no acute distress HEENT: Normal NECK: No JVD; No carotid bruits LYMPHATICS: No lymphadenopathy CARDIAC:RRR, no murmurs, rubs, gallops RESPIRATORY:  Clear to auscultation without rales, wheezing or rhonchi  ABDOMEN: Soft, non-tender, non-distended MUSCULOSKELETAL:  No edema; No deformity  SKIN: Warm and dry NEUROLOGIC:  Alert and oriented x 3 PSYCHIATRIC:  Normal affect  ASSESSMENT:    1. Persistent atrial fibrillation (HCC)    2. Primary hypertension   3. Pure hypercholesterolemia   4. Agatston coronary artery calcium score between 200 and 399   5. OSA (obstructive sleep apnea)     PLAN:    In order of problems listed above:  1.  Paroxysmal atrial fibrillation  -He is in normal sinus rhythm today and denies any palpitations since he was last seen -Continue prescription drug management Cardizem CD 360 mg  daily with as needed refills -His CHADS2VASC score is 1 and therefore not on anticoagulation.    2.  HTN  -BP borderline controlled on exam today but he is under a lot of stress from his mom being ill and usually is controled -Continue prescription drug management with Cardizem CD 360 mg daily with as needed refills  3.  Hyperlipidemia  -LDL goal is < 70 given coronary Ca score of 373 which is 93rd% of age and and sex matched controls -he is intolerant to statins and is now on Repatha -He also has been battling with elevated LFTs and has been dx with fatty liver a few years ago with GI.  -I have personally reviewed and interpreted outside labs performed by patient's PCP which showed LDL 56 on 10/07/2023 -Continue Prescription drug management with Repatha with PRN refills -I will get a copy of CMET from PCP  4.  Coronary artery calcium -Calcium score was 373 which is 93rd% of age and and sex matched controls -needs aggressive risk factor modification -He denies any anginal symptoms since he was last seen by [place] -statin intolerant -Continue Repatha  -he would like to get on weight loss meds but insurance denied Ozempic -I will see if our PharmD can get him approved based on moderate OSA  5.  OSA - The patient is tolerating PAP therapy well without any problems. The PAP download performed by his DME was personally reviewed and interpreted by me today and showed an AHI of 0.6/hr on auto CPAP from 4 to 15 cm H2O with 100% compliance in using more than 4 hours nightly.  The patient has been using and  benefiting from PAP use and will continue to benefit from therapy.    Medication Adjustments/Labs and Tests Ordered: Current medicines are reviewed at length with the patient today.  Concerns regarding medicines are outlined above.  No orders of the defined types were placed in this encounter.   No orders of the defined types were placed in this encounter.   Signed, Armanda Magic, MD  02/11/2024 10:36 AM    [facility]

## 2024-02-11 ENCOUNTER — Telehealth: Payer: Self-pay | Admitting: Pharmacy Technician

## 2024-02-11 ENCOUNTER — Encounter: Payer: Self-pay | Admitting: Cardiology

## 2024-02-11 ENCOUNTER — Telehealth: Payer: Self-pay | Admitting: Pharmacist

## 2024-02-11 ENCOUNTER — Other Ambulatory Visit (HOSPITAL_COMMUNITY): Payer: Self-pay

## 2024-02-11 ENCOUNTER — Ambulatory Visit: Payer: Self-pay | Attending: Cardiology | Admitting: Cardiology

## 2024-02-11 VITALS — BP 134/94 | HR 85 | Ht 66.0 in | Wt 241.2 lb

## 2024-02-11 DIAGNOSIS — I10 Essential (primary) hypertension: Secondary | ICD-10-CM

## 2024-02-11 DIAGNOSIS — I4819 Other persistent atrial fibrillation: Secondary | ICD-10-CM | POA: Diagnosis not present

## 2024-02-11 DIAGNOSIS — E7800 Pure hypercholesterolemia, unspecified: Secondary | ICD-10-CM

## 2024-02-11 DIAGNOSIS — R931 Abnormal findings on diagnostic imaging of heart and coronary circulation: Secondary | ICD-10-CM

## 2024-02-11 DIAGNOSIS — G4733 Obstructive sleep apnea (adult) (pediatric): Secondary | ICD-10-CM

## 2024-02-11 NOTE — Telephone Encounter (Signed)
-----   Message from Armanda Magic sent at 02/11/2024 10:37 AM EDT ----- This patient has moderate OSA and insurance in the past would not approve it - can you work on this and see if we can get him approved for weight loss drug given his moderate OSA

## 2024-02-11 NOTE — Patient Instructions (Signed)
Medication Instructions:  Your physician recommends that you continue on your current medications as directed. Please refer to the Current Medication list given to you today.  *If you need a refill on your cardiac medications before your next appointment, please call your pharmacy*  Follow-Up: At [facility], you and your health needs are our priority.  As part of our continuing mission to provide you with exceptional heart care, we have created designated Provider Care Teams.  These Care Teams include your primary Cardiologist (physician) and Advanced Practice Providers (APPs -  Physician Assistants and Nurse Practitioners) who all work together to provide you with the care you need, when you need it.  Your next appointment:   1 year  Provider:   Armanda Magic, MD

## 2024-02-11 NOTE — Telephone Encounter (Signed)
 Please complete PA for Zepbound. Obstructive sleep apnea diagnosis

## 2024-02-11 NOTE — Telephone Encounter (Signed)
 Pharmacy Patient Advocate Encounter   Received notification from Pt Calls Messages that prior authorization for Zepbound is required/requested.   Insurance verification completed.   The patient is insured through [facility] .   Per test claim: PA required and submitted KEY/EOC/Request #: [id number] DENIED.  Not covered- excluded drug

## 2024-03-03 ENCOUNTER — Other Ambulatory Visit: Payer: Self-pay | Admitting: Cardiology

## 2024-03-03 ENCOUNTER — Other Ambulatory Visit (HOSPITAL_BASED_OUTPATIENT_CLINIC_OR_DEPARTMENT_OTHER): Payer: Self-pay

## 2024-03-03 MED ORDER — DILTIAZEM HCL ER COATED BEADS 360 MG PO CP24
360.0000 mg | ORAL_CAPSULE | Freq: Every day | ORAL | 3 refills | Status: AC
  Filled 2024-03-03: qty 90, 90d supply, fill #0
  Filled 2024-06-02: qty 90, 90d supply, fill #1
  Filled 2024-09-01: qty 90, 90d supply, fill #2
  Filled 2024-11-30: qty 90, 90d supply, fill #3

## 2024-03-07 DIAGNOSIS — G4733 Obstructive sleep apnea (adult) (pediatric): Secondary | ICD-10-CM | POA: Diagnosis not present

## 2024-03-08 ENCOUNTER — Other Ambulatory Visit (HOSPITAL_BASED_OUTPATIENT_CLINIC_OR_DEPARTMENT_OTHER): Payer: Self-pay

## 2024-04-23 ENCOUNTER — Other Ambulatory Visit (HOSPITAL_BASED_OUTPATIENT_CLINIC_OR_DEPARTMENT_OTHER): Payer: Self-pay

## 2024-04-25 ENCOUNTER — Other Ambulatory Visit (HOSPITAL_BASED_OUTPATIENT_CLINIC_OR_DEPARTMENT_OTHER): Payer: Self-pay

## 2024-04-25 MED ORDER — FLUTICASONE-SALMETEROL 100-50 MCG/ACT IN AEPB
1.0000 | INHALATION_SPRAY | Freq: Two times a day (BID) | RESPIRATORY_TRACT | 2 refills | Status: AC
  Filled 2024-04-25 – 2024-05-11 (×2): qty 180, 90d supply, fill #0
  Filled 2024-10-09: qty 180, 90d supply, fill #1

## 2024-05-06 ENCOUNTER — Other Ambulatory Visit (HOSPITAL_BASED_OUTPATIENT_CLINIC_OR_DEPARTMENT_OTHER): Payer: Self-pay

## 2024-05-11 ENCOUNTER — Other Ambulatory Visit: Payer: Self-pay

## 2024-05-11 ENCOUNTER — Other Ambulatory Visit (HOSPITAL_BASED_OUTPATIENT_CLINIC_OR_DEPARTMENT_OTHER): Payer: Self-pay

## 2024-05-11 ENCOUNTER — Encounter (HOSPITAL_BASED_OUTPATIENT_CLINIC_OR_DEPARTMENT_OTHER): Payer: Self-pay

## 2024-05-13 ENCOUNTER — Other Ambulatory Visit (HOSPITAL_BASED_OUTPATIENT_CLINIC_OR_DEPARTMENT_OTHER): Payer: Self-pay

## 2024-05-17 ENCOUNTER — Other Ambulatory Visit (HOSPITAL_BASED_OUTPATIENT_CLINIC_OR_DEPARTMENT_OTHER): Payer: Self-pay

## 2024-05-17 ENCOUNTER — Other Ambulatory Visit (HOSPITAL_COMMUNITY): Payer: Self-pay | Admitting: Family Medicine

## 2024-05-17 DIAGNOSIS — Z6841 Body Mass Index (BMI) 40.0 and over, adult: Secondary | ICD-10-CM | POA: Diagnosis not present

## 2024-05-17 DIAGNOSIS — M5412 Radiculopathy, cervical region: Secondary | ICD-10-CM

## 2024-05-18 ENCOUNTER — Other Ambulatory Visit (HOSPITAL_BASED_OUTPATIENT_CLINIC_OR_DEPARTMENT_OTHER): Payer: Self-pay

## 2024-05-18 MED ORDER — PREDNISONE 10 MG PO TABS
ORAL_TABLET | ORAL | 0 refills | Status: AC
  Filled 2024-05-18: qty 45, 15d supply, fill #0

## 2024-05-18 MED ORDER — METHOCARBAMOL 500 MG PO TABS
500.0000 mg | ORAL_TABLET | Freq: Four times a day (QID) | ORAL | 1 refills | Status: DC | PRN
  Filled 2024-05-18: qty 120, 30d supply, fill #0
  Filled 2024-10-09: qty 120, 30d supply, fill #1

## 2024-05-19 ENCOUNTER — Other Ambulatory Visit: Payer: Self-pay

## 2024-05-19 ENCOUNTER — Other Ambulatory Visit: Payer: Self-pay | Admitting: Pharmacist

## 2024-05-19 ENCOUNTER — Other Ambulatory Visit (HOSPITAL_BASED_OUTPATIENT_CLINIC_OR_DEPARTMENT_OTHER): Payer: Self-pay

## 2024-05-19 ENCOUNTER — Other Ambulatory Visit: Payer: Self-pay | Admitting: Cardiology

## 2024-05-19 DIAGNOSIS — E7439 Other disorders of intestinal carbohydrate absorption: Secondary | ICD-10-CM

## 2024-05-19 MED FILL — Metformin HCl Tab 500 MG: ORAL | 30 days supply | Qty: 60 | Fill #0 | Status: AC

## 2024-05-23 ENCOUNTER — Ambulatory Visit (HOSPITAL_COMMUNITY)
Admission: RE | Admit: 2024-05-23 | Discharge: 2024-05-23 | Disposition: A | Discharge status: Home / Self Care | Source: Ambulatory Visit | Attending: Family Medicine | Admitting: Family Medicine

## 2024-05-23 DIAGNOSIS — M5030 Other cervical disc degeneration, unspecified cervical region: Secondary | ICD-10-CM | POA: Diagnosis not present

## 2024-05-23 DIAGNOSIS — M4802 Spinal stenosis, cervical region: Secondary | ICD-10-CM | POA: Diagnosis not present

## 2024-05-23 DIAGNOSIS — M5412 Radiculopathy, cervical region: Secondary | ICD-10-CM | POA: Insufficient documentation

## 2024-05-23 DIAGNOSIS — M47812 Spondylosis without myelopathy or radiculopathy, cervical region: Secondary | ICD-10-CM | POA: Diagnosis not present

## 2024-05-30 ENCOUNTER — Other Ambulatory Visit (HOSPITAL_BASED_OUTPATIENT_CLINIC_OR_DEPARTMENT_OTHER): Payer: Self-pay

## 2024-05-30 DIAGNOSIS — Z6839 Body mass index (BMI) 39.0-39.9, adult: Secondary | ICD-10-CM | POA: Diagnosis not present

## 2024-05-30 DIAGNOSIS — M5412 Radiculopathy, cervical region: Secondary | ICD-10-CM | POA: Diagnosis not present

## 2024-05-30 MED ORDER — GABAPENTIN 100 MG PO CAPS
100.0000 mg | ORAL_CAPSULE | Freq: Three times a day (TID) | ORAL | 2 refills | Status: DC
  Filled 2024-05-30: qty 90, 30d supply, fill #0
  Filled 2024-07-10: qty 90, 30d supply, fill #1
  Filled 2024-09-14: qty 90, 30d supply, fill #2

## 2024-06-06 DIAGNOSIS — G4733 Obstructive sleep apnea (adult) (pediatric): Secondary | ICD-10-CM | POA: Diagnosis not present

## 2024-06-11 LAB — HEMOGLOBIN A1C: Hemoglobin A1C: 5.9

## 2024-06-11 LAB — GLUCOSE, POCT (MANUAL RESULT ENTRY): Glucose Fasting, POC: 111 mg/dL — AB (ref 70–99)

## 2024-06-23 ENCOUNTER — Other Ambulatory Visit (HOSPITAL_BASED_OUTPATIENT_CLINIC_OR_DEPARTMENT_OTHER): Payer: Self-pay

## 2024-06-23 MED FILL — Metformin HCl Tab 500 MG: ORAL | 30 days supply | Qty: 60 | Fill #1 | Status: AC

## 2024-06-24 ENCOUNTER — Other Ambulatory Visit (HOSPITAL_BASED_OUTPATIENT_CLINIC_OR_DEPARTMENT_OTHER): Payer: Self-pay

## 2024-07-22 DIAGNOSIS — M5412 Radiculopathy, cervical region: Secondary | ICD-10-CM | POA: Diagnosis not present

## 2024-07-22 DIAGNOSIS — Z6839 Body mass index (BMI) 39.0-39.9, adult: Secondary | ICD-10-CM | POA: Diagnosis not present

## 2024-07-27 ENCOUNTER — Ambulatory Visit: Attending: Neurosurgery | Admitting: Rehabilitative and Restorative Service Providers"

## 2024-07-27 ENCOUNTER — Other Ambulatory Visit: Payer: Self-pay

## 2024-07-27 ENCOUNTER — Encounter: Payer: Self-pay | Admitting: Rehabilitative and Restorative Service Providers"

## 2024-07-27 DIAGNOSIS — R252 Cramp and spasm: Secondary | ICD-10-CM | POA: Diagnosis present

## 2024-07-27 DIAGNOSIS — M5412 Radiculopathy, cervical region: Secondary | ICD-10-CM | POA: Diagnosis present

## 2024-07-27 DIAGNOSIS — M6281 Muscle weakness (generalized): Secondary | ICD-10-CM | POA: Insufficient documentation

## 2024-07-27 DIAGNOSIS — R293 Abnormal posture: Secondary | ICD-10-CM | POA: Diagnosis present

## 2024-07-27 DIAGNOSIS — M542 Cervicalgia: Secondary | ICD-10-CM | POA: Diagnosis present

## 2024-07-27 NOTE — Therapy (Addendum)
 OUTPATIENT PHYSICAL THERAPY CERVICAL EVALUATION   Patient Name: Terry Kim MRN: [id number] DOB:10/02/1961, 63 y.o., male Today's Date: 07/27/2024  END OF SESSION:  PT End of Session - 07/27/24 1308     Visit Number 1    Date for PT Re-Evaluation 09/16/24    Authorization Type [facility]    PT Start Time 0930    PT Stop Time 1015    PT Time Calculation (min) 45 min    Activity Tolerance Patient tolerated treatment well    Behavior During Therapy [facility] for tasks assessed/performed          Past Medical History:  Diagnosis Date   Hypertension    Obesity    Persistent atrial fibrillation (HCC)    CHADS2VASC score is 1   Pure hypercholesterolemia    Supraventricular premature beats    Past Surgical History:  Procedure Laterality Date   KNEE ARTHROSCOPY WITH MEDIAL MENISECTOMY Left 07/05/2014   Procedure: LEFT ARTHROSCOPY KNEE WITH MEDIAL MENISECTOMY/DEBRIDEMENT/SHAVING (CHONDROPLASTY)/ARTHROSCOPY KNEE ABRASION ARTHROPLASTY/MULTIPLE DRILLING;  Surgeon: LELON JONETTA Shari Mickey., MD;  Location: [facility];  Service: Orthopedics;  Laterality: Left;   SHOULDER ARTHROSCOPY  2000   right   SHOULDER ARTHROSCOPY W/ ROTATOR CUFF REPAIR  2012   left   TONSILLECTOMY     Patient Active Problem List   Diagnosis Date Noted   Glucose intolerance 02/10/2023   OSA (obstructive sleep apnea) 08/30/2020   Supraventricular premature beats    Persistent atrial fibrillation (HCC)    Obesity    Pure hypercholesterolemia    Hypertension     PCP: Carlin Gull, MD   REFERRING PROVIDER: Dorn Ned, MD  REFERRING DIAG: Cervical radiculopathy on right side   THERAPY DIAG:  Cervicalgia  Radiculopathy, cervical region  Muscle weakness (generalized)  Cramp and spasm  Abnormal posture  Rationale for Evaluation and Treatment: Rehabilitation  ONSET DATE: June 24, 25  SUBJECTIVE:                                                                                                                                                                                                          SUBJECTIVE STATEMENT: Pt reports starting off with stiff neck, then June 24 woke up with numbness and tingling down right arm, started to lose function of his arm, couldn't pick up anything. And lost coordination. Went to go see doctor who referred Dr Ned , and that doctor suggested PT instead of pt getting surgery.  Hand dominance: Right  PERTINENT HISTORY:  Right shoulder arthroscopy in 2000 Left  shoulder arthroscopy with rotator cuff repair in 2012  PAIN:  Are you having pain? Yes: Pain location: Right upper trap Pain description: Throbbing (aching pain in right hand) Aggravating factors: physical work, cannot sleep on either side  Relieving factors: neuropentin, traction, uses traction unit at home, Dry needling, massages, heat  PAIN:  Are you having pain? Yes NPRS scale: 5/10 Pain location: right upper trap Pain description: throbbing (aching pain in right hand)  Aggravating factors: physical work, cannot sleep on either side Relieving factors: medication, uses traction unit at home, Dry needling, massages, heat   PRECAUTIONS: None  RED FLAGS: None     WEIGHT BEARING RESTRICTIONS: No  FALLS:  Has patient fallen in last 6 months? 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  DIAGNOSTIC FINDINGS:  Cervical MRI on  05/23/2024: IMPRESSION: 1. Cervical spondylosis and degenerative disc disease causing prominent foraminal stenosis at C3-4, C4-5, C5-6, and C6-7. Moderate right foraminal stenosis at C2-3.  PATIENT SURVEYS:  UEFS  Extreme difficulty/unable (0), Quite a bit of difficulty (1), Moderate difficulty (2), Little difficulty (3), No difficulty (4) Survey date:  07/27/24  Any of your usual work, household or school activities 2  2. Your usual hobbies, recreational/sport activities 2   3. Lifting a bag of groceries to waist level 3   4. Lifting a bag of groceries above your head 1  5. Grooming your hair 3  6. Pushing up on your hands (I.e. from bathtub or chair) 2  7. Preparing food (I.e. peeling/cutting) 3  8. Driving  3  9. Vacuuming, sweeping, or raking 3  10. Dressing  4  11. Doing up buttons 3  12. Using tools/appliances 3  13. Opening doors 3  14. Cleaning  3  15. Tying or lacing shoes 3  16. Sleeping  2  17. Laundering clothes (I.e. washing, ironing, folding) 2  18. Opening a jar 2  19. Throwing a ball 3  20. Carrying a small suitcase with your affected limb.  2  Score total:  52/80 = 65%            COGNITION: Overall cognitive status: Within functional limits for tasks assessed  SENSATION: Not tested  POSTURE: rounded shoulders and forward head  PALPATION: TTP right upper trap    CERVICAL ROM:   Active ROM A/ROM (deg) eval  Flexion 30 deg   Extension 20 deg  Right lateral flexion 22 deg   Left lateral flexion 40 deg   Right rotation 58 deg   Left rotation 55 deg    (Blank rows = not tested)  UPPER EXTREMITY ROM:  Active ROM Right eval Left eval  Shoulder flexion 140 170  Shoulder extension    Shoulder abduction 140 180  Shoulder adduction    Shoulder extension    Shoulder internal rotation 8 55  Shoulder external rotation 60 80  Elbow flexion    Elbow extension    Wrist  flexion    Wrist extension    Wrist ulnar deviation    Wrist radial deviation     Wrist pronation    Wrist supination     (Blank rows = not tested)  UPPER EXTREMITY MMT:  MMT Right eval Left eval  Shoulder flexion 3+ 4+  Shoulder extension 4+ 4+  Shoulder abduction 4 4+  Shoulder adduction    Shoulder extension 4+ 4+  Shoulder internal rotation 4 4  Shoulder external rotation 4- 4+  Middle trapezius    Lower trapezius    Elbow flexion    Elbow extension    Wrist flexion    Wrist extension    Wrist ulnar deviation    Wrist radial deviation    Wrist pronation    Wrist supination    Grip strength     (Blank rows = not tested)  CERVICAL SPECIAL TESTS:  Upper limb tension test (ULTT): Positive, Spurling's test: Positive, and Distraction test: Positive Bakody's Sign: positive Phalen's: positive Reverse phalen's: negative    FUNCTIONAL TESTS:  Eval: 5 times sit to stand: 16.40 sec   TREATMENT DATE:  07/27/24   Reviewed HEP and discussed role of PT Moist heat pack to right upper trap x10 min  PATIENT EDUCATION:   Person educated: Patient Education method: Explanation, Demonstration, Tactile cues, Verbal cues, and Handouts Education comprehension: verbalized understanding, returned demonstration, verbal cues required, tactile cues required, and needs further education  HOME EXERCISE PROGRAM: Access Code: [facility] URL: [email or web address] Date: 07/27/2024 Prepared by: Jarrell Laming  Exercises - Isometric Shoulder Flexion at Wall  - 1 x daily - 7 x weekly - 2 sets - 10 reps - Isometric Shoulder Extension at Wall  - 1 x daily - 7 x weekly - 2 sets - 10 reps - Isometric Shoulder Abduction at Wall  - 1 x daily - 7 x weekly - 2 sets - 10 reps - Standing Isometric Shoulder Internal Rotation at Doorway  - 1 x daily - 7 x weekly - 2 sets - 10 reps - Standing Isometric Shoulder External Rotation with Doorway  - 1 x daily - 7 x weekly - 2 sets - 10 reps - Seated Scapular Retraction  - 1 x daily - 7 x weekly - 2 sets - 10  reps  ASSESSMENT:  CLINICAL IMPRESSION:  Patient is a 63 y.o. male who was seen today for physical therapy evaluation and treatment for cervical radiculopathy, onset date May 10, 2024. 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 Pt demonstrates several positive special tests including Bakody's sign, Spurlings, Distraction, ULTT on Median N, and Phalen's. Pt presented with mild pain during cervical ROM testing that was significantly limited, however had severe referred pain to back causing muscle spasms during  Median N ULTT; given heat pack at end of session. Pt would like to be able to lift an IV bag for his patients without pain or difficulty, return to gardening, and hiking. Pt will benefit from continuing skilled therapy to address the deficits below and return to PLOF.   OBJECTIVE IMPAIRMENTS: decreased activity tolerance, decreased balance, decreased coordination, decreased endurance, decreased mobility, decreased ROM, decreased strength, hypomobility, increased fascial restrictions, increased muscle spasms, impaired flexibility, impaired sensation, impaired UE functional use, improper body mechanics, postural dysfunction, and pain.   ACTIVITY LIMITATIONS: carrying, lifting, bending, sitting, standing, sleeping, transfers, bathing, dressing, and reach over head  PARTICIPATION LIMITATIONS: meal prep, cleaning,  driving, community activity, and occupation  PERSONAL FACTORS: Age, Behavior pattern, Education, Past/current experiences, Profession, and 3+ comorbidities: Hx of bilateral shoulder arthroscopy, knee arthroscopy, A-Fib are also affecting patient's functional outcome.   REHAB POTENTIAL: Good  CLINICAL DECISION MAKING: Evolving/moderate complexity  EVALUATION COMPLEXITY: Moderate   GOALS: Goals reviewed with patient? Yes  SHORT TERM GOALS: Target date: 08/26/24  Pt will demonstrate competency with  initial HEP so he is able to complete household tasks independently Baseline:  Goal status: INITIAL  2.  Pt will demonstrate self care strategies at home so he can improve independent habits Baseline:  Goal status: INITIAL     LONG TERM GOALS: Target date: 09/16/2024  Pt will demonstrate competency with advanced HEP to allow for self progression after discharge. Baseline:  Goal status: INITIAL  2.  Pt will increase cervical ROM to [facility] to allow him to flex neck to read charts at work Baseline: 30 deg Goal status: INITIAL  3.  Pt will increase shoulder strength to 4+ to 5/5 bilaterally  so he can carry his work bag using the right arm Baseline:  Goal status: INITIAL  4.  Pt will to report decreased cervical pain radiating down his right arm during functional tasks, such as reaching overhead to place IV back. Baseline:  Goal status: INITIAL  5.  Pt will increase UEFS score to at least 75% so he is able to return to gardening fruit trees Baseline: 65% Goal status: INITIAL  6.  Pt will decrease Sit to stand time to 12 sec or less so he is increases functional capacity for going hiking Baseline: 16 sec  Goal status: INITIAL   PLAN:  PT FREQUENCY: 1-2x/week  PT DURATION: 8 weeks  PLANNED INTERVENTIONS: [id number]- PT Re-evaluation, 97750- Physical Performance Testing, 97110-Therapeutic exercises, 97530- Therapeutic activity, [id number]- Neuromuscular re-education, 97535- Self Care, [id number]- Manual therapy, [id number]- Gait training, [phone]- Orthotic Initial, [phone]- Canalith repositioning, [id number]- Aquatic Therapy, H9716- Electrical stimulation (unattended), [id number]- Electrical stimulation (manual), [id number]- Vasopneumatic device, [id number]- Ultrasound, [id number]- Traction (mechanical), [id number]- Parrafin, [id number] (1-2 muscles), [id number] (3+ muscles)- Dry Needling, Patient/Family education, Balance training, Stair training, Taping, Joint mobilization, Joint manipulation, Spinal manipulation, Spinal mobilization,  Vestibular training, Cryotherapy, and Moist heat  PLAN FOR NEXT SESSION: cervical stretches, neck flexibility, shoulder mobility, nerve glides, isometrics, theraband, traction,    Lavanda Cleverly, SPT 07/27/24, 1:18 PM  I agree with the following treatment note after reviewing documentation. This session was performed under the supervision of a licensed clinician. Jarrell Menke, PT, DPT 07/27/24, 1:18 PM

## 2024-08-03 ENCOUNTER — Ambulatory Visit

## 2024-08-03 DIAGNOSIS — M542 Cervicalgia: Secondary | ICD-10-CM

## 2024-08-03 DIAGNOSIS — M6281 Muscle weakness (generalized): Secondary | ICD-10-CM

## 2024-08-03 DIAGNOSIS — M5412 Radiculopathy, cervical region: Secondary | ICD-10-CM

## 2024-08-03 DIAGNOSIS — R293 Abnormal posture: Secondary | ICD-10-CM

## 2024-08-03 DIAGNOSIS — R252 Cramp and spasm: Secondary | ICD-10-CM

## 2024-08-03 NOTE — Therapy (Signed)
 OUTPATIENT PHYSICAL THERAPY CERVICAL TREATMENT   Patient Name: Terry Kim MRN: [id number] DOB:06-26-1961, 63 y.o., male Today's Date: 08/04/2024  END OF SESSION:  PT End of Session - 08/04/24 0936     Visit Number 3    Date for Recertification  09/16/24    Authorization Type [facility]    PT Start Time [phone]    PT Stop Time 1015    PT Time Calculation (min) 41 min    Activity Tolerance Patient tolerated treatment well    Behavior During Therapy [facility] for tasks assessed/performed           Past Medical History:  Diagnosis Date   Hypertension    Obesity    Persistent atrial fibrillation (HCC)    CHADS2VASC score is 1   Pure hypercholesterolemia    Supraventricular premature beats    Past Surgical History:  Procedure Laterality Date   KNEE ARTHROSCOPY WITH MEDIAL MENISECTOMY Left 07/05/2014   Procedure: LEFT ARTHROSCOPY KNEE WITH MEDIAL MENISECTOMY/DEBRIDEMENT/SHAVING (CHONDROPLASTY)/ARTHROSCOPY KNEE ABRASION ARTHROPLASTY/MULTIPLE DRILLING;  Surgeon: LELON JONETTA Shari Mickey., MD;  Location: [facility];  Service: Orthopedics;  Laterality: Left;   SHOULDER ARTHROSCOPY  2000   right   SHOULDER ARTHROSCOPY W/ ROTATOR CUFF REPAIR  2012   left   TONSILLECTOMY     Patient Active Problem List   Diagnosis Date Noted   Glucose intolerance 02/10/2023   OSA (obstructive sleep apnea) 08/30/2020   Supraventricular premature beats    Persistent atrial fibrillation (HCC)    Obesity    Pure hypercholesterolemia    Hypertension     PCP: Carlin Gull, MD   REFERRING PROVIDER: Dorn Ned, MD  REFERRING DIAG: Cervical radiculopathy on right side   THERAPY DIAG:  Cervicalgia  Radiculopathy, cervical region  Muscle weakness (generalized)  Cramp and spasm  Abnormal posture  Rationale for Evaluation and Treatment: Rehabilitation  ONSET DATE: June 24, 25  SUBJECTIVE:                                                                                                                                                                                                          SUBJECTIVE STATEMENT: The tingling/numbness is still present. . It just comes and goes. No neck pain currently.   PERTINENT HISTORY:  Right shoulder arthroscopy in 2000 Left shoulder arthroscopy with rotator cuff repair in 2012  PAIN:  Are you having pain? Yes: Pain location: Right upper trap Pain description: Throbbing (aching pain in right hand) Aggravating factors: physical work, cannot sleep on either side  Relieving  factors: neuropentin, traction, uses traction unit at home, Dry needling, massages, heat  PAIN:  Are you having pain? Yes NPRS scale: 0/10 currently Pain location: right upper trap Pain description: throbbing (aching pain in right hand)  Aggravating factors: physical work, cannot sleep on either side Relieving factors: medication, uses traction unit at home, Dry needling, massages, heat   PRECAUTIONS: None  RED FLAGS: None     WEIGHT BEARING RESTRICTIONS: No  FALLS:  Has patient fallen in last 6 months? 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  DIAGNOSTIC FINDINGS:  Cervical MRI on 05/23/2024: IMPRESSION: 1. Cervical spondylosis and degenerative disc disease causing prominent foraminal stenosis at C3-4, C4-5, C5-6, and C6-7. Moderate right foraminal stenosis at C2-3.  PATIENT SURVEYS:  UEFS  Extreme  difficulty/unable (0), Quite a bit of difficulty (1), Moderate difficulty (2), Little difficulty (3), No difficulty (4) Survey date:  07/27/24  Any of your usual work, household or school activities 2  2. Your usual hobbies, recreational/sport activities 2   3. Lifting a bag of groceries to waist level 3   4. Lifting a bag of groceries above your head 1  5. Grooming your hair 3  6. Pushing up on your hands (I.e. from bathtub or chair) 2  7. Preparing food (I.e. peeling/cutting) 3  8. Driving  3  9. Vacuuming, sweeping, or raking 3  10. Dressing  4  11. Doing up buttons 3  12. Using tools/appliances 3  13. Opening doors 3  14. Cleaning  3  15. Tying or lacing shoes 3  16. Sleeping  2  17. Laundering clothes (I.e. washing, ironing, folding) 2  18. Opening a jar 2  19. Throwing a ball 3  20. Carrying a small suitcase with your affected limb.  2  Score total:  52/80 = 65%            COGNITION: Overall cognitive status: Within functional limits for tasks assessed  SENSATION: Not tested  POSTURE: rounded shoulders and forward head  PALPATION: TTP right upper trap    CERVICAL ROM:   Active ROM A/ROM (deg) eval  Flexion 30 deg   Extension 20 deg  Right lateral flexion 22 deg   Left lateral flexion 40 deg   Right rotation 58 deg   Left rotation 55 deg    (Blank rows = not tested)  UPPER EXTREMITY ROM:  Active ROM Right eval Left eval  Shoulder flexion 140 170  Shoulder extension    Shoulder abduction 140 180  Shoulder adduction    Shoulder extension    Shoulder internal rotation 8 55  Shoulder external rotation 60 80  Elbow flexion    Elbow extension    Wrist flexion    Wrist extension    Wrist ulnar deviation    Wrist radial deviation    Wrist pronation    Wrist supination     (Blank rows = not tested)  UPPER  EXTREMITY MMT:  MMT Right eval Left eval  Shoulder flexion 3+ 4+  Shoulder extension 4+ 4+  Shoulder abduction 4 4+  Shoulder adduction     Shoulder extension 4+ 4+  Shoulder internal rotation 4 4  Shoulder external rotation 4- 4+  Middle trapezius    Lower trapezius    Elbow flexion    Elbow extension    Wrist flexion    Wrist extension    Wrist ulnar deviation    Wrist radial deviation    Wrist pronation    Wrist supination    Grip strength     (Blank rows = not tested)  CERVICAL SPECIAL TESTS:  Upper limb tension test (ULTT): Positive, Spurling's test: Positive, and Distraction test: Positive Bakody's Sign: positive Phalen's: positive Reverse phalen's: negative    FUNCTIONAL TESTS:  Eval: 5 times sit to stand: 16.40 sec   TREATMENT DATE:  08/04/24 UBE L1 x 4 min alternating fwd/bwd  UT and levator stretches 2x 30 sec B Assessed upper limb tension Radial nerve glide in standing 5 sec hold x 10 Standing (advised to stay in supine for home) scapular series with red theraband with VC's to hold chin tuck as able throughout: Horz abd, bil er, narrow and wide grip flexion (Rt 8 reps due to hand fatigue from holding band)  Cervical Traction Intermittent 60 on/10 off, 25 lbs max/10 lbs min x 12 mins, to help decrease cervical tightness and decrease Lt UE radiculopathy     08/03/24: Therapeutic Activities Supine over full foam roll for following: Bil horz abd, bil UE scaption in a V x 10 each, bil UE abd in a snow angel x 10 with 3-5 sec holds. Encouraged pt to hold chin tuck as able throughout stretches Supine scapular series with yellow theraband with VC's to hold chin tuck as able throughout: Horz abd, bil er, narrow and wide grip flexion (Rt 8 reps due to hand fatigue from holding band), and then D2 x 10 each; added to HEP Manual Therapy STM with cocoa butter to Rt>Lt upper traps, Rt SCM (focusing on insertion where pt palpably very tight), and suboccipital release P/ROM into cervical Lt side bending and rotation with overpressure to Rt clavicle for increased stretch Cervical Traction Intermittent 60  on/10 off, 20 lbs max/5 lbs min x 12 mins, to help decrease cervical tightness and decrease Lt UE radiculopathy   07/27/24   Reviewed HEP and discussed role of PT Moist heat pack to right upper trap x10 min  PATIENT EDUCATION:   Person educated: Patient Education method: Explanation, Demonstration, Tactile cues, Verbal cues, and Handouts Education comprehension: verbalized understanding, returned demonstration, verbal cues required, tactile cues required, and needs further education  HOME EXERCISE PROGRAM: Access Code: [facility] URL: [email or web address] Date: 08/04/2024 Prepared by: Mliss  Exercises - Isometric Shoulder Flexion at Wall  - 1 x daily - 7 x weekly - 2 sets - 10 reps - Isometric Shoulder Extension at Wall  - 1 x daily - 7 x weekly - 2 sets - 10 reps - Isometric Shoulder Abduction at Wall  - 1 x daily - 7 x weekly - 2 sets - 10 reps - Standing Isometric Shoulder Internal Rotation at Doorway  - 1 x daily - 7 x weekly - 2 sets - 10 reps - Standing Isometric Shoulder External Rotation with Doorway  - 1 x daily - 7 x weekly - 2 sets - 10 reps - Seated Scapular Retraction  - 1 x daily - 7 x  weekly - 2 sets - 10 reps - Standing Radial Nerve Glide (Mirrored)  - 2 x daily - 7 x weekly - 1 sets - 10 reps - 5 sec on/5 sec off hold - Median Nerve Flossing (Mirrored)  - 1 x daily - 3 x weekly - 2 sets - 10 reps - Seated Cervical Sidebending Stretch  - 2 x daily - 7 x weekly - 1 sets - 3 reps - 30 sec hold - Seated Levator Scapulae Stretch  - 2 x daily - 7 x weekly - 1 sets - 3 reps - 30 sec hold  ASSESSMENT:  CLINICAL IMPRESSION: Patient demonstrates tension in his radial and median nerves, so nerve floss/glides added to HEP. Attempted scapular series in standing today, but form is not as good as in supine. Advised pt to stay in supine for now. Increased traction today by 5# with good response after. He reports some improvement in numbness. He would benefit from R  single arm ER strengthening as L arm dominates with B.  OBJECTIVE IMPAIRMENTS: decreased activity tolerance, decreased balance, decreased coordination, decreased endurance, decreased mobility, decreased ROM, decreased strength, hypomobility, increased fascial restrictions, increased muscle spasms, impaired flexibility, impaired sensation, impaired UE functional use, improper body mechanics, postural dysfunction, and pain.   ACTIVITY LIMITATIONS: carrying, lifting, bending, sitting, standing, sleeping, transfers, bathing, dressing, and reach over head  PARTICIPATION LIMITATIONS: meal prep, cleaning, driving, community activity, and occupation  PERSONAL FACTORS: Age, Behavior pattern, Education, Past/current experiences, Profession, and 3+ comorbidities: Hx of bilateral shoulder arthroscopy, knee arthroscopy, A-Fib are also affecting patient's functional outcome.   REHAB POTENTIAL: Good  CLINICAL DECISION MAKING: Evolving/moderate complexity  EVALUATION COMPLEXITY: Moderate   GOALS: Goals reviewed with patient? Yes  SHORT TERM GOALS: Target date: 08/26/24  Pt will demonstrate competency with initial HEP so he is able to complete household tasks independently Baseline:  Goal status: INITIAL  2.  Pt will demonstrate self care strategies at home so he can improve independent habits Baseline:  Goal status: INITIAL     LONG TERM GOALS: Target date: 09/16/2024  Pt will demonstrate competency with advanced HEP to allow for self progression after discharge. Baseline:  Goal status: INITIAL  2.  Pt will increase cervical ROM to [facility] to allow him to flex neck to read charts at work Baseline: 30 deg Goal status: INITIAL  3.  Pt will increase shoulder strength to 4+ to 5/5 bilaterally  so he can carry his work bag using the right arm Baseline:  Goal status: INITIAL  4.  Pt will to report decreased cervical pain radiating down his right arm during functional tasks, such as reaching  overhead to place IV back. Baseline:  Goal status: INITIAL  5.  Pt will increase UEFS score to at least 75% so he is able to return to gardening fruit trees Baseline: 65% Goal status: INITIAL  6.  Pt will decrease Sit to stand time to 12 sec or less so he is increases functional capacity for going hiking Baseline: 16 sec  Goal status: INITIAL   PLAN:  PT FREQUENCY: 1-2x/week  PT DURATION: 8 weeks  PLANNED INTERVENTIONS: [id number]- PT Re-evaluation, 97750- Physical Performance Testing, 97110-Therapeutic exercises, 97530- Therapeutic activity, [id number]- Neuromuscular re-education, 97535- Self Care, [id number]- Manual therapy, [id number]- Gait training, [id number]- Orthotic Initial, [phone]- Canalith repositioning, [id number]- Aquatic Therapy, [id number]- Electrical stimulation (unattended), [id number]- Electrical stimulation (manual), [id number]- Vasopneumatic device, [id number]- Ultrasound, [id number]- Traction (mechanical), [id number]- Parrafin, [id number] (1-2 muscles), [id number] (3+ muscles)- Dry Needling, Patient/Family education, Balance  training, Stair training, Taping, Joint mobilization, Joint manipulation, Spinal manipulation, Spinal mobilization, Vestibular training, Cryotherapy, and Moist heat  PLAN FOR NEXT SESSION: Cont cervical stretches and neck flexibility, shoulder mobility, nerve glides, isometrics, review supine scap series with theraband, and cont traction   Mliss Cummins, PT  08/04/24 10:20 AM

## 2024-08-03 NOTE — Therapy (Signed)
 OUTPATIENT PHYSICAL THERAPY CERVICAL TREATMENT   Patient Name: Terry Kim MRN: [id number] DOB:17-Nov-1961, 63 y.o., male Today's Date: 08/03/2024  END OF SESSION:  PT End of Session - 08/03/24 1004     Visit Number 2    Date for PT Re-Evaluation 09/16/24    Authorization Type [facility]    PT Start Time 1001    PT Stop Time 1057    PT Time Calculation (min) 56 min    Activity Tolerance Patient tolerated treatment well    Behavior During Therapy WFL for tasks assessed/performed          Past Medical History:  Diagnosis Date   Hypertension    Obesity    Persistent atrial fibrillation (HCC)    CHADS2VASC score is 1   Pure hypercholesterolemia    Supraventricular premature beats    Past Surgical History:  Procedure Laterality Date   KNEE ARTHROSCOPY WITH MEDIAL MENISECTOMY Left 07/05/2014   Procedure: LEFT ARTHROSCOPY KNEE WITH MEDIAL MENISECTOMY/DEBRIDEMENT/SHAVING (CHONDROPLASTY)/ARTHROSCOPY KNEE ABRASION ARTHROPLASTY/MULTIPLE DRILLING;  Surgeon: LELON JONETTA Shari Mickey., MD;  Location: [facility];  Service: Orthopedics;  Laterality: Left;   SHOULDER ARTHROSCOPY  2000   right   SHOULDER ARTHROSCOPY W/ ROTATOR CUFF REPAIR  2012   left   TONSILLECTOMY     Patient Active Problem List   Diagnosis Date Noted   Glucose intolerance 02/10/2023   OSA (obstructive sleep apnea) 08/30/2020   Supraventricular premature beats    Persistent atrial fibrillation (HCC)    Obesity    Pure hypercholesterolemia    Hypertension     PCP: Carlin Gull, MD   REFERRING PROVIDER: Dorn Ned, MD  REFERRING DIAG: Cervical radiculopathy on right side   THERAPY DIAG:  Cervicalgia  Radiculopathy, cervical region  Muscle weakness (generalized)  Cramp and spasm  Abnormal posture  Rationale for Evaluation and Treatment: Rehabilitation  ONSET DATE: June 24, 25  SUBJECTIVE:                                                                                                                                                                                                          SUBJECTIVE STATEMENT: The exercises are going fine. The tingling/numbness is still present. It just comes and goes. No neck pain currently.   PERTINENT HISTORY:  Right shoulder arthroscopy in 2000 Left shoulder arthroscopy with rotator cuff repair in 2012  PAIN:  Are you having pain? Yes: Pain location: Right upper trap Pain description: Throbbing (aching pain in right hand) Aggravating factors: physical work, cannot sleep on either  side  Relieving factors: neuropentin, traction, uses traction unit at home, Dry needling, massages, heat  PAIN:  Are you having pain? Yes NPRS scale: 0/10 currently Pain location: right upper trap Pain description: throbbing (aching pain in right hand)  Aggravating factors: physical work, cannot sleep on either side Relieving factors: medication, uses traction unit at home, Dry needling, massages, heat   PRECAUTIONS: None  RED FLAGS: None     WEIGHT BEARING RESTRICTIONS: No  FALLS:  Has patient fallen in last 6 months? 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  DIAGNOSTIC FINDINGS:  Cervical MRI on 05/23/2024: IMPRESSION: 1. Cervical spondylosis and degenerative disc disease causing prominent foraminal stenosis at C3-4, C4-5, C5-6, and C6-7. Moderate right foraminal stenosis at C2-3.  PATIENT  SURVEYS:  UEFS  Extreme difficulty/unable (0), Quite a bit of difficulty (1), Moderate difficulty (2), Little difficulty (3), No difficulty (4) Survey date:  07/27/24  Any of your usual work, household or school activities 2  2. Your usual hobbies, recreational/sport activities 2   3. Lifting a bag of groceries to waist level 3   4. Lifting a bag of groceries above your head 1  5. Grooming your hair 3  6. Pushing up on your hands (I.e. from bathtub or chair) 2  7. Preparing food (I.e. peeling/cutting) 3  8. Driving  3  9. Vacuuming, sweeping, or raking 3  10. Dressing  4  11. Doing up buttons 3  12. Using tools/appliances 3  13. Opening doors 3  14. Cleaning  3  15. Tying or lacing shoes 3  16. Sleeping  2  17. Laundering clothes (I.e. washing, ironing, folding) 2  18. Opening a jar 2  19. Throwing a ball 3  20. Carrying a small suitcase with your affected limb.  2  Score total:  52/80 = 65%            COGNITION: Overall cognitive status: Within functional limits for tasks assessed  SENSATION: Not tested  POSTURE: rounded shoulders and forward head  PALPATION: TTP right upper trap    CERVICAL ROM:   Active ROM A/ROM (deg) eval  Flexion 30 deg   Extension 20 deg  Right lateral flexion 22 deg   Left lateral flexion 40 deg   Right rotation 58 deg   Left rotation 55 deg    (Blank rows = not tested)  UPPER EXTREMITY ROM:  Active ROM Right eval Left eval  Shoulder flexion 140 170  Shoulder extension    Shoulder abduction 140 180  Shoulder adduction    Shoulder extension    Shoulder internal rotation 8 55  Shoulder external rotation 60 80  Elbow flexion    Elbow extension    Wrist flexion    Wrist extension    Wrist ulnar deviation    Wrist radial deviation    Wrist pronation    Wrist supination     (Blank rows = not  tested)  UPPER EXTREMITY MMT:  MMT Right eval Left eval  Shoulder flexion 3+ 4+  Shoulder extension 4+ 4+  Shoulder abduction 4 4+   Shoulder adduction    Shoulder extension 4+ 4+  Shoulder internal rotation 4 4  Shoulder external rotation 4- 4+  Middle trapezius    Lower trapezius    Elbow flexion    Elbow extension    Wrist flexion    Wrist extension    Wrist ulnar deviation    Wrist radial deviation    Wrist pronation    Wrist supination    Grip strength     (Blank rows = not tested)  CERVICAL SPECIAL TESTS:  Upper limb tension test (ULTT): Positive, Spurling's test: Positive, and Distraction test: Positive Bakody's Sign: positive Phalen's: positive Reverse phalen's: negative    FUNCTIONAL TESTS:  Eval: 5 times sit to stand: 16.40 sec   TREATMENT DATE:  08/03/24: Therapeutic Activities Supine over full foam roll for following: Bil horz abd, bil UE scaption in a V x 10 each, bil UE abd in a snow angel x 10 with 3-5 sec holds. Encouraged pt to hold chin tuck as able throughout stretches Supine scapular series with yellow theraband with VC's to hold chin tuck as able throughout: Horz abd, bil er, narrow and wide grip flexion (Rt 8 reps due to hand fatigue from holding band), and then D2 x 10 each; added to HEP Manual Therapy STM with cocoa butter to Rt>Lt upper traps, Rt SCM (focusing on insertion where pt palpably very tight), and suboccipital release P/ROM into cervical Lt side bending and rotation with overpressure to Rt clavicle for increased stretch Cervical Traction Intermittent 60 on/10 off, 20 lbs max/5 lbs min x 12 mins, to help decrease cervical tightness and decrease Lt UE radiculopathy   07/27/24   Reviewed HEP and discussed role of PT Moist heat pack to right upper trap x10 min  PATIENT EDUCATION:   Person educated: Patient Education method: Explanation, Demonstration, Tactile cues, Verbal cues, and Handouts Education comprehension: verbalized understanding, returned demonstration, verbal cues required, tactile cues required, and needs further education  HOME EXERCISE  PROGRAM: Access Code: [facility] URL: [email or web address] Date: 07/27/2024 Prepared by: Jarrell Laming  Exercises - Isometric Shoulder Flexion at Wall  - 1 x daily - 7 x weekly - 2 sets - 10 reps - Isometric Shoulder Extension at Wall  - 1 x daily - 7 x weekly - 2 sets - 10 reps - Isometric Shoulder Abduction at Wall  - 1 x daily - 7 x weekly - 2 sets - 10 reps - Standing Isometric Shoulder Internal Rotation at Doorway  - 1 x daily - 7 x weekly - 2 sets - 10 reps - Standing Isometric Shoulder External Rotation with Doorway  - 1 x daily - 7 x weekly - 2 sets - 10 reps - Seated Scapular Retraction  - 1 x daily - 7 x weekly - 2 sets - 10 reps  ASSESSMENT:  CLINICAL IMPRESSION: Briefly reviewed isometrics with pt answering his questions about correct technique. Then progressed pt to include postural flexibility on foam roll, and then postural strength with supine scapular series. Manual therapy to decrease muscular tightness limiting end cervical A/ROM due from pain, and then cervical mechanical traction x 12 mins to help decrease radiculopathy symptoms in Rt UE.  Pt reports decreased numbness in thumb towards end of session.   OBJECTIVE IMPAIRMENTS: decreased activity tolerance, decreased balance, decreased coordination, decreased endurance, decreased mobility, decreased  ROM, decreased strength, hypomobility, increased fascial restrictions, increased muscle spasms, impaired flexibility, impaired sensation, impaired UE functional use, improper body mechanics, postural dysfunction, and pain.   ACTIVITY LIMITATIONS: carrying, lifting, bending, sitting, standing, sleeping, transfers, bathing, dressing, and reach over head  PARTICIPATION LIMITATIONS: meal prep, cleaning, driving, community activity, and occupation  PERSONAL FACTORS: Age, Behavior pattern, Education, Past/current experiences, Profession, and 3+ comorbidities: Hx of bilateral shoulder arthroscopy, knee arthroscopy, A-Fib  are also affecting patient's functional outcome.   REHAB POTENTIAL: Good  CLINICAL DECISION MAKING: Evolving/moderate complexity  EVALUATION COMPLEXITY: Moderate   GOALS: Goals reviewed with patient? Yes  SHORT TERM GOALS: Target date: 08/26/24  Pt will demonstrate competency with initial HEP so he is able to complete household tasks independently Baseline:  Goal status: INITIAL  2.  Pt will demonstrate self care strategies at home so he can improve independent habits Baseline:  Goal status: INITIAL     LONG TERM GOALS: Target date: 09/16/2024  Pt will demonstrate competency with advanced HEP to allow for self progression after discharge. Baseline:  Goal status: INITIAL  2.  Pt will increase cervical ROM to [facility] to allow him to flex neck to read charts at work Baseline: 30 deg Goal status: INITIAL  3.  Pt will increase shoulder strength to 4+ to 5/5 bilaterally  so he can carry his work bag using the right arm Baseline:  Goal status: INITIAL  4.  Pt will to report decreased cervical pain radiating down his right arm during functional tasks, such as reaching overhead to place IV back. Baseline:  Goal status: INITIAL  5.  Pt will increase UEFS score to at least 75% so he is able to return to gardening fruit trees Baseline: 65% Goal status: INITIAL  6.  Pt will decrease Sit to stand time to 12 sec or less so he is increases functional capacity for going hiking Baseline: 16 sec  Goal status: INITIAL   PLAN:  PT FREQUENCY: 1-2x/week  PT DURATION: 8 weeks  PLANNED INTERVENTIONS: 97164- PT Re-evaluation, 97750- Physical Performance Testing, 97110-Therapeutic exercises, 97530- Therapeutic activity, [id number]- Neuromuscular re-education, 97535- Self Care, [id number]- Manual therapy, [id number]- Gait training, [phone]- Orthotic Initial, [phone]- Canalith repositioning, [id number]- Aquatic Therapy, [id number]- Electrical stimulation (unattended), [id number]- Electrical stimulation (manual), [id number]-  Vasopneumatic device, [id number]- Ultrasound, [id number]- Traction (mechanical), [id number]- Parrafin, [id number] (1-2 muscles), [id number] (3+ muscles)- Dry Needling, Patient/Family education, Balance training, Stair training, Taping, Joint mobilization, Joint manipulation, Spinal manipulation, Spinal mobilization, Vestibular training, Cryotherapy, and Moist heat  PLAN FOR NEXT SESSION: Cont cervical stretches and neck flexibility, shoulder mobility, nerve glides, isometrics, review supine scap series with theraband, and cont traction   Berwyn Knights, PTA 08/03/24 11:04 AM

## 2024-08-03 NOTE — Patient Instructions (Addendum)
 Over Head Pull: Narrow and Wide Grip   Outpatient Rehab [phone]   On back, knees bent, feet flat, band across thighs, elbows straight but relaxed. Pull hands apart (start). Keeping elbows straight, bring arms up and over head, hands toward floor. Keep pull steady on band. Hold momentarily. Return slowly, keeping pull steady, back to start. Then do same with a wider grip on the band (past shoulder width) Repeat _5-10__ times. Band color __yellow____   Side Pull: Double Arm   On back, knees bent, feet flat. Arms perpendicular to body, shoulder level, elbows straight but relaxed. Pull arms out to sides, elbows straight. Resistance band comes across collarbones, hands toward floor. Hold momentarily. Slowly return to starting position. Repeat _5-10__ times. Band color _yellow____   Sword   On back, knees bent, feet flat, left hand on left hip, right hand above left. Pull right arm DIAGONALLY (hip to shoulder) across chest. Bring right arm along head toward floor. Hold momentarily. Slowly return to starting position. Repeat _5-10__ times. Do with left arm. Band color _yellow_____   Shoulder Rotation: Double Arm   On back, knees bent, feet flat, elbows tucked at sides, bent 90, hands palms up. Pull hands apart and down toward floor, keeping elbows near sides. Hold momentarily. Slowly return to starting position. Repeat _5-10__ times. Band color __yellow____

## 2024-08-04 ENCOUNTER — Encounter: Payer: Self-pay | Admitting: Physical Therapy

## 2024-08-04 ENCOUNTER — Ambulatory Visit: Admitting: Physical Therapy

## 2024-08-04 DIAGNOSIS — M542 Cervicalgia: Secondary | ICD-10-CM

## 2024-08-04 DIAGNOSIS — M5412 Radiculopathy, cervical region: Secondary | ICD-10-CM

## 2024-08-04 DIAGNOSIS — R252 Cramp and spasm: Secondary | ICD-10-CM

## 2024-08-04 DIAGNOSIS — M6281 Muscle weakness (generalized): Secondary | ICD-10-CM

## 2024-08-04 DIAGNOSIS — R293 Abnormal posture: Secondary | ICD-10-CM

## 2024-08-08 NOTE — Therapy (Incomplete)
 OUTPATIENT PHYSICAL THERAPY CERVICAL TREATMENT   Patient Name: Terry Kim MRN: [id number] DOB:12-21-1960, 63 y.o., male Today's Date: 08/08/2024  END OF SESSION:     Past Medical History:  Diagnosis Date   Hypertension    Obesity    Persistent atrial fibrillation (HCC)    CHADS2VASC score is 1   Pure hypercholesterolemia    Supraventricular premature beats    Past Surgical History:  Procedure Laterality Date   KNEE ARTHROSCOPY WITH MEDIAL MENISECTOMY Left 07/05/2014   Procedure: LEFT ARTHROSCOPY KNEE WITH MEDIAL MENISECTOMY/DEBRIDEMENT/SHAVING (CHONDROPLASTY)/ARTHROSCOPY KNEE ABRASION ARTHROPLASTY/MULTIPLE DRILLING;  Surgeon: LELON JONETTA Shari Mickey., MD;  Location: [facility];  Service: Orthopedics;  Laterality: Left;   SHOULDER ARTHROSCOPY  2000   right   SHOULDER ARTHROSCOPY W/ ROTATOR CUFF REPAIR  2012   left   TONSILLECTOMY     Patient Active Problem List   Diagnosis Date Noted   Glucose intolerance 02/10/2023   OSA (obstructive sleep apnea) 08/30/2020   Supraventricular premature beats    Persistent atrial fibrillation (HCC)    Obesity    Pure hypercholesterolemia    Hypertension     PCP: Carlin Gull, MD   REFERRING PROVIDER: Dorn Ned, MD  REFERRING DIAG: Cervical radiculopathy on right side   THERAPY DIAG:  No diagnosis found.  Rationale for Evaluation and Treatment: Rehabilitation  ONSET DATE: June 24, 25  SUBJECTIVE:                                                                                                                                                                                                         SUBJECTIVE STATEMENT: ***The tingling/numbness is still present. . It just comes and goes. No neck pain currently.   PERTINENT HISTORY:  Right shoulder arthroscopy in 2000 Left shoulder arthroscopy with rotator cuff repair in 2012  PAIN:  Are you having pain? Yes: Pain location: Right upper trap Pain description:  Throbbing (aching pain in right hand) Aggravating factors: physical work, cannot sleep on either side  Relieving factors: neuropentin, traction, uses traction unit at home, Dry needling, massages, heat  PAIN:  Are you having pain? Yes NPRS scale: 0/10 currently Pain location: right upper trap Pain description: throbbing (aching pain in right hand)  Aggravating factors: physical work, cannot sleep on either side Relieving factors: medication, uses traction unit at home, Dry needling, massages, heat   PRECAUTIONS: None  RED FLAGS: None     WEIGHT BEARING RESTRICTIONS: No  FALLS:  Has patient fallen in last 6 months? No  LIVING ENVIRONMENT: Lives with: lives  alone Lives in: House/apartment Stairs: Yes: Internal: 5 steps; on right going up Has following equipment at home: None  OCCUPATION: ER nurse at [employer], gardening- fruit trees , hiking, hang out with friends, music  PLOF: Independent  PATIENT GOALS: Get back to functioning in right hand, feels like he lost 75% , struggling to lift IV bag up for patients, cooking - be able to raise items up on a hook   NEXT MD VISIT: 4 months from today   OBJECTIVE:  Note: Objective measures were completed at Evaluation unless otherwise noted.  DIAGNOSTIC FINDINGS:  Cervical MRI on 05/23/2024: IMPRESSION: 1. Cervical spondylosis and degenerative disc disease causing prominent foraminal stenosis at C3-4, C4-5, C5-6, and C6-7. Moderate right foraminal stenosis at C2-3.  PATIENT SURVEYS:  UEFS  Extreme difficulty/unable (0), Quite a bit of difficulty (1), Moderate difficulty (2), Little difficulty (3), No difficulty (4) Survey date:  07/27/24  Any of your usual work, household or school activities 2  2. Your usual hobbies, recreational/sport activities 2   3. Lifting a bag of groceries to waist level 3   4. Lifting a bag of groceries above your head 1  5. Grooming your hair 3  6. Pushing up on your hands (I.e. from bathtub  or chair) 2  7. Preparing food (I.e. peeling/cutting) 3  8. Driving  3  9. Vacuuming, sweeping, or raking 3  10. Dressing  4  11. Doing up buttons 3  12. Using tools/appliances 3  13. Opening doors 3  14. Cleaning  3  15. Tying or lacing shoes 3  16. Sleeping  2  17. Laundering clothes (I.e. washing, ironing, folding) 2  18. Opening a jar 2  19. Throwing a ball 3  20. Carrying a small suitcase with your affected limb.  2  Score total:  52/80 = 65%            COGNITION: Overall cognitive status: Within functional limits for tasks assessed  SENSATION: Not tested  POSTURE: rounded shoulders and forward head  PALPATION: TTP right upper trap    CERVICAL ROM:   Active ROM A/ROM (deg) eval  Flexion 30 deg   Extension 20 deg  Right lateral flexion 22 deg   Left lateral flexion 40 deg   Right rotation 58 deg   Left rotation 55 deg    (Blank rows = not tested)  UPPER EXTREMITY ROM:  Active ROM Right eval Left eval  Shoulder flexion 140 170  Shoulder extension    Shoulder abduction 140 180  Shoulder adduction    Shoulder extension    Shoulder internal rotation 8 55  Shoulder external rotation 60 80  Elbow flexion    Elbow extension    Wrist flexion    Wrist extension    Wrist ulnar deviation    Wrist radial deviation    Wrist pronation    Wrist supination     (Blank rows = not tested)  UPPER EXTREMITY MMT:  MMT Right eval Left eval  Shoulder flexion 3+ 4+  Shoulder extension 4+ 4+  Shoulder abduction 4 4+  Shoulder adduction    Shoulder extension 4+ 4+  Shoulder internal rotation 4 4  Shoulder external rotation 4- 4+  Middle trapezius    Lower trapezius    Elbow flexion    Elbow extension    Wrist flexion    Wrist extension    Wrist ulnar deviation    Wrist radial deviation    Wrist pronation  Wrist supination    Grip strength     (Blank rows = not tested)  CERVICAL SPECIAL TESTS:  Upper limb tension test (ULTT): Positive, Spurling's  test: Positive, and Distraction test: Positive Bakody's Sign: positive Phalen's: positive Reverse phalen's: negative    FUNCTIONAL TESTS:  Eval: 5 times sit to stand: 16.40 sec   TREATMENT DATE:  08/09/24 UBE L1 x 4 min alternating fwd/bwd  UT and levator stretches 2x 30 sec B Radial nerve glide in standing 5 sec hold x 10 Supine scapular series with red theraband with VC's to hold chin tuck as able throughout: [employer] abd, bil er, narrow and wide grip flexion (Rt 8 reps due to hand fatigue from holding band) Single arm ER  Cervical Traction Intermittent 60 on/10 off, 25 lbs max/10 lbs min x 12 mins, to help decrease cervical tightness and decrease Lt UE radiculopathy   08/04/24 UBE L1 x 4 min alternating fwd/bwd  UT and levator stretches 2x 30 sec B Assessed upper limb tension Radial nerve glide in standing 5 sec hold x 10 Standing (advised to stay in supine for home) scapular series with red theraband with VC's to hold chin tuck as able throughout: Horz abd, bil er, narrow and wide grip flexion (Rt 8 reps due to hand fatigue from holding band)  Cervical Traction Intermittent 60 on/10 off, 25 lbs max/10 lbs min x 12 mins, to help decrease cervical tightness and decrease Lt UE radiculopathy     08/03/24: Therapeutic Activities Supine over full foam roll for following: Bil horz abd, bil UE scaption in a V x 10 each, bil UE abd in a snow angel x 10 with 3-5 sec holds. Encouraged pt to hold chin tuck as able throughout stretches Supine scapular series with yellow theraband with VC's to hold chin tuck as able throughout: Horz abd, bil er, narrow and wide grip flexion (Rt 8 reps due to hand fatigue from holding band), and then D2 x 10 each; added to HEP Manual Therapy STM with cocoa butter to Rt>Lt upper traps, Rt SCM (focusing on insertion where pt palpably very tight), and suboccipital release P/ROM into cervical Lt side bending and rotation with overpressure to Rt clavicle for  increased stretch Cervical Traction Intermittent 60 on/10 off, 20 lbs max/5 lbs min x 12 mins, to help decrease cervical tightness and decrease Lt UE radiculopathy   07/27/24   Reviewed HEP and discussed role of PT Moist heat pack to right upper trap x10 min  PATIENT EDUCATION:   Person educated: Patient Education method: Explanation, Demonstration, Tactile cues, Verbal cues, and Handouts Education comprehension: verbalized understanding, returned demonstration, verbal cues required, tactile cues required, and needs further education  HOME EXERCISE PROGRAM: Access Code: [facility] URL: [email or web address] Date: 08/04/2024 Prepared by: Mliss  Exercises - Isometric Shoulder Flexion at Wall  - 1 x daily - 7 x weekly - 2 sets - 10 reps - Isometric Shoulder Extension at Wall  - 1 x daily - 7 x weekly - 2 sets - 10 reps - Isometric Shoulder Abduction at Wall  - 1 x daily - 7 x weekly - 2 sets - 10 reps - Standing Isometric Shoulder Internal Rotation at Doorway  - 1 x daily - 7 x weekly - 2 sets - 10 reps - Standing Isometric Shoulder External Rotation with Doorway  - 1 x daily - 7 x weekly - 2 sets - 10 reps - Seated Scapular Retraction  - 1 x daily - 7 x weekly -  2 sets - 10 reps - Standing Radial Nerve Glide (Mirrored)  - 2 x daily - 7 x weekly - 1 sets - 10 reps - 5 sec on/5 sec off hold - Median Nerve Flossing (Mirrored)  - 1 x daily - 3 x weekly - 2 sets - 10 reps - Seated Cervical Sidebending Stretch  - 2 x daily - 7 x weekly - 1 sets - 3 reps - 30 sec hold - Seated Levator Scapulae Stretch  - 2 x daily - 7 x weekly - 1 sets - 3 reps - 30 sec hold  ASSESSMENT:  CLINICAL IMPRESSION: *** Patient demonstrates tension in his radial and median nerves, so nerve floss/glides added to HEP. Attempted scapular series in standing today, but form is not as good as in supine. Advised pt to stay in supine for now. Increased traction today by 5# with good response after. He reports  some improvement in numbness. He would benefit from R single arm ER strengthening as L arm dominates with B.  OBJECTIVE IMPAIRMENTS: decreased activity tolerance, decreased balance, decreased coordination, decreased endurance, decreased mobility, decreased ROM, decreased strength, hypomobility, increased fascial restrictions, increased muscle spasms, impaired flexibility, impaired sensation, impaired UE functional use, improper body mechanics, postural dysfunction, and pain.   ACTIVITY LIMITATIONS: carrying, lifting, bending, sitting, standing, sleeping, transfers, bathing, dressing, and reach over head  PARTICIPATION LIMITATIONS: meal prep, cleaning, driving, community activity, and occupation  PERSONAL FACTORS: Age, Behavior pattern, Education, Past/current experiences, Profession, and 3+ comorbidities: Hx of bilateral shoulder arthroscopy, knee arthroscopy, A-Fib are also affecting patient's functional outcome.   REHAB POTENTIAL: Good  CLINICAL DECISION MAKING: Evolving/moderate complexity  EVALUATION COMPLEXITY: Moderate   GOALS: Goals reviewed with patient? Yes  SHORT TERM GOALS: Target date: 08/26/24  Pt will demonstrate competency with initial HEP so he is able to complete household tasks independently Baseline:  Goal status: INITIAL  2.  Pt will demonstrate self care strategies at home so he can improve independent habits Baseline:  Goal status: INITIAL     LONG TERM GOALS: Target date: 09/16/2024  Pt will demonstrate competency with advanced HEP to allow for self progression after discharge. Baseline:  Goal status: INITIAL  2.  Pt will increase cervical ROM to [facility] to allow him to flex neck to read charts at work Baseline: 30 deg Goal status: INITIAL  3.  Pt will increase shoulder strength to 4+ to 5/5 bilaterally  so he can carry his work bag using the right arm Baseline:  Goal status: INITIAL  4.  Pt will to report decreased cervical pain radiating down his  right arm during functional tasks, such as reaching overhead to place IV back. Baseline:  Goal status: INITIAL  5.  Pt will increase UEFS score to at least 75% so he is able to return to gardening fruit trees Baseline: 65% Goal status: INITIAL  6.  Pt will decrease Sit to stand time to 12 sec or less so he is increases functional capacity for going hiking Baseline: 16 sec  Goal status: INITIAL   PLAN:  PT FREQUENCY: 1-2x/week  PT DURATION: 8 weeks  PLANNED INTERVENTIONS: [id number]- PT Re-evaluation, 97750- Physical Performance Testing, 97110-Therapeutic exercises, 97530- Therapeutic activity, [id number]- Neuromuscular re-education, 97535- Self Care, [id number]- Manual therapy, [id number]- Gait training, [id number]- Orthotic Initial, [phone]- Canalith repositioning, [id number]- Aquatic Therapy, [id number]- Electrical stimulation (unattended), [id number]- Electrical stimulation (manual), [id number]- Vasopneumatic device, [id number]- Ultrasound, [id number]- Traction (mechanical), [id number]- Parrafin, [id number] (1-2 muscles), [id number] (3+ muscles)- Dry Needling, Patient/Family education, Balance training,  Stair training, Taping, Joint mobilization, Joint manipulation, Spinal manipulation, Spinal mobilization, Vestibular training, Cryotherapy, and Moist heat  PLAN FOR NEXT SESSION: Cont cervical stretches and neck flexibility, shoulder mobility, nerve glides, isometrics, review supine scap series with theraband, and cont traction   Mliss Cummins, PT  08/08/24 3:32 PM

## 2024-08-09 ENCOUNTER — Ambulatory Visit: Admitting: Physical Therapy

## 2024-08-12 ENCOUNTER — Ambulatory Visit

## 2024-08-12 DIAGNOSIS — R252 Cramp and spasm: Secondary | ICD-10-CM

## 2024-08-12 DIAGNOSIS — M542 Cervicalgia: Secondary | ICD-10-CM

## 2024-08-12 DIAGNOSIS — R293 Abnormal posture: Secondary | ICD-10-CM

## 2024-08-12 DIAGNOSIS — M5412 Radiculopathy, cervical region: Secondary | ICD-10-CM

## 2024-08-12 DIAGNOSIS — M6281 Muscle weakness (generalized): Secondary | ICD-10-CM

## 2024-08-12 NOTE — Therapy (Signed)
 OUTPATIENT PHYSICAL THERAPY CERVICAL TREATMENT   Patient Name: Terry Kim MRN: [id number] DOB:07/11/1961, 63 y.o., male Today's Date: 08/12/2024  END OF SESSION:  PT End of Session - 08/12/24 1009     Visit Number 4    Date for Recertification  09/16/24    Authorization Type [facility]    PT Start Time 1006    PT Stop Time 1053    PT Time Calculation (min) 47 min    Activity Tolerance Patient tolerated treatment well    Behavior During Therapy WFL for tasks assessed/performed            Past Medical History:  Diagnosis Date   Hypertension    Obesity    Persistent atrial fibrillation (HCC)    CHADS2VASC score is 1   Pure hypercholesterolemia    Supraventricular premature beats    Past Surgical History:  Procedure Laterality Date   KNEE ARTHROSCOPY WITH MEDIAL MENISECTOMY Left 07/05/2014   Procedure: LEFT ARTHROSCOPY KNEE WITH MEDIAL MENISECTOMY/DEBRIDEMENT/SHAVING (CHONDROPLASTY)/ARTHROSCOPY KNEE ABRASION ARTHROPLASTY/MULTIPLE DRILLING;  Surgeon: LELON JONETTA Shari Mickey., MD;  [facility]  [facility];  Service: Orthopedics;  Laterality: Left;   SHOULDER ARTHROSCOPY  2000   right   SHOULDER ARTHROSCOPY W/ ROTATOR CUFF REPAIR  2012   left   TONSILLECTOMY     Patient Active Problem List   Diagnosis Date Noted   Glucose intolerance 02/10/2023   OSA (obstructive sleep apnea) 08/30/2020   Supraventricular premature beats    Persistent atrial fibrillation (HCC)    Obesity    Pure hypercholesterolemia    Hypertension     PCP: Carlin Gull, MD   REFERRING PROVIDER: Dorn Ned, MD  REFERRING DIAG: Cervical radiculopathy on right side   THERAPY DIAG:  Cervicalgia  Radiculopathy, cervical region  Muscle weakness (generalized)  Cramp and spasm  Abnormal posture  Rationale for Evaluation and Treatment: Rehabilitation  ONSET DATE: June 24, 25  SUBJECTIVE:                                                                                                                                                                                                          SUBJECTIVE STATEMENT: I can tell I am getting better. The tingling was constant in my Rt thumb and first finger, but now the first finger tingling is almost completely gone, and the thumb is intermittent. I'm also seeing improvement with being able to do some of my work tasks with greater ease.   PERTINENT HISTORY:  Right shoulder arthroscopy in 2000 Left shoulder arthroscopy with rotator  cuff repair in 2012  PAIN:   PAIN:  Are you having pain? No, just a slight ache in the Rt shoulder   PRECAUTIONS: None  RED FLAGS: None     WEIGHT BEARING RESTRICTIONS: No  FALLS:  Has patient fallen in last 6 months? 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  DIAGNOSTIC FINDINGS:  Cervical MRI on 05/23/2024: IMPRESSION: 1. Cervical spondylosis and degenerative disc disease causing prominent foraminal stenosis at C3-4, C4-5, C5-6, and C6-7. Moderate right foraminal stenosis at C2-3.  PATIENT SURVEYS:  UEFS  Extreme difficulty/unable (0), Quite a bit of difficulty (1), Moderate difficulty (2), Little difficulty (3), No difficulty (4) Survey date:  07/27/24  Any of your usual work, household or school activities 2  2. Your usual hobbies, recreational/sport activities 2   3. Lifting a bag of groceries to waist level 3   4. Lifting a bag of  groceries above your head 1  5. Grooming your hair 3  6. Pushing up on your hands (I.e. from bathtub or chair) 2  7. Preparing food (I.e. peeling/cutting) 3  8. Driving  3  9. Vacuuming, sweeping, or raking 3  10. Dressing  4  11. Doing up buttons 3  12. Using tools/appliances 3  13. Opening doors 3  14. Cleaning  3  15. Tying or lacing shoes 3  16. Sleeping  2  17. Laundering clothes (I.e. washing, ironing, folding) 2  18. Opening a jar 2  19. Throwing a ball 3  20. Carrying a small suitcase with your affected limb.  2  Score total:  52/80 = 65%            COGNITION: Overall cognitive status: Within functional limits for tasks assessed  SENSATION: Not tested  POSTURE: rounded shoulders and forward head  PALPATION: TTP right upper trap    CERVICAL ROM:   Active ROM A/ROM (deg) eval  Flexion 30 deg   Extension 20 deg  Right lateral flexion 22 deg   Left lateral flexion 40 deg   Right rotation 58 deg   Left rotation 55 deg    (Blank rows = not tested)  UPPER EXTREMITY ROM:  Active ROM Right eval Left eval  Shoulder flexion 140 170  Shoulder extension    Shoulder abduction 140 180  Shoulder adduction    Shoulder extension    Shoulder internal rotation 8 55  Shoulder external rotation 60 80  Elbow flexion    Elbow extension    Wrist flexion    Wrist extension    Wrist ulnar deviation    Wrist radial deviation    Wrist pronation    Wrist supination     (Blank rows = not tested)  UPPER EXTREMITY MMT:  MMT Right eval Left eval  Shoulder flexion 3+ 4+  Shoulder extension 4+ 4+  Shoulder abduction 4 4+  Shoulder adduction    Shoulder extension 4+ 4+  Shoulder internal  rotation 4 4  Shoulder external rotation 4- 4+  Middle trapezius    Lower trapezius    Elbow flexion    Elbow extension    Wrist flexion    Wrist extension    Wrist ulnar deviation    Wrist radial deviation    Wrist pronation    Wrist supination    Grip strength     (Blank  rows = not tested)  CERVICAL SPECIAL TESTS:  Upper limb tension test (ULTT): Positive, Spurling's test: Positive, and Distraction test: Positive Bakody's Sign: positive Phalen's: positive Reverse phalen's: negative    FUNCTIONAL TESTS:  Eval: 5 times sit to stand: 16.40 sec   TREATMENT DATE:  08/12/24: Therapeutic Exercises UBE L1.5 x alternating fwd/bwd  Therapeutic Activities  Supine over half foam roll for following: Bil UE abd in a snow angel x 10 reps, 5 sec holds, then with VC's to hold chin tuck as able throughout supine scapular series with green theraband x 10 each except D2 x 8 each, VC's not to push into pain and to keep core engaged for stability Forearms on wall with yellow theraband for wall walks x 2, very challenging for pt and brief sharp pain reported at inferior Rt scap area, then scap retract x 3 reps assessing for technique but also challenging for pt, not painful, but due to weakness. Added to HEP and issued handout Seated in chair with head against purple ball (in pillowcase) on wall for following: Cervical retraction x 10, then cervical retraction with bil UE er with 1# and then alt UE flex 1# x 10 each. Pt reports this fel helpful in strengthening his OH reach when hanging IV bags at work which he still struggles with.  Cervical Traction Intermittent 60 on/10 off, 25 lbs max/10 lbs min x 12 mins, to help decrease cervical tightness and decrease Lt UE radiculopathy  08/09/24 UBE L1 x 4 min alternating fwd/bwd  UT and levator stretches 2x 30 sec B Radial nerve glide in standing 5 sec hold x 10 Supine scapular series with red theraband with VC's to hold chin tuck as able throughout: [employer] abd, bil er, narrow and wide grip flexion (Rt 8 reps due to hand fatigue from holding band) Single arm ER  Cervical Traction Intermittent 60 on/10 off, 25 lbs max/10 lbs min x 12 mins, to help decrease cervical tightness and decrease Lt UE radiculopathy   08/04/24 UBE L1  x 4 min alternating fwd/bwd  UT and levator stretches 2x 30 sec B Assessed upper limb tension Radial nerve glide in standing 5 sec hold x 10 Standing (advised to stay in supine for home) scapular series with red theraband with VC's to hold chin tuck as able throughout: Horz abd, bil er, narrow and wide grip flexion (Rt 8 reps due to hand fatigue from holding band)  Cervical Traction Intermittent 60 on/10 off, 25 lbs max/10 lbs min x 12 mins, to help decrease cervical tightness and decrease Lt UE radiculopathy     08/03/24: Therapeutic Activities Supine over full foam roll for following: Bil horz abd, bil UE scaption in a V x 10 each, bil UE abd in a snow angel x 10 with 3-5 sec holds. Encouraged pt to hold chin tuck as able throughout stretches Supine scapular series with yellow theraband with VC's to hold chin tuck as able throughout: Horz abd, bil er, narrow and wide grip flexion (Rt 8 reps due to hand fatigue from holding band), and then D2 x 10  each; added to HEP Manual Therapy STM with cocoa butter to Rt>Lt upper traps, Rt SCM (focusing on insertion where pt palpably very tight), and suboccipital release P/ROM into cervical Lt side bending and rotation with overpressure to Rt clavicle for increased stretch Cervical Traction Intermittent 60 on/10 off, 20 lbs max/5 lbs min x 12 mins, to help decrease cervical tightness and decrease Lt UE radiculopathy   07/27/24   Reviewed HEP and discussed role of PT Moist heat pack to right upper trap x10 min  PATIENT EDUCATION:   Person educated: Patient Education method: Explanation, Demonstration, Tactile cues, Verbal cues, and Handouts Education comprehension: verbalized understanding, returned demonstration, verbal cues required, tactile cues required, and needs further education  HOME EXERCISE PROGRAM: Access Code: [facility] URL: [email or web address] Date: 08/04/2024 Prepared by: Mliss  Exercises - Isometric Shoulder  Flexion at Wall  - 1 x daily - 7 x weekly - 2 sets - 10 reps - Isometric Shoulder Extension at Wall  - 1 x daily - 7 x weekly - 2 sets - 10 reps - Isometric Shoulder Abduction at Wall  - 1 x daily - 7 x weekly - 2 sets - 10 reps - Standing Isometric Shoulder Internal Rotation at Doorway  - 1 x daily - 7 x weekly - 2 sets - 10 reps - Standing Isometric Shoulder External Rotation with Doorway  - 1 x daily - 7 x weekly - 2 sets - 10 reps - Seated Scapular Retraction  - 1 x daily - 7 x weekly - 2 sets - 10 reps - Standing Radial Nerve Glide (Mirrored)  - 2 x daily - 7 x weekly - 1 sets - 10 reps - 5 sec on/5 sec off hold - Median Nerve Flossing (Mirrored)  - 1 x daily - 3 x weekly - 2 sets - 10 reps - Seated Cervical Sidebending Stretch  - 2 x daily - 7 x weekly - 1 sets - 3 reps - 30 sec hold - Seated Levator Scapulae Stretch  - 2 x daily - 7 x weekly - 1 sets - 3 reps - 30 sec hold  08/12/24: - Forearm Walks on Wall with Resistance Band  - 1 x daily - 7 x weekly - 1-2 sets - 3-5 reps and scap retract with forearms on wall  ASSESSMENT:  CLINICAL IMPRESSION: Pt reports noting he is gaining improvements with reduction of his radiculopathy symptoms since beginning this episode of care. He reports during session one of his biggest issues at work is struggling to hang an IV bag OH so incorporated some OH UE and scapular strength today that pt reports feeling very challenged by. He will benefit from continuing physical therapy at this time to cont progress towards further improving his radiculopathy and improving his OH strength allowing for increased ease with his work tasks, especially with OH reaching.   OBJECTIVE IMPAIRMENTS: decreased activity tolerance, decreased balance, decreased coordination, decreased endurance, decreased mobility, decreased ROM, decreased strength, hypomobility, increased fascial restrictions, increased muscle spasms, impaired flexibility, impaired sensation, impaired UE  functional use, improper body mechanics, postural dysfunction, and pain.   ACTIVITY LIMITATIONS: carrying, lifting, bending, sitting, standing, sleeping, transfers, bathing, dressing, and reach over head  PARTICIPATION LIMITATIONS: meal prep, cleaning, driving, community activity, and occupation  PERSONAL FACTORS: Age, Behavior pattern, Education, Past/current experiences, Profession, and 3+ comorbidities: Hx of bilateral shoulder arthroscopy, knee arthroscopy, A-Fib are also affecting patient's functional outcome.   REHAB POTENTIAL: Good  CLINICAL DECISION MAKING: Evolving/moderate complexity  EVALUATION COMPLEXITY:  Moderate   GOALS: Goals reviewed with patient? Yes  SHORT TERM GOALS: Target date: 08/26/24  Pt will demonstrate competency with initial HEP so he is able to complete household tasks independently Baseline:  Goal status: INITIAL  2.  Pt will demonstrate self care strategies at home so he can improve independent habits Baseline:  Goal status: INITIAL     LONG TERM GOALS: Target date: 09/16/2024  Pt will demonstrate competency with advanced HEP to allow for self progression after discharge. Baseline:  Goal status: INITIAL  2.  Pt will increase cervical ROM to [facility] to allow him to flex neck to read charts at work Baseline: 30 deg Goal status: INITIAL  3.  Pt will increase shoulder strength to 4+ to 5/5 bilaterally  so he can carry his work bag using the right arm Baseline:  Goal status: INITIAL  4.  Pt will to report decreased cervical pain radiating down his right arm during functional tasks, such as reaching overhead to place IV back. Baseline:  Goal status: INITIAL  5.  Pt will increase UEFS score to at least 75% so he is able to return to gardening fruit trees Baseline: 65% Goal status: INITIAL  6.  Pt will decrease Sit to stand time to 12 sec or less so he is increases functional capacity for going hiking Baseline: 16 sec  Goal status:  INITIAL   PLAN:  PT FREQUENCY: 1-2x/week  PT DURATION: 8 weeks  PLANNED INTERVENTIONS: [id number]- PT Re-evaluation, 97750- Physical Performance Testing, 97110-Therapeutic exercises, 97530- Therapeutic activity, [id number]- Neuromuscular re-education, 97535- Self Care, [id number]- Manual therapy, [id number]- Gait training, [phone]- Orthotic Initial, [phone]- Canalith repositioning, [id number]- Aquatic Therapy, [id number]- Electrical stimulation (unattended), [id number]- Electrical stimulation (manual), [id number]- Vasopneumatic device, [id number]- Ultrasound, [id number]- Traction (mechanical), [id number]- Parrafin, [id number] (1-2 muscles), [id number] (3+ muscles)- Dry Needling, Patient/Family education, Balance training, Stair training, Taping, Joint mobilization, Joint manipulation, Spinal manipulation, Spinal mobilization, Vestibular training, Cryotherapy, and Moist heat  PLAN FOR NEXT SESSION: Progress OH strength as pt tolerates to allow for increased ease with OH reach at work; Cont cervical stretches and neck flexibility, shoulder mobility, nerve glides, isometrics, review supine scap series with theraband prn, and cont traction.   Berwyn Knights, PTA 08/12/24 11:03 AM

## 2024-08-15 NOTE — Therapy (Signed)
 OUTPATIENT PHYSICAL THERAPY CERVICAL TREATMENT   Patient Name: Terry Kim MRN: [id number] DOB:1961-05-21, 63 y.o., male Today's Date: 08/16/2024  END OF SESSION:  PT End of Session - 08/16/24 0929     Visit Number 5    Date for Recertification  09/16/24    Authorization Type [facility]    PT Start Time [phone]    PT Stop Time 1015    PT Time Calculation (min) 46 min    Activity Tolerance Patient tolerated treatment well    Behavior During Therapy [facility] for tasks assessed/performed             Past Medical History:  Diagnosis Date   Hypertension    Obesity    Persistent atrial fibrillation (HCC)    CHADS2VASC score is 1   Pure hypercholesterolemia    Supraventricular premature beats    Past Surgical History:  Procedure Laterality Date   KNEE ARTHROSCOPY WITH MEDIAL MENISECTOMY Left 07/05/2014   Procedure: LEFT ARTHROSCOPY KNEE WITH MEDIAL MENISECTOMY/DEBRIDEMENT/SHAVING (CHONDROPLASTY)/ARTHROSCOPY KNEE ABRASION ARTHROPLASTY/MULTIPLE DRILLING;  Surgeon: LELON JONETTA Shari Mickey., MD;  Location: [facility];  Service: Orthopedics;  Laterality: Left;   SHOULDER ARTHROSCOPY  2000   right   SHOULDER ARTHROSCOPY W/ ROTATOR CUFF REPAIR  2012   left   TONSILLECTOMY     Patient Active Problem List   Diagnosis Date Noted   Glucose intolerance 02/10/2023   OSA (obstructive sleep apnea) 08/30/2020   Supraventricular premature beats    Persistent atrial fibrillation (HCC)    Obesity    Pure hypercholesterolemia    Hypertension     PCP: Carlin Gull, MD   REFERRING PROVIDER: Dorn Ned, MD  REFERRING DIAG: Cervical radiculopathy on right side   THERAPY DIAG:  Cervicalgia  Radiculopathy, cervical region  Muscle weakness (generalized)  Cramp and spasm  Abnormal posture  Rationale for Evaluation and Treatment: Rehabilitation  ONSET DATE: June 24, 25  SUBJECTIVE:                                                                                                                                                                                                          SUBJECTIVE STATEMENT: My back has flared up today.   PERTINENT HISTORY:  Right shoulder arthroscopy in 2000 Left shoulder arthroscopy with rotator cuff repair in 2012  PAIN:   PAIN:  Are you having pain? No, just a slight ache in the Rt shoulder   PRECAUTIONS: None  RED FLAGS: None     WEIGHT BEARING RESTRICTIONS: No  FALLS:  Has  patient fallen in last 6 months? 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  DIAGNOSTIC FINDINGS:  Cervical MRI on 05/23/2024: IMPRESSION: 1. Cervical spondylosis and degenerative disc disease causing prominent foraminal stenosis at C3-4, C4-5, C5-6, and C6-7. Moderate right foraminal stenosis at C2-3.  PATIENT SURVEYS:  UEFS  Extreme difficulty/unable (0), Quite a bit of difficulty (1), Moderate difficulty (2), Little difficulty (3), No difficulty (4) Survey date:  07/27/24  Any of your usual work, household or school activities 2  2. Your usual hobbies, recreational/sport activities 2   3. Lifting a bag of groceries to waist level 3   4. Lifting a bag of groceries above your head 1  5. Grooming your hair 3  6. Pushing up on your hands (I.e. from bathtub or chair) 2  7. Preparing food (I.e. peeling/cutting) 3  8. Driving  3  9. Vacuuming, sweeping, or raking 3  10. Dressing  4  11. Doing  up buttons 3  12. Using tools/appliances 3  13. Opening doors 3  14. Cleaning  3  15. Tying or lacing shoes 3  16. Sleeping  2  17. Laundering clothes (I.e. washing, ironing, folding) 2  18. Opening a jar 2  19. Throwing a ball 3  20. Carrying a small suitcase with your affected limb.  2  Score total:  52/80 = 65%            COGNITION: Overall cognitive status: Within functional limits for tasks assessed  SENSATION: Not tested  POSTURE: rounded shoulders and forward head  PALPATION: TTP right upper trap    CERVICAL ROM:   Active ROM A/ROM (deg) eval  Flexion 30 deg   Extension 20 deg  Right lateral flexion 22 deg   Left lateral flexion 40 deg   Right rotation 58 deg   Left rotation 55 deg    (Blank rows = not tested)  UPPER EXTREMITY ROM:  Active ROM Right eval Left eval  Shoulder flexion 140 170  Shoulder extension    Shoulder abduction 140 180  Shoulder adduction    Shoulder extension    Shoulder internal rotation 8 55  Shoulder external rotation 60 80  Elbow flexion    Elbow extension    Wrist flexion    Wrist extension    Wrist ulnar deviation    Wrist radial deviation    Wrist pronation    Wrist supination     (Blank rows = not tested)  UPPER EXTREMITY MMT:  MMT Right eval Left eval  Shoulder flexion 3+ 4+  Shoulder extension 4+ 4+  Shoulder abduction 4 4+  Shoulder adduction    Shoulder extension 4+ 4+  Shoulder internal rotation 4 4  Shoulder external rotation 4- 4+  Middle trapezius    Lower trapezius    Elbow flexion    Elbow extension    Wrist flexion    Wrist extension    Wrist ulnar deviation  Wrist radial deviation    Wrist pronation    Wrist supination    Grip strength     (Blank rows = not tested)  CERVICAL SPECIAL TESTS:  Upper limb tension test (ULTT): Positive, Spurling's test: Positive, and Distraction test: Positive Bakody's Sign: positive Phalen's: positive Reverse phalen's: negative    FUNCTIONAL TESTS:   Eval: 5 times sit to stand: 16.40 sec   TREATMENT DATE:  08/16/24: Therapeutic Exercises UBE L1.5 x alternating fwd/bwd  Therapeutic Activities  Supine over half foam roll for following: Bil UE abd in a snow angel x 10 reps, 5 sec holds, then with VC's to hold chin tuck as able throughout supine scapular series with green theraband x 10 each Horz abd, bil er,  except D2 x 8 each, VC's not to push into pain and to keep core engaged for stability Forearms on wall with blue loop (not band) for wall walks x 5, very challenging for pt  3 way reach outs with blue loop x 4 - horizontal most challenging Seated in chair with head against purple ball (in pillowcase) on wall for following: Cervical retraction x 10, then with 5 sec hold x 10, then cervical retraction with bil UE er with 2# and then alt UE flex 2# x 10 each, then alt OH press 2# x 10 ea.   Cervical Traction Intermittent 60 on/10 off, 25 lbs max/10 lbs min x 12 mins, to help decrease cervical tightness and decrease Lt UE radiculopathy   08/12/24: Therapeutic Exercises UBE L1.5 x alternating fwd/bwd  Therapeutic Activities  Supine over half foam roll for following: Bil UE abd in a snow angel x 10 reps, 5 sec holds, then with VC's to hold chin tuck as able throughout supine scapular series with green theraband x 10 each except D2 x 8 each, VC's not to push into pain and to keep core engaged for stability Forearms on wall with yellow theraband for wall walks x 2, very challenging for pt and brief sharp pain reported at inferior Rt scap area, then scap retract x 3 reps assessing for technique but also challenging for pt, not painful, but due to weakness. Added to HEP and issued handout Seated in chair with head against purple ball (in pillowcase) on wall for following: Cervical retraction x 10, then cervical retraction with bil UE er with 1# and then alt UE flex 1# x 10 each. Pt reports this fel helpful in strengthening his OH  reach when hanging IV bags at work which he still struggles with.  Cervical Traction Intermittent 60 on/10 off, 25 lbs max/10 lbs min x 12 mins, to help decrease cervical tightness and decrease Lt UE radiculopathy  08/09/24 UBE L1 x 4 min alternating fwd/bwd  UT and levator stretches 2x 30 sec B Radial nerve glide in standing 5 sec hold x 10 Supine scapular series with red theraband with VC's to hold chin tuck as able throughout: [employer] abd, bil er, narrow and wide grip flexion (Rt 8 reps due to hand fatigue from holding band) Single arm ER  Cervical Traction Intermittent 60 on/10 off, 25 lbs max/10 lbs min x 12 mins, to help decrease cervical tightness and decrease Lt UE radiculopathy   08/04/24 UBE L1 x 4 min alternating fwd/bwd  UT and levator stretches 2x 30 sec B Assessed upper limb tension Radial nerve glide in standing 5 sec hold x 10 Standing (advised to stay in supine for home) scapular series with red theraband with VC's  to hold chin tuck as able throughout: Horz abd, bil er, narrow and wide grip flexion (Rt 8 reps due to hand fatigue from holding band)  Cervical Traction Intermittent 60 on/10 off, 25 lbs max/10 lbs min x 12 mins, to help decrease cervical tightness and decrease Lt UE radiculopathy     08/03/24: Therapeutic Activities Supine over full foam roll for following: Bil horz abd, bil UE scaption in a V x 10 each, bil UE abd in a snow angel x 10 with 3-5 sec holds. Encouraged pt to hold chin tuck as able throughout stretches Supine scapular series with yellow theraband with VC's to hold chin tuck as able throughout: Horz abd, bil er, narrow and wide grip flexion (Rt 8 reps due to hand fatigue from holding band), and then D2 x 10 each; added to HEP Manual Therapy STM with cocoa butter to Rt>Lt upper traps, Rt SCM (focusing on insertion where pt palpably very tight), and suboccipital release P/ROM into cervical Lt side bending and rotation with overpressure to Rt  clavicle for increased stretch Cervical Traction Intermittent 60 on/10 off, 20 lbs max/5 lbs min x 12 mins, to help decrease cervical tightness and decrease Lt UE radiculopathy   07/27/24   Reviewed HEP and discussed role of PT Moist heat pack to right upper trap x10 min  PATIENT EDUCATION:   Person educated: Patient Education method: Explanation, Demonstration, Tactile cues, Verbal cues, and Handouts Education comprehension: verbalized understanding, returned demonstration, verbal cues required, tactile cues required, and needs further education  HOME EXERCISE PROGRAM: Access Code: [facility] URL: [email or web address] Date: 08/16/2024 Prepared by: Mliss  Exercises - Isometric Shoulder Flexion at Wall  - 1 x daily - 7 x weekly - 2 sets - 10 reps - Isometric Shoulder Extension at Wall  - 1 x daily - 7 x weekly - 2 sets - 10 reps - Isometric Shoulder Abduction at Wall  - 1 x daily - 7 x weekly - 2 sets - 10 reps - Standing Isometric Shoulder Internal Rotation at Doorway  - 1 x daily - 7 x weekly - 2 sets - 10 reps - Standing Isometric Shoulder External Rotation with Doorway  - 1 x daily - 7 x weekly - 2 sets - 10 reps - Seated Scapular Retraction  - 1 x daily - 7 x weekly - 2 sets - 10 reps - Standing Radial Nerve Glide (Mirrored)  - 2 x daily - 7 x weekly - 1 sets - 10 reps - 5 sec on/5 sec off hold - Median Nerve Flossing (Mirrored)  - 1 x daily - 3 x weekly - 2 sets - 10 reps - Seated Cervical Sidebending Stretch  - 2 x daily - 7 x weekly - 1 sets - 3 reps - 30 sec hold - Seated Levator Scapulae Stretch  - 2 x daily - 7 x weekly - 1 sets - 3 reps - 30 sec hold - Forearm Walks on Wall with Resistance Band  - 1 x daily - 7 x weekly - 1-2 sets - 3-5 reps - Wall Clock with Theraband (Mirrored)  - 1 x daily - 3 x weekly - 1-3 sets - 10 reps  ASSESSMENT:  CLINICAL IMPRESSION:  Patient presents with increased back pain today after moving a couch. He was able to tolerate  all exercises today well. We added OH press to seated exercises which were very challenging on the R. Also added wall clocks to HEP as this was challenging particularly horizontally. Continued  with cervical traction as patient's radicular symptoms continue to improve.   OBJECTIVE IMPAIRMENTS: decreased activity tolerance, decreased balance, decreased coordination, decreased endurance, decreased mobility, decreased ROM, decreased strength, hypomobility, increased fascial restrictions, increased muscle spasms, impaired flexibility, impaired sensation, impaired UE functional use, improper body mechanics, postural dysfunction, and pain.   ACTIVITY LIMITATIONS: carrying, lifting, bending, sitting, standing, sleeping, transfers, bathing, dressing, and reach over head  PARTICIPATION LIMITATIONS: meal prep, cleaning, driving, community activity, and occupation  PERSONAL FACTORS: Age, Behavior pattern, Education, Past/current experiences, Profession, and 3+ comorbidities: Hx of bilateral shoulder arthroscopy, knee arthroscopy, A-Fib are also affecting patient's functional outcome.   REHAB POTENTIAL: Good  CLINICAL DECISION MAKING: Evolving/moderate complexity  EVALUATION COMPLEXITY: Moderate   GOALS: Goals reviewed with patient? Yes  SHORT TERM GOALS: Target date: 08/26/24  Pt will demonstrate competency with initial HEP so he is able to complete household tasks independently Baseline:  Goal status: INITIAL  2.  Pt will demonstrate self care strategies at home so he can improve independent habits Baseline:  Goal status: INITIAL     LONG TERM GOALS: Target date: 09/16/2024  Pt will demonstrate competency with advanced HEP to allow for self progression after discharge. Baseline:  Goal status: INITIAL  2.  Pt will increase cervical ROM to [facility] to allow him to flex neck to read charts at work Baseline: 30 deg Goal status: INITIAL  3.  Pt will increase shoulder strength to 4+ to 5/5  bilaterally  so he can carry his work bag using the right arm Baseline:  Goal status: INITIAL  4.  Pt will to report decreased cervical pain radiating down his right arm during functional tasks, such as reaching overhead to place IV back. Baseline:  Goal status: INITIAL  5.  Pt will increase UEFS score to at least 75% so he is able to return to gardening fruit trees Baseline: 65% Goal status: INITIAL  6.  Pt will decrease Sit to stand time to 12 sec or less so he is increases functional capacity for going hiking Baseline: 16 sec  Goal status: INITIAL   PLAN:  PT FREQUENCY: 1-2x/week  PT DURATION: 8 weeks  PLANNED INTERVENTIONS: [id number]- PT Re-evaluation, 97750- Physical Performance Testing, 97110-Therapeutic exercises, 97530- Therapeutic activity, [id number]- Neuromuscular re-education, 97535- Self Care, [id number]- Manual therapy, [id number]- Gait training, [phone]- Orthotic Initial, [phone]- Canalith repositioning, [id number]- Aquatic Therapy, H9716- Electrical stimulation (unattended), [id number]- Electrical stimulation (manual), [id number]- Vasopneumatic device, [id number]- Ultrasound, [id number]- Traction (mechanical), [id number]- Parrafin, [id number] (1-2 muscles), [id number] (3+ muscles)- Dry Needling, Patient/Family education, Balance training, Stair training, Taping, Joint mobilization, Joint manipulation, Spinal manipulation, Spinal mobilization, Vestibular training, Cryotherapy, and Moist heat  PLAN FOR NEXT SESSION: Progress OH strength as pt tolerates to allow for increased ease with OH reach at work; Cont cervical stretches and neck flexibility, shoulder mobility, nerve glides, isometrics, review supine scap series with theraband prn, and cont traction.   Mliss Cummins, PT  08/16/24 10:07 AM

## 2024-08-16 ENCOUNTER — Ambulatory Visit: Admitting: Physical Therapy

## 2024-08-16 ENCOUNTER — Encounter: Payer: Self-pay | Admitting: Physical Therapy

## 2024-08-16 DIAGNOSIS — M542 Cervicalgia: Secondary | ICD-10-CM

## 2024-08-16 DIAGNOSIS — M6281 Muscle weakness (generalized): Secondary | ICD-10-CM

## 2024-08-16 DIAGNOSIS — R293 Abnormal posture: Secondary | ICD-10-CM

## 2024-08-16 DIAGNOSIS — M5412 Radiculopathy, cervical region: Secondary | ICD-10-CM

## 2024-08-16 DIAGNOSIS — R252 Cramp and spasm: Secondary | ICD-10-CM

## 2024-08-23 ENCOUNTER — Encounter: Payer: Self-pay | Admitting: Rehabilitative and Restorative Service Providers"

## 2024-08-23 ENCOUNTER — Ambulatory Visit: Attending: Neurosurgery | Admitting: Rehabilitative and Restorative Service Providers"

## 2024-08-23 DIAGNOSIS — R252 Cramp and spasm: Secondary | ICD-10-CM | POA: Diagnosis not present

## 2024-08-23 DIAGNOSIS — M542 Cervicalgia: Secondary | ICD-10-CM | POA: Diagnosis not present

## 2024-08-23 DIAGNOSIS — M6281 Muscle weakness (generalized): Secondary | ICD-10-CM | POA: Diagnosis not present

## 2024-08-23 DIAGNOSIS — M5412 Radiculopathy, cervical region: Secondary | ICD-10-CM | POA: Diagnosis not present

## 2024-08-23 DIAGNOSIS — R293 Abnormal posture: Secondary | ICD-10-CM | POA: Insufficient documentation

## 2024-08-23 NOTE — Therapy (Signed)
 OUTPATIENT PHYSICAL THERAPY CERVICAL TREATMENT   Patient Name: Terry Kim MRN: [id number] DOB:11-24-1960, 63 y.o., male Today's Date: 08/23/2024  END OF SESSION:  PT End of Session - 08/23/24 1316     Visit Number 6    Date for Recertification  09/16/24    Authorization Type [facility]    PT Start Time 1315    PT Stop Time 1355    PT Time Calculation (min) 40 min    Activity Tolerance Patient tolerated treatment well    Behavior During Therapy [facility] for tasks assessed/performed             Past Medical History:  Diagnosis Date   Hypertension    Obesity    Persistent atrial fibrillation (HCC)    CHADS2VASC score is 1   Pure hypercholesterolemia    Supraventricular premature beats    Past Surgical History:  Procedure Laterality Date   KNEE ARTHROSCOPY WITH MEDIAL MENISECTOMY Left 07/05/2014   Procedure: LEFT ARTHROSCOPY KNEE WITH MEDIAL MENISECTOMY/DEBRIDEMENT/SHAVING (CHONDROPLASTY)/ARTHROSCOPY KNEE ABRASION ARTHROPLASTY/MULTIPLE DRILLING;  Surgeon: LELON JONETTA Shari Mickey., MD;  Location: [facility];  Service: Orthopedics;  Laterality: Left;   SHOULDER ARTHROSCOPY  2000   right   SHOULDER ARTHROSCOPY W/ ROTATOR CUFF REPAIR  2012   left   TONSILLECTOMY     Patient Active Problem List   Diagnosis Date Noted   Glucose intolerance 02/10/2023   OSA (obstructive sleep apnea) 08/30/2020   Supraventricular premature beats    Persistent atrial fibrillation (HCC)    Obesity    Pure hypercholesterolemia    Hypertension     PCP: Carlin Gull, MD   REFERRING PROVIDER: Dorn Ned, MD  REFERRING DIAG: Cervical radiculopathy on right side   THERAPY DIAG:  Cervicalgia  Radiculopathy, cervical region  Muscle weakness (generalized)  Cramp and spasm  Abnormal posture  Rationale for Evaluation and Treatment: Rehabilitation  ONSET DATE: June 24, 25  SUBJECTIVE:                                                                                                                                                                                                          SUBJECTIVE STATEMENT: Pt reports that his back is feeling some better after injuring it from lifting the couch in his home.  Patient reports that he had some dizziness over the weekend.  PERTINENT HISTORY:  Right shoulder arthroscopy in 2000 Left shoulder arthroscopy with rotator cuff repair in 2012  PAIN:   Are you having pain? Yes NPRS scale: 2/10 Pain location: low back  PAIN TYPE: aching and dull Pain description: intermittent  Aggravating factors: lifting the couch in the house Relieving factors: stretching and rest    PRECAUTIONS: None  RED FLAGS: None     WEIGHT BEARING RESTRICTIONS: No  FALLS:  Has patient fallen in last 6 months? 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  DIAGNOSTIC FINDINGS:  Cervical MRI on 05/23/2024: IMPRESSION: 1. Cervical spondylosis and degenerative disc disease causing prominent foraminal stenosis at C3-4, C4-5, C5-6, and C6-7. Moderate right foraminal stenosis at C2-3.  PATIENT SURVEYS:  UEFS  Extreme difficulty/unable (0), Quite a bit of difficulty (1), Moderate difficulty (2), Little difficulty (3), No difficulty (4) Survey date:  07/27/24  Any of your usual work, household or school activities 2  2. Your usual hobbies, recreational/sport activities 2   3. Lifting a bag of  groceries to waist level 3   4. Lifting a bag of groceries above your head 1  5. Grooming your hair 3  6. Pushing up on your hands (I.e. from bathtub or chair) 2  7. Preparing food (I.e. peeling/cutting) 3  8. Driving  3  9. Vacuuming, sweeping, or raking 3  10. Dressing  4  11. Doing up buttons 3  12. Using tools/appliances 3  13. Opening doors 3  14. Cleaning  3  15. Tying or lacing shoes 3  16. Sleeping  2  17. Laundering clothes (I.e. washing, ironing, folding) 2  18. Opening a jar 2  19. Throwing a ball 3  20. Carrying a small suitcase with your affected limb.  2  Score total:  52/80 = 65%            COGNITION: Overall cognitive status: Within functional limits for tasks assessed  SENSATION: Not tested  POSTURE: rounded shoulders and forward head  PALPATION: TTP right upper trap    CERVICAL ROM:   Active ROM A/ROM (deg) eval  Flexion 30 deg   Extension 20 deg  Right lateral flexion 22 deg   Left lateral flexion 40 deg   Right rotation 58 deg   Left rotation 55 deg    (Blank rows = not tested)  UPPER EXTREMITY ROM:  Active ROM Right eval Left eval  Shoulder flexion 140 170  Shoulder extension    Shoulder abduction 140 180  Shoulder adduction    Shoulder extension    Shoulder internal rotation 8 55  Shoulder external rotation 60 80  Elbow flexion    Elbow extension    Wrist flexion    Wrist extension    Wrist ulnar deviation    Wrist radial deviation    Wrist pronation    Wrist supination     (Blank rows = not tested)  UPPER EXTREMITY MMT:  MMT Right eval Left eval  Shoulder flexion 3+ 4+  Shoulder extension 4+ 4+  Shoulder abduction 4 4+  Shoulder adduction    Shoulder extension 4+ 4+  Shoulder internal  rotation 4 4  Shoulder external rotation 4- 4+  Middle trapezius    Lower trapezius    Elbow flexion    Elbow extension    Wrist flexion    Wrist extension    Wrist ulnar deviation    Wrist radial deviation    Wrist pronation     Wrist supination    Grip strength     (Blank rows = not tested)  CERVICAL SPECIAL TESTS:  Upper limb tension test (ULTT): Positive, Spurling's test: Positive, and Distraction test: Positive Bakody's Sign: positive Phalen's: positive Reverse phalen's: negative    FUNCTIONAL TESTS:  Eval: 5 times sit to stand: 16.40 sec   TREATMENT DATE:   08/23/2024: UBE level 1.0 x3 min each direction with PT present to discuss status Dix Hallpike positive on the left and proceeded with canalith repositioning with Epley Maneuver Educated patient on self epley maneuver for home use Seated green pball rollout x10 Seated cervical SNAGs for rotation x10 bilat Seated cervical SNAGs for extension x10 Standing 3 way scapular stabilization with blue loop x5 bilat Forearms on wall with blue loop (not band) for wall walks 2x5 Standing shoulder horizontal abduction with green tband 2x10 (difficulty on second set) Standing shoulder D2 with green tband x10 bilat Standing shoulder ER with green tband 2x10 Standing lat pulldown 25# x10   08/16/24: Therapeutic Exercises UBE L1.5 x alternating fwd/bwd  Therapeutic Activities  Supine over half foam roll for following: Bil UE abd in a snow angel x 10 reps, 5 sec holds, then with VC's to hold chin tuck as able throughout supine scapular series with green theraband x 10 each Horz abd, bil er,  except D2 x 8 each, VC's not to push into pain and to keep core engaged for stability Forearms on wall with blue loop (not band) for wall walks x 5, very challenging for pt  3 way reach outs with blue loop x 4 - horizontal most challenging Seated in chair with head against purple ball (in pillowcase) on wall for following: Cervical retraction x 10, then with 5 sec hold x 10, then cervical retraction with bil UE er with 2# and then alt UE flex 2# x 10 each, then alt OH press 2# x 10 ea.   Cervical Traction Intermittent 60 on/10 off, 25 lbs max/10 lbs min x 12 mins,  to help decrease cervical tightness and decrease Lt UE radiculopathy   08/12/24: Therapeutic Exercises UBE L1.5 x alternating fwd/bwd  Therapeutic Activities  Supine over half foam roll for following: Bil UE abd in a snow angel x 10 reps, 5 sec holds, then with VC's to hold chin tuck as able throughout supine scapular series with green theraband x 10 each except D2 x 8 each, VC's not to push into pain and to keep core engaged for stability Forearms on wall with yellow theraband for wall walks x 2, very challenging for pt and brief sharp pain reported at inferior Rt scap area, then scap retract x 3 reps assessing for technique but also challenging for pt, not painful, but due to weakness. Added to HEP and issued handout Seated in chair with head against purple ball (in pillowcase) on wall for following: Cervical retraction x 10, then cervical retraction with bil UE er with 1# and then alt UE flex 1# x 10 each. Pt reports this fel helpful in strengthening his OH reach when hanging IV bags at work which he still struggles with.  Cervical Traction Intermittent 60  on/10 off, 25 lbs max/10 lbs min x 12 mins, to help decrease cervical tightness and decrease Lt UE radiculopathy   PATIENT EDUCATION:   Person educated: Patient Education method: Explanation, Demonstration, Tactile cues, Verbal cues, and Handouts Education comprehension: verbalized understanding, returned demonstration, verbal cues required, tactile cues required, and needs further education  HOME EXERCISE PROGRAM: Access Code: [facility] URL: [email or web address] Date: 08/23/2024 Prepared by: Jarrell Laming  Exercises - Isometric Shoulder Flexion at Wall  - 1 x daily - 7 x weekly - 2 sets - 10 reps - Isometric Shoulder Extension at Wall  - 1 x daily - 7 x weekly - 2 sets - 10 reps - Isometric Shoulder Abduction at Wall  - 1 x daily - 7 x weekly - 2 sets - 10 reps - Standing Isometric Shoulder Internal Rotation  at Doorway  - 1 x daily - 7 x weekly - 2 sets - 10 reps - Standing Isometric Shoulder External Rotation with Doorway  - 1 x daily - 7 x weekly - 2 sets - 10 reps - Seated Scapular Retraction  - 1 x daily - 7 x weekly - 2 sets - 10 reps - Standing Radial Nerve Glide (Mirrored)  - 2 x daily - 7 x weekly - 1 sets - 10 reps - 5 sec on/5 sec off hold - Median Nerve Flossing (Mirrored)  - 1 x daily - 3 x weekly - 2 sets - 10 reps - Seated Cervical Sidebending Stretch  - 2 x daily - 7 x weekly - 1 sets - 3 reps - 30 sec hold - Seated Levator Scapulae Stretch  - 2 x daily - 7 x weekly - 1 sets - 3 reps - 30 sec hold - Forearm Walks on Wall with Resistance Band  - 1 x daily - 7 x weekly - 1-2 sets - 3-5 reps - Wall Clock with Theraband (Mirrored)  - 1 x daily - 3 x weekly - 1-3 sets - 10 reps - Self-Epley Maneuver Left Ear  - 1 x daily - 7 x weekly - 1-2 reps  ASSESSMENT:  CLINICAL IMPRESSION:  Mr Earnshaw presents to skilled PT reporting that he is still having some back pain, but it is better than last visit.  Patient states that he now has a [facility] home cervical traction unit that he can use independently.  Patient additionally states that he has been doing his HEP and states that he has been trying to use better body mechanics for activities at work to decrease his pain.  Patient continues to have scapular weakness and difficulty with exercises involving the blue loop for scapular strengthening.  Patient continues reports that he had some dizziness over the weekend, so assessed with Trenda Craze and pt with slight positive.  Proceeded with canalith repositioning with Epley Maneuver and patient did report less symptoms after.  Provided patient with a handout for HEP if he has this in the future.  Bricen reported good response to cervical SNAGs during session today.  Patient continues to require skilled PT to progress towards goal related activities.  OBJECTIVE IMPAIRMENTS: decreased activity tolerance,  decreased balance, decreased coordination, decreased endurance, decreased mobility, decreased ROM, decreased strength, hypomobility, increased fascial restrictions, increased muscle spasms, impaired flexibility, impaired sensation, impaired UE functional use, improper body mechanics, postural dysfunction, and pain.   ACTIVITY LIMITATIONS: carrying, lifting, bending, sitting, standing, sleeping, transfers, bathing, dressing, and reach over head  PARTICIPATION LIMITATIONS: meal prep, cleaning, driving, community activity, and occupation  PERSONAL  FACTORS: Age, Behavior pattern, Education, Past/current experiences, Profession, and 3+ comorbidities: Hx of bilateral shoulder arthroscopy, knee arthroscopy, A-Fib are also affecting patient's functional outcome.   REHAB POTENTIAL: Good  CLINICAL DECISION MAKING: Evolving/moderate complexity  EVALUATION COMPLEXITY: Moderate   GOALS: Goals reviewed with patient? Yes  SHORT TERM GOALS: Target date: 08/26/24  Pt will demonstrate competency with initial HEP so he is able to complete household tasks independently Baseline:  Goal status: Met on 08/23/2024  2.  Pt will demonstrate self care strategies at home so he can improve independent habits Baseline:  Goal status: Met on 08/23/2024     LONG TERM GOALS: Target date: 09/16/2024  Pt will demonstrate competency with advanced HEP to allow for self progression after discharge. Baseline:  Goal status: INITIAL  2.  Pt will increase cervical ROM to [facility] to allow him to flex neck to read charts at work Baseline: 30 deg Goal status: INITIAL  3.  Pt will increase shoulder strength to 4+ to 5/5 bilaterally  so he can carry his work bag using the right arm Baseline:  Goal status: INITIAL  4.  Pt will to report decreased cervical pain radiating down his right arm during functional tasks, such as reaching overhead to place IV back. Baseline:  Goal status: INITIAL  5.  Pt will increase UEFS score  to at least 75% so he is able to return to gardening fruit trees Baseline: 65% Goal status: INITIAL  6.  Pt will decrease Sit to stand time to 12 sec or less so he is increases functional capacity for going hiking Baseline: 16 sec  Goal status: INITIAL   PLAN:  PT FREQUENCY: 1-2x/week  PT DURATION: 8 weeks  PLANNED INTERVENTIONS: 97164- PT Re-evaluation, 97750- Physical Performance Testing, 97110-Therapeutic exercises, 97530- Therapeutic activity, [id number]- Neuromuscular re-education, 97535- Self Care, [id number]- Manual therapy, [id number]- Gait training, [id number]- Orthotic Initial, [phone]- Canalith repositioning, [id number]- Aquatic Therapy, H9716- Electrical stimulation (unattended), [id number]- Electrical stimulation (manual), [id number]- Vasopneumatic device, [id number]- Ultrasound, [id number]- Traction (mechanical), [id number]- Parrafin, [id number] (1-2 muscles), [id number] (3+ muscles)- Dry Needling, Patient/Family education, Balance training, Stair training, Taping, Joint mobilization, Joint manipulation, Spinal manipulation, Spinal mobilization, Vestibular training, Cryotherapy, and Moist heat  PLAN FOR NEXT SESSION: Progress OH strength as pt tolerates to allow for increased ease with OH reach at work; Cont cervical stretches and neck flexibility, shoulder mobility, nerve glides, isometrics, review supine scap series with theraband prn, and cont traction.   Jarrell Laming, PT, DPT 08/23/24, 1:57 PM  [facility] [address], [address] [place], [place] [address] Phone # [phone] Fax [phone]

## 2024-08-25 ENCOUNTER — Other Ambulatory Visit (HOSPITAL_BASED_OUTPATIENT_CLINIC_OR_DEPARTMENT_OTHER): Payer: Self-pay

## 2024-08-25 ENCOUNTER — Ambulatory Visit: Admitting: Rehabilitative and Restorative Service Providers"

## 2024-08-25 DIAGNOSIS — H8110 Benign paroxysmal vertigo, unspecified ear: Secondary | ICD-10-CM | POA: Diagnosis not present

## 2024-08-25 DIAGNOSIS — Z6839 Body mass index (BMI) 39.0-39.9, adult: Secondary | ICD-10-CM | POA: Diagnosis not present

## 2024-08-25 MED ORDER — MECLIZINE HCL 12.5 MG PO TABS
12.5000 mg | ORAL_TABLET | Freq: Two times a day (BID) | ORAL | 1 refills | Status: DC | PRN
  Filled 2024-08-25: qty 14, 7d supply, fill #0
  Filled 2024-09-14: qty 14, 7d supply, fill #1

## 2024-08-25 MED FILL — Metformin HCl Tab 500 MG: ORAL | 30 days supply | Qty: 60 | Fill #2 | Status: AC

## 2024-08-28 NOTE — Therapy (Signed)
 OUTPATIENT PHYSICAL THERAPY CERVICAL TREATMENT   Patient Name: Terry Kim MRN: [id number] DOB:[birth year], 63 y.o., male Today's Date: 08/29/2024  END OF SESSION:  PT End of Session - 08/29/24 0926     Visit Number 7    Date for Recertification  09/16/24    Authorization Type [facility]    PT Start Time [phone]    PT Stop Time 1008    PT Time Calculation (min) 42 min    Activity Tolerance Patient tolerated treatment well    Behavior During Therapy [facility] for tasks assessed/performed              Past Medical History:  Diagnosis Date   Hypertension    Obesity    Persistent atrial fibrillation (HCC)    CHADS2VASC score is 1   Pure hypercholesterolemia    Supraventricular premature beats    Past Surgical History:  Procedure Laterality Date   KNEE ARTHROSCOPY WITH MEDIAL MENISECTOMY Left 07/05/2014   Procedure: LEFT ARTHROSCOPY KNEE WITH MEDIAL MENISECTOMY/DEBRIDEMENT/SHAVING (CHONDROPLASTY)/ARTHROSCOPY KNEE ABRASION ARTHROPLASTY/MULTIPLE DRILLING;  Surgeon: LELON JONETTA Shari Mickey., MD;  Location: [facility];  Service: Orthopedics;  Laterality: Left;   SHOULDER ARTHROSCOPY  2000   right   SHOULDER ARTHROSCOPY W/ ROTATOR CUFF REPAIR  2012   left   TONSILLECTOMY     Patient Active Problem List   Diagnosis Date Noted   Glucose intolerance 02/10/2023   OSA (obstructive sleep apnea) 08/30/2020   Supraventricular premature beats    Persistent atrial fibrillation (HCC)    Obesity    Pure hypercholesterolemia    Hypertension     PCP: Carlin Gull, MD   REFERRING PROVIDER: Dorn Ned, MD  REFERRING DIAG: Cervical radiculopathy on right side   THERAPY DIAG:  Cervicalgia  Radiculopathy, cervical region  Muscle weakness (generalized)  Cramp and spasm  Abnormal posture  Rationale for Evaluation and Treatment: Rehabilitation  ONSET DATE: June 24, 25  SUBJECTIVE:                                                                                                                                                                                                          SUBJECTIVE STATEMENT: Doing well. The back is getting better. Not sharp anymore.  PERTINENT HISTORY:  Right shoulder arthroscopy in 2000 Left shoulder arthroscopy with rotator cuff repair in 2012  PAIN:   Are you having pain? Yes NPRS scale: 2/10 Pain location: low back PAIN TYPE: aching and dull Pain description: intermittent  Aggravating factors: lifting the couch in the house Relieving factors:  stretching and rest    PRECAUTIONS: None  RED FLAGS: None     WEIGHT BEARING RESTRICTIONS: No  FALLS:  Has patient fallen in last 6 months? 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  DIAGNOSTIC FINDINGS:  Cervical MRI on 05/23/2024: IMPRESSION: 1. Cervical spondylosis and degenerative disc disease causing prominent foraminal stenosis at C3-4, C4-5, C5-6, and C6-7. Moderate right foraminal stenosis at C2-3.  PATIENT SURVEYS:  UEFS  Extreme difficulty/unable (0), Quite a bit of difficulty (1), Moderate difficulty (2), Little difficulty (3), No difficulty (4) Survey date:  07/27/24  Any of your usual work, household or school activities 2  2. Your usual hobbies, recreational/sport activities 2   3. Lifting a bag of groceries to waist level 3   4. Lifting a bag of groceries above your head 1  5. Grooming your hair 3  6.  Pushing up on your hands (I.e. from bathtub or chair) 2  7. Preparing food (I.e. peeling/cutting) 3  8. Driving  3  9. Vacuuming, sweeping, or raking 3  10. Dressing  4  11. Doing up buttons 3  12. Using tools/appliances 3  13. Opening doors 3  14. Cleaning  3  15. Tying or lacing shoes 3  16. Sleeping  2  17. Laundering clothes (I.e. washing, ironing, folding) 2  18. Opening a jar 2  19. Throwing a ball 3  20. Carrying a small suitcase with your affected limb.  2  Score total:  52/80 = 65%            COGNITION: Overall cognitive status: Within functional limits for tasks assessed  SENSATION: Not tested  POSTURE: rounded shoulders and forward head  PALPATION: TTP right upper trap    CERVICAL ROM:   Active ROM A/ROM (deg) eval  Flexion 30 deg   Extension 20 deg  Right lateral flexion 22 deg   Left lateral flexion 40 deg   Right rotation 58 deg   Left rotation 55 deg    (Blank rows = not tested)  UPPER EXTREMITY ROM:  Active ROM Right eval Left eval  Shoulder flexion 140 170  Shoulder extension    Shoulder abduction 140 180  Shoulder adduction    Shoulder extension    Shoulder internal rotation 8 55  Shoulder external rotation 60 80  Elbow flexion    Elbow extension    Wrist flexion    Wrist extension    Wrist ulnar deviation    Wrist radial deviation    Wrist pronation    Wrist supination     (Blank rows = not tested)  UPPER EXTREMITY MMT:  MMT Right eval Left eval Right 10/13 Left  10/13  Shoulder flexion 3+ 4+ 4+ 5  Shoulder extension 4+ 4+ 5 5  Shoulder abduction 4 4+ 4 5  Shoulder adduction      Shoulder internal rotation 4 4 4+ 4+  Shoulder external rotation 4- 4+  4+ 5   (Blank rows = not tested)  CERVICAL SPECIAL TESTS:  Upper limb tension test (ULTT): Positive, Spurling's test: Positive, and Distraction test: Positive Bakody's Sign: positive Phalen's: positive Reverse phalen's: negative    FUNCTIONAL TESTS:  Eval: 5 times  sit to stand: 16.40 sec   TREATMENT DATE:   08/29/2024: UBE level 1.0 x 6 min alternating direction each minute with PT present to discuss status MMT Standing 3 way scapular stabilization with blue loop 2x6  bilat Forearms on wall with light green loop (not band) for wall walks 1x8, 1x5 Standing shoulder horizontal abduction with green tband 2x10 (difficulty on second set) Standing ABD green 2x 10 Standing flex green 2x 10 Standing shoulder D2 with green tband x10 bilat Standing shoulder ER with green tband 2x10 Standing shoulder IR with green tband x 10, then blue x 10 B Standing lat pulldown 25# 2 x10 Seated green pball rollout x10 Seated cervical SNAGs for rotation x10 bilat Seated cervical SNAGs for extension x10 Seated in chair with head against purple ball (in pillowcase) on wall for following: Cervical retraction with bil UE ER with 2# 2x10 and then UE flex 2# 2x 10 each, then alt OH press 2# x 8 ea, 1x5.    08/23/2024: UBE level 1.0 x3 min each direction with PT present to discuss status Dix Hallpike positive on the left and proceeded with canalith repositioning with Epley Maneuver Educated patient on self epley maneuver for home use Seated green pball rollout x10 Seated cervical SNAGs for rotation x10 bilat Seated cervical SNAGs for extension x10 Standing 3 way scapular stabilization with blue loop x5 bilat Forearms on wall with blue loop (not band) for wall walks 2x5 Standing shoulder horizontal abduction with green tband 2x10 (difficulty on second set) Standing shoulder D2 with green tband x10 bilat Standing shoulder ER with green tband 2x10 Standing lat pulldown 25# x10   08/16/24: Therapeutic Exercises UBE L1.5 x alternating fwd/bwd  Therapeutic Activities  Supine over half foam roll for following: Bil UE abd in a snow angel x 10 reps, 5 sec holds, then with VC's to hold chin tuck as able throughout supine scapular series with green theraband x 10 each Horz  abd, bil er,  except D2 x 8 each, VC's not to push into pain and to keep core engaged for stability Forearms on wall with blue loop (not band) for wall walks x 5, very challenging for pt  3 way reach outs with blue loop x 4 - horizontal most challenging Seated in chair with head against purple ball (in pillowcase) on wall for following: Cervical retraction x 10, then with 5 sec hold x 10, then cervical retraction with bil UE er with 2# and then alt UE flex 2# x 10 each, then alt OH press 2# x 10 ea.   Cervical Traction Intermittent 60 on/10 off, 25 lbs max/10 lbs min x 12 mins, to help decrease cervical tightness and decrease Lt UE radiculopathy   08/12/24: Therapeutic Exercises UBE L1.5 x alternating fwd/bwd  Therapeutic Activities  Supine over half foam roll for following: Bil UE abd in a snow angel x 10 reps, 5 sec holds, then with VC's to hold chin tuck as able throughout supine scapular series with green theraband x 10 each except D2 x 8 each, VC's not to push into pain and to keep core engaged for stability Forearms on wall with yellow theraband for wall walks x 2, very challenging for pt and brief  sharp pain reported at inferior Rt scap area, then scap retract x 3 reps assessing for technique but also challenging for pt, not painful, but due to weakness. Added to HEP and issued handout Seated in chair with head against purple ball (in pillowcase) on wall for following: Cervical retraction x 10, then cervical retraction with bil UE er with 1# and then alt UE flex 1# x 10 each. Pt reports this fel helpful in strengthening his OH reach when hanging IV bags at work which he still struggles with.  Cervical Traction Intermittent 60 on/10 off, 25 lbs max/10 lbs min x 12 mins, to help decrease cervical tightness and decrease Lt UE radiculopathy   PATIENT EDUCATION:  HEP update Person educated: Patient Education method: Explanation, Demonstration, Tactile cues, Verbal cues, and  Handouts Education comprehension: verbalized understanding, returned demonstration, verbal cues required, tactile cues required, and needs further education  HOME EXERCISE PROGRAM: Access Code: [facility] URL: [email or web address] Date: 08/29/2024 Prepared by: Mliss  Exercises - Isometric Shoulder Flexion at Wall  - 1 x daily - 7 x weekly - 2 sets - 10 reps - Isometric Shoulder Extension at Wall  - 1 x daily - 7 x weekly - 2 sets - 10 reps - Isometric Shoulder Abduction at Wall  - 1 x daily - 7 x weekly - 2 sets - 10 reps - Standing Isometric Shoulder Internal Rotation at Doorway  - 1 x daily - 7 x weekly - 2 sets - 10 reps - Standing Isometric Shoulder External Rotation with Doorway  - 1 x daily - 7 x weekly - 2 sets - 10 reps - Seated Scapular Retraction  - 1 x daily - 7 x weekly - 2 sets - 10 reps - Standing Radial Nerve Glide (Mirrored)  - 2 x daily - 7 x weekly - 1 sets - 10 reps - 5 sec on/5 sec off hold - Median Nerve Flossing (Mirrored)  - 1 x daily - 3 x weekly - 2 sets - 10 reps - Seated Cervical Sidebending Stretch  - 2 x daily - 7 x weekly - 1 sets - 3 reps - 30 sec hold - Seated Levator Scapulae Stretch  - 2 x daily - 7 x weekly - 1 sets - 3 reps - 30 sec hold - Forearm Walks on Wall with Resistance Band  - 1 x daily - 7 x weekly - 1-2 sets - 3-5 reps - Wall Clock with Theraband (Mirrored)  - 1 x daily - 3 x weekly - 1-3 sets - 10 reps - Self-Epley Maneuver Left Ear  - 1 x daily - 7 x weekly - 1-2 reps - Standing Shoulder Flexion with Resistance  - 1 x daily - 3 x weekly - 2 sets - 10 reps - Standing Single Arm Shoulder Abduction with Resistance  - 1 x daily - 3 x weekly - 2 sets - 10 reps - Standing Shoulder Internal Rotation with Anchored Resistance  - 1 x daily - 3 x weekly - 2 sets - 10 reps  ASSESSMENT:  CLINICAL IMPRESSION:  Patient is progressing well with strengthening. He still has weakness with R shoulder flex and ABD and B ER. He also has weakness  with OH reaching. He has not had pain radiating down the arm in a while, but still has pain in the shoulder during functional tasks. He continues to demonstrate potential for improvement and would benefit from continued skilled therapy to address impairments.     OBJECTIVE IMPAIRMENTS: decreased  activity tolerance, decreased balance, decreased coordination, decreased endurance, decreased mobility, decreased ROM, decreased strength, hypomobility, increased fascial restrictions, increased muscle spasms, impaired flexibility, impaired sensation, impaired UE functional use, improper body mechanics, postural dysfunction, and pain.   ACTIVITY LIMITATIONS: carrying, lifting, bending, sitting, standing, sleeping, transfers, bathing, dressing, and reach over head  PARTICIPATION LIMITATIONS: meal prep, cleaning, driving, community activity, and occupation  PERSONAL FACTORS: Age, Behavior pattern, Education, Past/current experiences, Profession, and 3+ comorbidities: Hx of bilateral shoulder arthroscopy, knee arthroscopy, A-Fib are also affecting patient's functional outcome.   REHAB POTENTIAL: Good  CLINICAL DECISION MAKING: Evolving/moderate complexity  EVALUATION COMPLEXITY: Moderate   GOALS: Goals reviewed with patient? Yes  SHORT TERM GOALS: Target date: 08/26/24  Pt will demonstrate competency with initial HEP so he is able to complete household tasks independently Baseline:  Goal status: Met on 08/23/2024  2.  Pt will demonstrate self care strategies at home so he can improve independent habits Baseline:  Goal status: Met on 08/23/2024     LONG TERM GOALS: Target date: 09/16/2024  Pt will demonstrate competency with advanced HEP to allow for self progression after discharge. Baseline:  Goal status: INITIAL  2.  Pt will increase cervical ROM to [facility] to allow him to flex neck to read charts at work Baseline: 30 deg Goal status: INITIAL  3.  Pt will increase shoulder strength to 4+  to 5/5 bilaterally  so he can carry his work bag using the right arm Baseline:  Goal status: IN PROGRESS 10/13  4.  Pt will to report decreased cervical pain radiating down his right arm during functional tasks, such as reaching overhead to place IV back. Baseline:  Goal status: PARTIALLY MET 10/13 - not down arm, but some shoulder pain  5.  Pt will increase UEFS score to at least 75% so he is able to return to gardening fruit trees Baseline: 65% Goal status: INITIAL  6.  Pt will decrease Sit to stand time to 12 sec or less so he is increases functional capacity for going hiking Baseline: 16 sec  Goal status: INITIAL   PLAN:  PT FREQUENCY: 1-2x/week  PT DURATION: 8 weeks  PLANNED INTERVENTIONS: 97164- PT Re-evaluation, 97750- Physical Performance Testing, 97110-Therapeutic exercises, 97530- Therapeutic activity, [id number]- Neuromuscular re-education, 97535- Self Care, [id number]- Manual therapy, [id number]- Gait training, [phone]- Orthotic Initial, [phone]- Canalith repositioning, [id number]- Aquatic Therapy, [id number]- Electrical stimulation (unattended), [id number]- Electrical stimulation (manual), [id number]- Vasopneumatic device, [id number]- Ultrasound, [id number]- Traction (mechanical), [id number]- Parrafin, [id number] (1-2 muscles), [id number] (3+ muscles)- Dry Needling, Patient/Family education, Balance training, Stair training, Taping, Joint mobilization, Joint manipulation, Spinal manipulation, Spinal mobilization, Vestibular training, Cryotherapy, and Moist heat  PLAN FOR NEXT SESSION: Progress OH strength as pt tolerates to allow for increased ease with OH reach at work; Cont cervical stretches and neck flexibility, shoulder mobility, nerve glides, isometrics, review supine scap series with theraband prn, and cont traction.   Mliss Cummins, PT 08/29/24, 10:13 AM  [facility] [address], [address] [place], [place] [address] Phone # [phone] Fax [phone]

## 2024-08-29 ENCOUNTER — Encounter: Payer: Self-pay | Admitting: Physical Therapy

## 2024-08-29 ENCOUNTER — Ambulatory Visit: Admitting: Physical Therapy

## 2024-08-29 DIAGNOSIS — R293 Abnormal posture: Secondary | ICD-10-CM | POA: Diagnosis not present

## 2024-08-29 DIAGNOSIS — R252 Cramp and spasm: Secondary | ICD-10-CM | POA: Diagnosis not present

## 2024-08-29 DIAGNOSIS — M5412 Radiculopathy, cervical region: Secondary | ICD-10-CM

## 2024-08-29 DIAGNOSIS — M542 Cervicalgia: Secondary | ICD-10-CM

## 2024-08-29 DIAGNOSIS — M6281 Muscle weakness (generalized): Secondary | ICD-10-CM

## 2024-08-30 ENCOUNTER — Other Ambulatory Visit (HOSPITAL_BASED_OUTPATIENT_CLINIC_OR_DEPARTMENT_OTHER): Payer: Self-pay

## 2024-08-30 MED ORDER — FLUZONE 0.5 ML IM SUSY
0.5000 mL | PREFILLED_SYRINGE | Freq: Once | INTRAMUSCULAR | 0 refills | Status: AC
  Filled 2024-08-30: qty 0.5, 1d supply, fill #0

## 2024-09-01 ENCOUNTER — Encounter: Payer: Self-pay | Admitting: Rehabilitative and Restorative Service Providers"

## 2024-09-01 ENCOUNTER — Ambulatory Visit: Admitting: Rehabilitative and Restorative Service Providers"

## 2024-09-01 DIAGNOSIS — R252 Cramp and spasm: Secondary | ICD-10-CM

## 2024-09-01 DIAGNOSIS — M5412 Radiculopathy, cervical region: Secondary | ICD-10-CM

## 2024-09-01 DIAGNOSIS — R293 Abnormal posture: Secondary | ICD-10-CM | POA: Diagnosis not present

## 2024-09-01 DIAGNOSIS — M542 Cervicalgia: Secondary | ICD-10-CM | POA: Diagnosis not present

## 2024-09-01 DIAGNOSIS — M6281 Muscle weakness (generalized): Secondary | ICD-10-CM

## 2024-09-01 NOTE — Therapy (Signed)
 OUTPATIENT PHYSICAL THERAPY CERVICAL TREATMENT   Patient Name: Terry Kim MRN: [id number] DOB:July 10, 1961, 63 y.o., male Today's Date: 09/01/2024  END OF SESSION:  PT End of Session - 09/01/24 0934     Visit Number 8    Date for Recertification  09/16/24    Authorization Type [facility]    PT Start Time [phone]    PT Stop Time 1010    PT Time Calculation (min) 38 min    Activity Tolerance Patient tolerated treatment well    Behavior During Therapy [facility] for tasks assessed/performed              Past Medical History:  Diagnosis Date   Hypertension    Obesity    Persistent atrial fibrillation (HCC)    CHADS2VASC score is 1   Pure hypercholesterolemia    Supraventricular premature beats    Past Surgical History:  Procedure Laterality Date   KNEE ARTHROSCOPY WITH MEDIAL MENISECTOMY Left 07/05/2014   Procedure: LEFT ARTHROSCOPY KNEE WITH MEDIAL MENISECTOMY/DEBRIDEMENT/SHAVING (CHONDROPLASTY)/ARTHROSCOPY KNEE ABRASION ARTHROPLASTY/MULTIPLE DRILLING;  Surgeon: LELON JONETTA Shari Mickey., MD;  Location: [facility];  Service: Orthopedics;  Laterality: Left;   SHOULDER ARTHROSCOPY  2000   right   SHOULDER ARTHROSCOPY W/ ROTATOR CUFF REPAIR  2012   left   TONSILLECTOMY     Patient Active Problem List   Diagnosis Date Noted   Glucose intolerance 02/10/2023   OSA (obstructive sleep apnea) 08/30/2020   Supraventricular premature beats    Persistent atrial fibrillation (HCC)    Obesity    Pure hypercholesterolemia    Hypertension     PCP: Carlin Gull, MD   REFERRING PROVIDER: Dorn Ned, MD  REFERRING DIAG: Cervical radiculopathy on right side   THERAPY DIAG:  Cervicalgia  Radiculopathy, cervical region  Muscle weakness (generalized)  Cramp and spasm  Abnormal posture  Rationale for Evaluation and Treatment: Rehabilitation  ONSET DATE: June 24, 25  SUBJECTIVE:                                                                                                                                                                                                          SUBJECTIVE STATEMENT: Patient reports that he has not had any dizziness since Monday.  Patient denies cervical pain today.  PERTINENT HISTORY:  Right shoulder arthroscopy in 2000 Left shoulder arthroscopy with rotator cuff repair in 2012  PAIN:   Are you having pain? No NPRS scale: 0/10 Pain location: low back PAIN TYPE: aching and dull Pain description: intermittent  Aggravating factors: lifting  the couch in the house Relieving factors: stretching and rest    PRECAUTIONS: None  RED FLAGS: None     WEIGHT BEARING RESTRICTIONS: No  FALLS:  Has patient fallen in last 6 months? 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  DIAGNOSTIC FINDINGS:  Cervical MRI on 05/23/2024: IMPRESSION: 1. Cervical spondylosis and degenerative disc disease causing prominent foraminal stenosis at C3-4, C4-5, C5-6, and C6-7. Moderate right foraminal stenosis at C2-3.  PATIENT SURVEYS:  UEFS  Extreme difficulty/unable (0), Quite a bit of difficulty (1), Moderate difficulty (2), Little difficulty (3), No difficulty (4) Survey date:  07/27/24 09/01/24  Any of your usual work, household or school activities 2 3  2. Your usual hobbies, recreational/sport activities 2 2   3. Lifting a bag of groceries to waist level 3 3   4. Lifting a bag of  groceries above your head 1 2  5. Grooming your hair 3 3  6. Pushing up on your hands (I.e. from bathtub or chair) 2 2  7. Preparing food (I.e. peeling/cutting) 3 4  8. Driving  3 3  9. Vacuuming, sweeping, or raking 3 3  10. Dressing  4 4  11. Doing up buttons 3 4  12. Using tools/appliances 3 3  13. Opening doors 3 4  14. Cleaning  3 4  15. Tying or lacing shoes 3 4  16. Sleeping  2 3  17. Laundering clothes (I.e. washing, ironing, folding) 2 4  18. Opening a jar 2 4  19. Throwing a ball 3 3  20. Carrying a small suitcase with your affected limb.  2 3  Score total:  52/80 = 65% 65/80 = 81.25%            COGNITION: Overall cognitive status: Within functional limits for tasks assessed  SENSATION: Not tested  POSTURE: rounded shoulders and forward head  PALPATION: TTP right upper trap    CERVICAL ROM:   Active ROM A/ROM (deg) eval A/ROM 09/01/24  Flexion 30 deg  35  Extension 20 deg 35  Right lateral flexion 22 deg  25  Left lateral flexion 40 deg  25  Right rotation 58 deg  55  Left rotation 55 deg  60   (Blank rows = not tested)  UPPER EXTREMITY ROM:  Active ROM Right eval Left eval  Shoulder flexion 140 170  Shoulder extension    Shoulder abduction 140 180  Shoulder adduction    Shoulder extension    Shoulder internal rotation 8 55  Shoulder external rotation 60 80  Elbow flexion    Elbow extension    Wrist flexion    Wrist extension    Wrist ulnar deviation    Wrist radial deviation    Wrist pronation    Wrist supination     (Blank rows = not tested)  UPPER EXTREMITY MMT:  MMT Right eval Left eval Right 10/13 Left  10/13  Shoulder flexion  3+ 4+ 4+ 5  Shoulder extension 4+ 4+ 5 5  Shoulder abduction 4 4+ 4 5  Shoulder adduction      Shoulder internal rotation 4 4 4+ 4+  Shoulder external rotation 4- 4+ 4+ 5   (Blank rows = not tested)  CERVICAL SPECIAL TESTS:  Upper limb tension test (ULTT): Positive, Spurling's test: Positive, and  Distraction test: Positive Bakody's Sign: positive Phalen's: positive Reverse phalen's: negative    FUNCTIONAL TESTS:  Eval: 5 times sit to stand: 16.40 sec   09/01/2024: 5 times sit to/from stand:  11.97 sec  TREATMENT DATE:   09/01/2024: UBE level 1.0 x 6 min alternating direction each minute with PT present to discuss status Standing 3 way scapular stabilization with blue loop 2x6  bilat Forearms on wall with light green loop for wall walks 2x6 Standing shoulder horizontal abduction with green tband 2x10 Upper Extremity Functional Scale:  65/80 = 81.25% Measure A/ROM Seated cervical SNAGs for rotation x10 bilat Seated cervical SNAGs for extension x10 5 times sit to stand Standing lat pulldown 25# 2 x10 Standing upper trap stretch x20 sec bilat   08/29/2024: UBE level 1.0 x 6 min alternating direction each minute with PT present to discuss status MMT Standing 3 way scapular stabilization with blue loop 2x6  bilat Forearms on wall with light green loop (not band) for wall walks 1x8, 1x5 Standing shoulder horizontal abduction with green tband 2x10 (difficulty on second set) Standing ABD green 2x 10 Standing flex green 2x 10 Standing shoulder D2 with green tband x10 bilat Standing shoulder ER with green tband 2x10 Standing shoulder IR with green tband x 10, then blue x 10 B Standing lat pulldown 25# 2 x10 Seated green pball rollout x10 Seated cervical SNAGs for rotation x10 bilat Seated cervical SNAGs for extension x10 Seated in chair with head against purple ball (in pillowcase) on wall for following: Cervical retraction with bil UE ER with 2# 2x10 and then UE flex 2# 2x 10 each, then alt OH press 2# x 8 ea, 1x5.    08/23/2024: UBE level 1.0 x3 min each direction with PT present to discuss status Dix Hallpike positive on the left and proceeded with canalith repositioning with Epley Maneuver Educated patient on self epley maneuver for home use Seated green pball  rollout x10 Seated cervical SNAGs for rotation x10 bilat Seated cervical SNAGs for extension x10 Standing 3 way scapular stabilization with blue loop x5 bilat Forearms on wall with blue loop (not band) for wall walks 2x5 Standing shoulder horizontal abduction with green tband 2x10 (difficulty on second set) Standing shoulder D2 with green tband x10 bilat Standing shoulder ER with green tband 2x10 Standing lat pulldown 25# x10    PATIENT EDUCATION:  HEP update Person educated: Patient Education method: Explanation, Demonstration, Tactile cues, Verbal cues, and Handouts Education comprehension: verbalized understanding, returned demonstration, verbal cues required, tactile cues required, and needs further education  HOME EXERCISE PROGRAM: Access Code: [facility] URL: [email or web address] Date: 08/29/2024 Prepared by: Mliss  Exercises - Isometric Shoulder Flexion at Wall  - 1 x daily - 7 x weekly - 2 sets - 10 reps - Isometric Shoulder Extension at Wall  - 1 x daily - 7 x weekly - 2 sets - 10 reps - Isometric Shoulder Abduction at Wall  - 1 x daily - 7 x weekly - 2 sets - 10 reps - Standing Isometric Shoulder Internal Rotation at Doorway  - 1 x daily - 7 x weekly - 2  sets - 10 reps - Standing Isometric Shoulder External Rotation with Doorway  - 1 x daily - 7 x weekly - 2 sets - 10 reps - Seated Scapular Retraction  - 1 x daily - 7 x weekly - 2 sets - 10 reps - Standing Radial Nerve Glide (Mirrored)  - 2 x daily - 7 x weekly - 1 sets - 10 reps - 5 sec on/5 sec off hold - Median Nerve Flossing (Mirrored)  - 1 x daily - 3 x weekly - 2 sets - 10 reps - Seated Cervical Sidebending Stretch  - 2 x daily - 7 x weekly - 1 sets - 3 reps - 30 sec hold - Seated Levator Scapulae Stretch  - 2 x daily - 7 x weekly - 1 sets - 3 reps - 30 sec hold - Forearm Walks on Wall with Resistance Band  - 1 x daily - 7 x weekly - 1-2 sets - 3-5 reps - Wall Clock with Theraband (Mirrored)  - 1 x  daily - 3 x weekly - 1-3 sets - 10 reps - Self-Epley Maneuver Left Ear  - 1 x daily - 7 x weekly - 1-2 reps - Standing Shoulder Flexion with Resistance  - 1 x daily - 3 x weekly - 2 sets - 10 reps - Standing Single Arm Shoulder Abduction with Resistance  - 1 x daily - 3 x weekly - 2 sets - 10 reps - Standing Shoulder Internal Rotation with Anchored Resistance  - 1 x daily - 3 x weekly - 2 sets - 10 reps  ASSESSMENT:  CLINICAL IMPRESSION:  Mr Cipollone presents to skilled PT reporting that he is overall feeling better and that his dizziness has ceased.  Patient states that he is starting to do more yard work.  Patient has improved on his 5 times sit to stand and his Upper Extremity Functional Scale.  Patient is progressing towards goal related activities and is meeting some of his long term goals and progressing towards the others.  Patient continues to require skilled PT to progress towards goal related activities.   OBJECTIVE IMPAIRMENTS: decreased activity tolerance, decreased balance, decreased coordination, decreased endurance, decreased mobility, decreased ROM, decreased strength, hypomobility, increased fascial restrictions, increased muscle spasms, impaired flexibility, impaired sensation, impaired UE functional use, improper body mechanics, postural dysfunction, and pain.   ACTIVITY LIMITATIONS: carrying, lifting, bending, sitting, standing, sleeping, transfers, bathing, dressing, and reach over head  PARTICIPATION LIMITATIONS: meal prep, cleaning, driving, community activity, and occupation  PERSONAL FACTORS: Age, Behavior pattern, Education, Past/current experiences, Profession, and 3+ comorbidities: Hx of bilateral shoulder arthroscopy, knee arthroscopy, A-Fib are also affecting patient's functional outcome.   REHAB POTENTIAL: Good  CLINICAL DECISION MAKING: Evolving/moderate complexity  EVALUATION COMPLEXITY: Moderate   GOALS: Goals reviewed with patient? Yes  SHORT TERM  GOALS: Target date: 08/26/24  Pt will demonstrate competency with initial HEP so he is able to complete household tasks independently Baseline:  Goal status: Met on 08/23/2024  2.  Pt will demonstrate self care strategies at home so he can improve independent habits Baseline:  Goal status: Met on 08/23/2024     LONG TERM GOALS: Target date: 09/16/2024  Pt will demonstrate competency with advanced HEP to allow for self progression after discharge. Baseline:  Goal status: INITIAL  2.  Pt will increase cervical ROM to [facility] to allow him to flex neck to read charts at work Baseline: 30 deg Goal status: Ongoing (see above)  3.  Pt will increase  shoulder strength to 4+ to 5/5 bilaterally  so he can carry his work bag using the right arm Baseline:  Goal status: IN PROGRESS 10/13  4.  Pt will to report decreased cervical pain radiating down his right arm during functional tasks, such as reaching overhead to place IV back. Baseline:  Goal status: PARTIALLY MET 10/13 - not down arm, but some shoulder pain  5.  Pt will increase UEFS score to at least 75% so he is able to return to gardening fruit trees Baseline: 65% Goal status: Met on 09/01/24  6.  Pt will decrease Sit to stand time to 12 sec or less so he is increases functional capacity for going hiking Baseline: 16 sec  Goal status: Met on 09/01/24   PLAN:  PT FREQUENCY: 1-2x/week  PT DURATION: 8 weeks  PLANNED INTERVENTIONS: 97164- PT Re-evaluation, 97750- Physical Performance Testing, 97110-Therapeutic exercises, 97530- Therapeutic activity, [id number]- Neuromuscular re-education, 97535- Self Care, [id number]- Manual therapy, [id number]- Gait training, [phone]- Orthotic Initial, [phone]- Canalith repositioning, [id number]- Aquatic Therapy, H9716- Electrical stimulation (unattended), [id number]- Electrical stimulation (manual), [id number]- Vasopneumatic device, [id number]- Ultrasound, [id number]- Traction (mechanical), [id number]- Parrafin, [id number] (1-2 muscles), [id number] (3+  muscles)- Dry Needling, Patient/Family education, Balance training, Stair training, Taping, Joint mobilization, Joint manipulation, Spinal manipulation, Spinal mobilization, Vestibular training, Cryotherapy, and Moist heat  PLAN FOR NEXT SESSION: Progress OH strength as pt tolerates to allow for increased ease with OH reach at work; Cont cervical stretches and neck flexibility, shoulder mobility, nerve glides, isometrics, review supine scap series with theraband prn, and cont traction.    Jarrell Laming, PT, DPT 09/01/24, 10:36 AM  [facility] [address] [place], [place] [address] Phone # [phone] Fax [phone]

## 2024-09-05 ENCOUNTER — Ambulatory Visit: Admitting: Rehabilitative and Restorative Service Providers"

## 2024-09-05 ENCOUNTER — Encounter: Payer: Self-pay | Admitting: Rehabilitative and Restorative Service Providers"

## 2024-09-05 DIAGNOSIS — M6281 Muscle weakness (generalized): Secondary | ICD-10-CM

## 2024-09-05 DIAGNOSIS — R252 Cramp and spasm: Secondary | ICD-10-CM | POA: Diagnosis not present

## 2024-09-05 DIAGNOSIS — R293 Abnormal posture: Secondary | ICD-10-CM

## 2024-09-05 DIAGNOSIS — M542 Cervicalgia: Secondary | ICD-10-CM | POA: Diagnosis not present

## 2024-09-05 DIAGNOSIS — M5412 Radiculopathy, cervical region: Secondary | ICD-10-CM | POA: Diagnosis not present

## 2024-09-05 NOTE — Therapy (Signed)
 OUTPATIENT PHYSICAL THERAPY CERVICAL TREATMENT   Patient Name: Terry Kim MRN: [id number] DOB:Jan 27, 1961, 63 y.o., male Today's Date: 09/05/2024  END OF SESSION:  PT End of Session - 09/05/24 1018     Visit Number 9    Date for Recertification  09/16/24    Authorization Type [facility]    PT Start Time 1015    PT Stop Time 1050    PT Time Calculation (min) 35 min    Activity Tolerance Patient tolerated treatment well    Behavior During Therapy WFL for tasks assessed/performed              Past Medical History:  Diagnosis Date   Hypertension    Obesity    Persistent atrial fibrillation (HCC)    CHADS2VASC score is 1   Pure hypercholesterolemia    Supraventricular premature beats    Past Surgical History:  Procedure Laterality Date   KNEE ARTHROSCOPY WITH MEDIAL MENISECTOMY Left 07/05/2014   Procedure: LEFT ARTHROSCOPY KNEE WITH MEDIAL MENISECTOMY/DEBRIDEMENT/SHAVING (CHONDROPLASTY)/ARTHROSCOPY KNEE ABRASION ARTHROPLASTY/MULTIPLE DRILLING;  Surgeon: LELON JONETTA Shari Mickey., MD;  Location: [facility];  Service: Orthopedics;  Laterality: Left;   SHOULDER ARTHROSCOPY  2000   right   SHOULDER ARTHROSCOPY W/ ROTATOR CUFF REPAIR  2012   left   TONSILLECTOMY     Patient Active Problem List   Diagnosis Date Noted   Glucose intolerance 02/10/2023   OSA (obstructive sleep apnea) 08/30/2020   Supraventricular premature beats    Persistent atrial fibrillation (HCC)    Obesity    Pure hypercholesterolemia    Hypertension     PCP: Carlin Gull, MD   REFERRING PROVIDER: Dorn Ned, MD  REFERRING DIAG: Cervical radiculopathy on right side   THERAPY DIAG:  Cervicalgia  Radiculopathy, cervical region  Muscle weakness (generalized)  Cramp and spasm  Abnormal posture  Rationale for Evaluation and Treatment: Rehabilitation  ONSET DATE: June 24, 25  SUBJECTIVE:                                                                                                                                                                                                          SUBJECTIVE STATEMENT: Patient reports having minimal pain today, but states that he was out of town this weekend.  PERTINENT HISTORY:  Right shoulder arthroscopy in 2000 Left shoulder arthroscopy with rotator cuff repair in 2012  PAIN:   Are you having pain? No NPRS scale: 2/10 Pain location: cervical PAIN TYPE: aching and dull Pain description: intermittent  Aggravating factors: lifting the couch  in the house Relieving factors: stretching and rest    PRECAUTIONS: None  RED FLAGS: None     WEIGHT BEARING RESTRICTIONS: No  FALLS:  Has patient fallen in last 6 months? 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  DIAGNOSTIC FINDINGS:  Cervical MRI on 05/23/2024: IMPRESSION: 1. Cervical spondylosis and degenerative disc disease causing prominent foraminal stenosis at C3-4, C4-5, C5-6, and C6-7. Moderate right foraminal stenosis at C2-3.  PATIENT SURVEYS:  UEFS  Extreme difficulty/unable (0), Quite a bit of difficulty (1), Moderate difficulty (2), Little difficulty (3), No difficulty (4) Survey date:  07/27/24 09/01/24  Any of your usual work, household or school activities 2 3  2. Your usual hobbies, recreational/sport activities 2 2   3. Lifting a bag of groceries to waist level 3 3   4. Lifting a bag of groceries  above your head 1 2  5. Grooming your hair 3 3  6. Pushing up on your hands (I.e. from bathtub or chair) 2 2  7. Preparing food (I.e. peeling/cutting) 3 4  8. Driving  3 3  9. Vacuuming, sweeping, or raking 3 3  10. Dressing  4 4  11. Doing up buttons 3 4  12. Using tools/appliances 3 3  13. Opening doors 3 4  14. Cleaning  3 4  15. Tying or lacing shoes 3 4  16. Sleeping  2 3  17. Laundering clothes (I.e. washing, ironing, folding) 2 4  18. Opening a jar 2 4  19. Throwing a ball 3 3  20. Carrying a small suitcase with your affected limb.  2 3  Score total:  52/80 = 65% 65/80 = 81.25%            COGNITION: Overall cognitive status: Within functional limits for tasks assessed  SENSATION: Not tested  POSTURE: rounded shoulders and forward head  PALPATION: TTP right upper trap    CERVICAL ROM:   Active ROM A/ROM (deg) eval A/ROM 09/01/24  Flexion 30 deg  35  Extension 20 deg 35  Right lateral flexion 22 deg  25  Left lateral flexion 40 deg  25  Right rotation 58 deg  55  Left rotation 55 deg  60   (Blank rows = not tested)  UPPER EXTREMITY ROM:  Active ROM Right eval Left eval  Shoulder flexion 140 170  Shoulder extension    Shoulder abduction 140 180  Shoulder adduction    Shoulder extension    Shoulder internal rotation 8 55  Shoulder external rotation 60 80  Elbow flexion    Elbow extension    Wrist flexion    Wrist extension    Wrist ulnar deviation    Wrist radial deviation    Wrist pronation    Wrist supination     (Blank rows = not tested)  UPPER EXTREMITY MMT:  MMT Right eval Left eval Right 10/13 Left  10/13  Shoulder flexion 3+ 4+  4+ 5  Shoulder extension 4+ 4+ 5 5  Shoulder abduction 4 4+ 4 5  Shoulder adduction      Shoulder internal rotation 4 4 4+ 4+  Shoulder external rotation 4- 4+ 4+ 5   (Blank rows = not tested)  CERVICAL SPECIAL TESTS:  Upper limb tension test (ULTT): Positive, Spurling's test: Positive, and Distraction  test: Positive Bakody's Sign: positive Phalen's: positive Reverse phalen's: negative    FUNCTIONAL TESTS:  Eval: 5 times sit to stand: 16.40 sec   09/01/2024: 5 times sit to/from stand:  11.97 sec  TREATMENT DATE:   09/05/2024: UBE level 1.2 x 6 min alternating direction each minute with PT present to discuss status Standing 3 way scapular stabilization with blue loop 2x6  bilat Forearms on wall with light green loop for wall walks 2x6 Standing shoulder horizontal abduction with green tband 2x10 Standing shoulder ER with green tband 2x10 Standing shoulder D2 with green tband 2x10 bilat Standing lat pulldown 35# 2 x10 Standing rows with 15# 2x10 Standing shoulder chops 10# x10 bilat   09/01/2024: UBE level 1.0 x 6 min alternating direction each minute with PT present to discuss status Standing 3 way scapular stabilization with blue loop 2x6  bilat Forearms on wall with light green loop for wall walks 2x6 Standing shoulder horizontal abduction with green tband 2x10 Upper Extremity Functional Scale:  65/80 = 81.25% Measure A/ROM Seated cervical SNAGs for rotation x10 bilat Seated cervical SNAGs for extension x10 5 times sit to stand Standing lat pulldown 25# 2 x10 Standing upper trap stretch x20 sec bilat   08/29/2024: UBE level 1.0 x 6 min alternating direction each minute with PT present to discuss status MMT Standing 3 way scapular stabilization with blue loop 2x6  bilat Forearms on wall with light green loop (not band) for wall walks 1x8, 1x5 Standing shoulder horizontal abduction with green tband 2x10 (difficulty on second set) Standing ABD green 2x 10 Standing flex green 2x 10 Standing shoulder D2 with green tband x10 bilat Standing shoulder ER with green tband 2x10 Standing shoulder IR with green tband x 10, then blue x 10 B Standing lat pulldown 25# 2 x10 Seated green pball rollout x10 Seated cervical SNAGs for rotation x10 bilat Seated cervical SNAGs for  extension x10 Seated in chair with head against purple ball (in pillowcase) on wall for following: Cervical retraction with bil UE ER with 2# 2x10 and then UE flex 2# 2x 10 each, then alt OH press 2# x 8 ea, 1x5.     PATIENT EDUCATION:  HEP update Person educated: Patient Education method: Explanation, Demonstration, Tactile cues, Verbal cues, and Handouts Education comprehension: verbalized understanding, returned demonstration, verbal cues required, tactile cues required, and needs further education  HOME EXERCISE PROGRAM: Access Code: [facility] URL: [email or web address] Date: 08/29/2024 Prepared by: Mliss  Exercises - Isometric Shoulder Flexion at Wall  - 1 x daily - 7 x weekly - 2 sets - 10 reps - Isometric Shoulder Extension at Wall  - 1 x daily - 7 x weekly - 2 sets - 10 reps - Isometric Shoulder Abduction at Wall  - 1 x daily - 7 x weekly - 2 sets - 10 reps - Standing Isometric Shoulder Internal Rotation at Doorway  - 1 x daily - 7 x weekly - 2 sets - 10 reps - Standing Isometric Shoulder External Rotation with Doorway  - 1 x daily - 7 x weekly - 2 sets - 10 reps - Seated Scapular Retraction  -  1 x daily - 7 x weekly - 2 sets - 10 reps - Standing Radial Nerve Glide (Mirrored)  - 2 x daily - 7 x weekly - 1 sets - 10 reps - 5 sec on/5 sec off hold - Median Nerve Flossing (Mirrored)  - 1 x daily - 3 x weekly - 2 sets - 10 reps - Seated Cervical Sidebending Stretch  - 2 x daily - 7 x weekly - 1 sets - 3 reps - 30 sec hold - Seated Levator Scapulae Stretch  - 2 x daily - 7 x weekly - 1 sets - 3 reps - 30 sec hold - Forearm Walks on Wall with Resistance Band  - 1 x daily - 7 x weekly - 1-2 sets - 3-5 reps - Wall Clock with Theraband (Mirrored)  - 1 x daily - 3 x weekly - 1-3 sets - 10 reps - Self-Epley Maneuver Left Ear  - 1 x daily - 7 x weekly - 1-2 reps - Standing Shoulder Flexion with Resistance  - 1 x daily - 3 x weekly - 2 sets - 10 reps - Standing Single Arm  Shoulder Abduction with Resistance  - 1 x daily - 3 x weekly - 2 sets - 10 reps - Standing Shoulder Internal Rotation with Anchored Resistance  - 1 x daily - 3 x weekly - 2 sets - 10 reps  ASSESSMENT:  CLINICAL IMPRESSION:  Mr Schreur presents to skilled PT reporting that he is having slight soreness after being out of town.  Patient was able to progress during visit to increased exercises using weight machines.  Patient continues to report compliance with HEP.  Patient was able to perform new exercises with proper form.  Patient is progressing appropriately towards goals and requiring less cuing for posture during exercises.   OBJECTIVE IMPAIRMENTS: decreased activity tolerance, decreased balance, decreased coordination, decreased endurance, decreased mobility, decreased ROM, decreased strength, hypomobility, increased fascial restrictions, increased muscle spasms, impaired flexibility, impaired sensation, impaired UE functional use, improper body mechanics, postural dysfunction, and pain.   ACTIVITY LIMITATIONS: carrying, lifting, bending, sitting, standing, sleeping, transfers, bathing, dressing, and reach over head  PARTICIPATION LIMITATIONS: meal prep, cleaning, driving, community activity, and occupation  PERSONAL FACTORS: Age, Behavior pattern, Education, Past/current experiences, Profession, and 3+ comorbidities: Hx of bilateral shoulder arthroscopy, knee arthroscopy, A-Fib are also affecting patient's functional outcome.   REHAB POTENTIAL: Good  CLINICAL DECISION MAKING: Evolving/moderate complexity  EVALUATION COMPLEXITY: Moderate   GOALS: Goals reviewed with patient? Yes  SHORT TERM GOALS: Target date: 08/26/24  Pt will demonstrate competency with initial HEP so he is able to complete household tasks independently Baseline:  Goal status: Met on 08/23/2024  2.  Pt will demonstrate self care strategies at home so he can improve independent habits Baseline:  Goal status: Met  on 08/23/2024     LONG TERM GOALS: Target date: 09/16/2024  Pt will demonstrate competency with advanced HEP to allow for self progression after discharge. Baseline:  Goal status: Ongoing  2.  Pt will increase cervical ROM to [facility] to allow him to flex neck to read charts at work Baseline: 30 deg Goal status: Ongoing (see above)  3.  Pt will increase shoulder strength to 4+ to 5/5 bilaterally  so he can carry his work bag using the right arm Baseline:  Goal status: IN PROGRESS 10/13  4.  Pt will to report decreased cervical pain radiating down his right arm during functional tasks, such as reaching overhead to place  IV back. Baseline:  Goal status: PARTIALLY MET 10/13 - not down arm, but some shoulder pain  5.  Pt will increase UEFS score to at least 75% so he is able to return to gardening fruit trees Baseline: 65% Goal status: Met on 09/01/24  6.  Pt will decrease Sit to stand time to 12 sec or less so he is increases functional capacity for going hiking Baseline: 16 sec  Goal status: Met on 09/01/24   PLAN:  PT FREQUENCY: 1-2x/week  PT DURATION: 8 weeks  PLANNED INTERVENTIONS: [id number]- PT Re-evaluation, 97750- Physical Performance Testing, 97110-Therapeutic exercises, 97530- Therapeutic activity, [id number]- Neuromuscular re-education, 97535- Self Care, [id number]- Manual therapy, [id number]- Gait training, [phone]- Orthotic Initial, [phone]- Canalith repositioning, [id number]- Aquatic Therapy, [id number]- Electrical stimulation (unattended), [id number]- Electrical stimulation (manual), [id number]- Vasopneumatic device, [id number]- Ultrasound, [id number]- Traction (mechanical), [id number]- Parrafin, [id number] (1-2 muscles), [id number] (3+ muscles)- Dry Needling, Patient/Family education, Balance training, Stair training, Taping, Joint mobilization, Joint manipulation, Spinal manipulation, Spinal mobilization, Vestibular training, Cryotherapy, and Moist heat  PLAN FOR NEXT SESSION: Progress OH strength as pt tolerates to allow for increased  ease with OH reach at work; Cont cervical stretches and neck flexibility, shoulder mobility, nerve glides, isometrics, review supine scap series with theraband prn    Jarrell Laming, PT, DPT 09/05/24, 1:01 PM  [facility] [address] [place], [place] [address] Phone # [phone] Fax [phone]

## 2024-09-06 NOTE — Therapy (Signed)
 OUTPATIENT PHYSICAL THERAPY CERVICAL TREATMENT   Patient Name: Terry Kim MRN: [id number] DOB:10-03-1961, 63 y.o., male Today's Date: 09/07/2024  END OF SESSION:  PT End of Session - 09/07/24 1011     Visit Number 10    Date for Recertification  09/16/24    Authorization Type [facility]    PT Start Time 1015    PT Stop Time 1058    PT Time Calculation (min) 43 min    Activity Tolerance Patient tolerated treatment well    Behavior During Therapy WFL for tasks assessed/performed               Past Medical History:  Diagnosis Date   Hypertension    Obesity    Persistent atrial fibrillation (HCC)    CHADS2VASC score is 1   Pure hypercholesterolemia    Supraventricular premature beats    Past Surgical History:  Procedure Laterality Date   KNEE ARTHROSCOPY WITH MEDIAL MENISECTOMY Left 07/05/2014   Procedure: LEFT ARTHROSCOPY KNEE WITH MEDIAL MENISECTOMY/DEBRIDEMENT/SHAVING (CHONDROPLASTY)/ARTHROSCOPY KNEE ABRASION ARTHROPLASTY/MULTIPLE DRILLING;  Surgeon: LELON JONETTA Shari Mickey., MD;  Location: [facility];  Service: Orthopedics;  Laterality: Left;   SHOULDER ARTHROSCOPY  2000   right   SHOULDER ARTHROSCOPY W/ ROTATOR CUFF REPAIR  2012   left   TONSILLECTOMY     Patient Active Problem List   Diagnosis Date Noted   Glucose intolerance 02/10/2023   OSA (obstructive sleep apnea) 08/30/2020   Supraventricular premature beats    Persistent atrial fibrillation (HCC)    Obesity    Pure hypercholesterolemia    Hypertension     PCP: Carlin Gull, MD   REFERRING PROVIDER: Dorn Ned, MD  REFERRING DIAG: Cervical radiculopathy on right side   THERAPY DIAG:  Cervicalgia  Radiculopathy, cervical region  Muscle weakness (generalized)  Abnormal posture  Rationale for Evaluation and Treatment: Rehabilitation  ONSET DATE: June 24, 25  SUBJECTIVE:                                                                                                                                                                                                          SUBJECTIVE STATEMENT: I did a lot of stuff yesterday, so I'm feeling achy. My neck is a little stiff this morning. Used wall cart to bring in 40# bags of pellets   PERTINENT HISTORY:  Right shoulder arthroscopy in 2000 Left shoulder arthroscopy with rotator cuff repair in 2012  PAIN:   Are you having pain? No NPRS scale: 3-4/10 Pain location: cervical R PAIN TYPE:  aching and dull Pain description: intermittent  Aggravating factors: lifting the couch in the house Relieving factors: stretching and rest    PRECAUTIONS: None  RED FLAGS: None     WEIGHT BEARING RESTRICTIONS: No  FALLS:  Has patient fallen in last 6 months? 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  DIAGNOSTIC FINDINGS:  Cervical MRI on 05/23/2024: IMPRESSION: 1. Cervical spondylosis and degenerative disc disease causing prominent foraminal stenosis at C3-4, C4-5, C5-6, and C6-7. Moderate right foraminal stenosis at C2-3.  PATIENT SURVEYS:  UEFS  Extreme difficulty/unable (0), Quite a bit of difficulty (1), Moderate difficulty (2), Little difficulty (3), No difficulty (4) Survey date:  07/27/24 09/01/24  Any of your usual work, household or school activities 2 3  2. Your usual hobbies, recreational/sport activities 2 2   3. Lifting a bag of groceries to waist level 3  3   4. Lifting a bag of groceries above your head 1 2  5. Grooming your hair 3 3  6. Pushing up on your hands (I.e. from bathtub or chair) 2 2  7. Preparing food (I.e. peeling/cutting) 3 4  8. Driving  3 3  9. Vacuuming, sweeping, or raking 3 3  10. Dressing  4 4  11. Doing up buttons 3 4  12. Using tools/appliances 3 3  13. Opening doors 3 4  14. Cleaning  3 4  15. Tying or lacing shoes 3 4  16. Sleeping  2 3  17. Laundering clothes (I.e. washing, ironing, folding) 2 4  18. Opening a jar 2 4  19. Throwing a ball 3 3  20. Carrying a small suitcase with your affected limb.  2 3  Score total:  52/80 = 65% 65/80 = 81.25%            COGNITION: Overall cognitive status: Within functional limits for tasks assessed  SENSATION: Not tested  POSTURE: rounded shoulders and forward head  PALPATION: TTP right upper trap    CERVICAL ROM:   Active ROM A/ROM (deg) eval A/ROM 09/01/24  Flexion 30 deg  35  Extension 20 deg 35  Right lateral flexion 22 deg  25  Left lateral flexion 40 deg  25  Right rotation 58 deg  55  Left rotation 55 deg  60   (Blank rows = not tested)  UPPER EXTREMITY ROM:  Active ROM Right eval Left eval  Shoulder flexion 140 170  Shoulder extension    Shoulder abduction 140 180  Shoulder adduction    Shoulder extension    Shoulder internal rotation 8 55  Shoulder external rotation 60 80  Elbow flexion    Elbow extension    Wrist flexion    Wrist extension    Wrist ulnar deviation    Wrist radial deviation    Wrist pronation    Wrist supination     (Blank rows = not tested)  UPPER EXTREMITY MMT:  MMT Right eval  Left eval Right 10/13 Left  10/13  Shoulder flexion 3+ 4+ 4+ 5  Shoulder extension 4+ 4+ 5 5  Shoulder abduction 4 4+ 4 5  Shoulder adduction      Shoulder internal rotation 4 4 4+ 4+  Shoulder external rotation 4- 4+ 4+ 5   (Blank rows = not tested)  CERVICAL SPECIAL TESTS:  Upper limb tension test (ULTT): Positive,  Spurling's test: Positive, and Distraction test: Positive Bakody's Sign: positive Phalen's: positive Reverse phalen's: negative    FUNCTIONAL TESTS:  Eval: 5 times sit to stand: 16.40 sec   09/01/2024: 5 times sit to/from stand:  11.97 sec  TREATMENT DATE:   09/07/2024: UBE level 2 x 6 min alternating direction each minute with PT present to discuss status Standing 3 way scapular stabilization with blue loop 2x8  bilat Forearms on wall with light green loop for wall walks 2x8 Seated UT stretch x 30 sec B Standing shoulder horizontal abduction with green tband 2x10 Standing shoulder ER with green tband 2x10 unilaterally B Standing shoulder D2 with green tband 2x10  Standing lat pulldown 40# 2 x10 - reports some pain at end of second set Standing rows with 15# 2x10 ea at level 10 and level 4 Standing single arm shoulder chops 10# x10 bilat SL ER 2# x 10, then with 3 sec hold and slow return x 10 (challenging)  OH press 1-2 # x 10 challenging 90 deg ER stretch in doorway 2x30 sec   09/05/2024: UBE level 1.2 x 6 min alternating direction each minute with PT present to discuss status Standing 3 way scapular stabilization with blue loop 2x6  bilat Forearms on wall with light green loop for wall walks 2x6 Standing shoulder horizontal abduction with green tband 2x10 Standing shoulder ER with green tband 2x10 Standing shoulder D2 with green tband 2x10 bilat Standing lat pulldown 35# 2 x10 Standing rows with 15# 2x10 Standing shoulder chops 10# x10 bilat   09/01/2024: UBE level 1.0 x 6 min alternating direction each minute with PT present to discuss status Standing 3 way scapular stabilization with blue loop 2x6  bilat Forearms on wall with light green loop for wall walks 2x6 Standing shoulder horizontal abduction with green tband 2x10 Upper Extremity Functional Scale:  65/80 = 81.25% Measure A/ROM Seated cervical SNAGs for rotation x10 bilat Seated cervical SNAGs for  extension x10 5 times sit to stand Standing lat pulldown 25# 2 x10 Standing upper trap stretch x20 sec bilat   08/29/2024: UBE level 1.0 x 6 min alternating direction each minute with PT present to discuss status MMT Standing 3 way scapular stabilization with blue loop 2x6  bilat Forearms on wall with light green loop (not band) for wall walks 1x8, 1x5 Standing shoulder horizontal abduction with green tband 2x10 (difficulty on second set) Standing ABD green 2x 10 Standing flex green 2x 10 Standing shoulder D2 with green tband x10 bilat Standing shoulder ER with green tband 2x10 Standing shoulder IR with green tband x 10, then blue x 10 B Standing lat pulldown 25# 2 x10 Seated green pball rollout x10 Seated cervical SNAGs for rotation x10 bilat Seated cervical SNAGs for extension x10 Seated in chair with head against purple ball (in pillowcase) on wall for following: Cervical retraction with bil UE ER with 2# 2x10 and then UE flex 2# 2x 10 each, then alt OH press 2# x 8 ea, 1x5.     PATIENT EDUCATION:  HEP update Person educated: Patient Education method: Explanation, Demonstration,  Tactile cues, Verbal cues, and Handouts Education comprehension: verbalized understanding, returned demonstration, verbal cues required, tactile cues required, and needs further education  HOME EXERCISE PROGRAM: Access Code: [facility] URL: [email or web address] Date: 08/29/2024 Prepared by: Mliss  Exercises - Isometric Shoulder Flexion at Wall  - 1 x daily - 7 x weekly - 2 sets - 10 reps - Isometric Shoulder Extension at Wall  - 1 x daily - 7 x weekly - 2 sets - 10 reps - Isometric Shoulder Abduction at Wall  - 1 x daily - 7 x weekly - 2 sets - 10 reps - Standing Isometric Shoulder Internal Rotation at Doorway  - 1 x daily - 7 x weekly - 2 sets - 10 reps - Standing Isometric Shoulder External Rotation with Doorway  - 1 x daily - 7 x weekly - 2 sets - 10 reps - Seated Scapular  Retraction  - 1 x daily - 7 x weekly - 2 sets - 10 reps - Standing Radial Nerve Glide (Mirrored)  - 2 x daily - 7 x weekly - 1 sets - 10 reps - 5 sec on/5 sec off hold - Median Nerve Flossing (Mirrored)  - 1 x daily - 3 x weekly - 2 sets - 10 reps - Seated Cervical Sidebending Stretch  - 2 x daily - 7 x weekly - 1 sets - 3 reps - 30 sec hold - Seated Levator Scapulae Stretch  - 2 x daily - 7 x weekly - 1 sets - 3 reps - 30 sec hold - Forearm Walks on Wall with Resistance Band  - 1 x daily - 7 x weekly - 1-2 sets - 3-5 reps - Wall Clock with Theraband (Mirrored)  - 1 x daily - 3 x weekly - 1-3 sets - 10 reps - Self-Epley Maneuver Left Ear  - 1 x daily - 7 x weekly - 1-2 reps - Standing Shoulder Flexion with Resistance  - 1 x daily - 3 x weekly - 2 sets - 10 reps - Standing Single Arm Shoulder Abduction with Resistance  - 1 x daily - 3 x weekly - 2 sets - 10 reps - Standing Shoulder Internal Rotation with Anchored Resistance  - 1 x daily - 3 x weekly - 2 sets - 10 reps  ASSESSMENT:  CLINICAL IMPRESSION: Good tolerance to exercise today. Some pain in the shoulder at end of lat pulls. He is tight in R shoulder ER and felt a really good stretch at 90 deg in doorway. He was also challenged with SL ER with holds. Patient nearing end of his POC, but will likely need to be extended to meet strength goals.    OBJECTIVE IMPAIRMENTS: decreased activity tolerance, decreased balance, decreased coordination, decreased endurance, decreased mobility, decreased ROM, decreased strength, hypomobility, increased fascial restrictions, increased muscle spasms, impaired flexibility, impaired sensation, impaired UE functional use, improper body mechanics, postural dysfunction, and pain.   ACTIVITY LIMITATIONS: carrying, lifting, bending, sitting, standing, sleeping, transfers, bathing, dressing, and reach over head  PARTICIPATION LIMITATIONS: meal prep, cleaning, driving, community activity, and occupation  PERSONAL  FACTORS: Age, Behavior pattern, Education, Past/current experiences, Profession, and 3+ comorbidities: Hx of bilateral shoulder arthroscopy, knee arthroscopy, A-Fib are also affecting patient's functional outcome.   REHAB POTENTIAL: Good  CLINICAL DECISION MAKING: Evolving/moderate complexity  EVALUATION COMPLEXITY: Moderate   GOALS: Goals reviewed with patient? Yes  SHORT TERM GOALS: Target date: 08/26/24  Pt will demonstrate competency with initial HEP so he is able to complete household tasks independently  Baseline:  Goal status: Met on 08/23/2024  2.  Pt will demonstrate self care strategies at home so he can improve independent habits Baseline:  Goal status: Met on 08/23/2024     LONG TERM GOALS: Target date: 09/16/2024  Pt will demonstrate competency with advanced HEP to allow for self progression after discharge. Baseline:  Goal status: Ongoing  2.  Pt will increase cervical ROM to [facility] to allow him to flex neck to read charts at work Baseline: 30 deg Goal status: Ongoing (see above)  3.  Pt will increase shoulder strength to 4+ to 5/5 bilaterally  so he can carry his work bag using the right arm Baseline:  Goal status: IN PROGRESS 10/13  4.  Pt will to report decreased cervical pain radiating down his right arm during functional tasks, such as reaching overhead to place IV back. Baseline:  Goal status: PARTIALLY MET 10/13 - not down arm, but some shoulder pain  5.  Pt will increase UEFS score to at least 75% so he is able to return to gardening fruit trees Baseline: 65% Goal status: Met on 09/01/24  6.  Pt will decrease Sit to stand time to 12 sec or less so he is increases functional capacity for going hiking Baseline: 16 sec  Goal status: Met on 09/01/24   PLAN:  PT FREQUENCY: 1-2x/week  PT DURATION: 8 weeks  PLANNED INTERVENTIONS: 97164- PT Re-evaluation, 97750- Physical Performance Testing, 97110-Therapeutic exercises, 97530- Therapeutic activity,  [id number]- Neuromuscular re-education, 97535- Self Care, [id number]- Manual therapy, [id number]- Gait training, [phone]- Orthotic Initial, [phone]- Canalith repositioning, [id number]- Aquatic Therapy, [id number]- Electrical stimulation (unattended), [id number]- Electrical stimulation (manual), [id number]- Vasopneumatic device, [id number]- Ultrasound, [id number]- Traction (mechanical), [id number]- Parrafin, [id number] (1-2 muscles), [id number] (3+ muscles)- Dry Needling, Patient/Family education, Balance training, Stair training, Taping, Joint mobilization, Joint manipulation, Spinal manipulation, Spinal mobilization, Vestibular training, Cryotherapy, and Moist heat  PLAN FOR NEXT SESSION: Progress OH strength as pt tolerates to allow for increased ease with OH reach at work; Cont cervical stretches and neck flexibility, shoulder mobility, nerve glides, isometrics, review supine scap series with theraband prn    Mliss Cummins, PT  09/07/24, 11:00 AM  [facility] [address] [place], [place] [address] Phone # [phone] Fax [phone]

## 2024-09-07 ENCOUNTER — Ambulatory Visit: Admitting: Physical Therapy

## 2024-09-07 ENCOUNTER — Encounter: Payer: Self-pay | Admitting: Physical Therapy

## 2024-09-07 DIAGNOSIS — R293 Abnormal posture: Secondary | ICD-10-CM

## 2024-09-07 DIAGNOSIS — R252 Cramp and spasm: Secondary | ICD-10-CM | POA: Diagnosis not present

## 2024-09-07 DIAGNOSIS — M6281 Muscle weakness (generalized): Secondary | ICD-10-CM

## 2024-09-07 DIAGNOSIS — M542 Cervicalgia: Secondary | ICD-10-CM

## 2024-09-07 DIAGNOSIS — M5412 Radiculopathy, cervical region: Secondary | ICD-10-CM | POA: Diagnosis not present

## 2024-09-12 ENCOUNTER — Ambulatory Visit: Admitting: Rehabilitative and Restorative Service Providers"

## 2024-09-12 ENCOUNTER — Encounter: Payer: Self-pay | Admitting: Rehabilitative and Restorative Service Providers"

## 2024-09-12 DIAGNOSIS — M5412 Radiculopathy, cervical region: Secondary | ICD-10-CM

## 2024-09-12 DIAGNOSIS — R293 Abnormal posture: Secondary | ICD-10-CM | POA: Diagnosis not present

## 2024-09-12 DIAGNOSIS — M542 Cervicalgia: Secondary | ICD-10-CM

## 2024-09-12 DIAGNOSIS — M6281 Muscle weakness (generalized): Secondary | ICD-10-CM

## 2024-09-12 DIAGNOSIS — R252 Cramp and spasm: Secondary | ICD-10-CM

## 2024-09-12 NOTE — Therapy (Signed)
 OUTPATIENT PHYSICAL THERAPY TREATMENT NOTE AND REASSESSMENT VISIT   Patient Name: Terry Kim MRN: [id number] DOB:09-06-1961, 63 y.o., male Today's Date: 09/12/2024  END OF SESSION:  PT End of Session - 09/12/24 0927     Visit Number 11    Date for Recertification  11/11/24    Authorization Type [facility]    PT Start Time [phone]    PT Stop Time 1005    PT Time Calculation (min) 40 min    Activity Tolerance Patient tolerated treatment well    Behavior During Therapy [facility] for tasks assessed/performed               Past Medical History:  Diagnosis Date   Hypertension    Obesity    Persistent atrial fibrillation (HCC)    CHADS2VASC score is 1   Pure hypercholesterolemia    Supraventricular premature beats    Past Surgical History:  Procedure Laterality Date   KNEE ARTHROSCOPY WITH MEDIAL MENISECTOMY Left 07/05/2014   Procedure: LEFT ARTHROSCOPY KNEE WITH MEDIAL MENISECTOMY/DEBRIDEMENT/SHAVING (CHONDROPLASTY)/ARTHROSCOPY KNEE ABRASION ARTHROPLASTY/MULTIPLE DRILLING;  Surgeon: LELON JONETTA Shari Mickey., MD;  Location:  [facility];  Service: Orthopedics;  Laterality: Left;   SHOULDER ARTHROSCOPY  2000   right   SHOULDER ARTHROSCOPY W/ ROTATOR CUFF REPAIR  2012   left   TONSILLECTOMY     Patient Active Problem List   Diagnosis Date Noted   Glucose intolerance 02/10/2023   OSA (obstructive sleep apnea) 08/30/2020   Supraventricular premature beats    Persistent atrial fibrillation (HCC)    Obesity    Pure hypercholesterolemia    Hypertension     PCP: Carlin Gull, MD   REFERRING PROVIDER: Dorn Ned, MD  REFERRING DIAG: Cervical radiculopathy on right side   THERAPY DIAG:  Cervicalgia - Plan: PT plan of care cert/re-cert  Radiculopathy, cervical region - Plan: PT plan of care cert/re-cert  Muscle weakness (generalized) - Plan: PT plan of care cert/re-cert  Abnormal posture - Plan: PT plan of care cert/re-cert  Cramp and spasm - Plan: PT  plan of care cert/re-cert  Rationale for Evaluation and Treatment: Rehabilitation  ONSET DATE: June 24, 25  SUBJECTIVE:                                                                                                                                                                                                         SUBJECTIVE STATEMENT: Pt reports that he was able to lift IV bags onto the poles over the weekend, states that he does okay with one bag,  but minimal difficulty if the has several to lift.  PERTINENT HISTORY:  Right shoulder arthroscopy in 2000 Left shoulder arthroscopy with rotator cuff repair in 2012  PAIN:   Are you having pain? Yes NPRS scale: 1/10 Pain location: cervical R PAIN TYPE: aching and dull Pain description: intermittent  Aggravating factors: lifting the couch in the house Relieving factors: stretching and rest    PRECAUTIONS: None  RED FLAGS: None     WEIGHT BEARING RESTRICTIONS: No  FALLS:  Has patient fallen in last 6 months? 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  DIAGNOSTIC FINDINGS:  Cervical MRI on 05/23/2024: IMPRESSION: 1. Cervical spondylosis and degenerative disc disease causing prominent foraminal stenosis at C3-4, C4-5, C5-6, and C6-7. Moderate right foraminal stenosis at C2-3.  PATIENT SURVEYS:  UEFS  Extreme difficulty/unable (0), Quite a bit of difficulty (1), Moderate  difficulty (2), Little difficulty (3), No difficulty (4) Survey date:  07/27/24 09/01/24  Any of your usual work, household or school activities 2 3  2. Your usual hobbies, recreational/sport activities 2 2   3. Lifting a bag of groceries to waist level 3 3   4. Lifting a bag of groceries above your head 1 2  5. Grooming your hair 3 3  6. Pushing up on your hands (I.e. from bathtub or chair) 2 2  7. Preparing food (I.e. peeling/cutting) 3 4  8. Driving  3 3  9. Vacuuming, sweeping, or raking 3 3  10. Dressing  4 4  11. Doing up buttons 3 4  12. Using tools/appliances 3 3  13. Opening doors 3 4  14. Cleaning  3 4  15. Tying or lacing shoes 3 4  16. Sleeping  2 3  17. Laundering clothes (I.e. washing, ironing, folding) 2 4  18. Opening a jar 2 4  19. Throwing a ball 3 3  20. Carrying a small suitcase with your affected limb.  2 3  Score total:  52/80 = 65% 65/80 = 81.25%            COGNITION: Overall cognitive status: Within functional limits for tasks assessed  SENSATION: Not tested  POSTURE: rounded shoulders and forward head  PALPATION: TTP right upper trap    CERVICAL ROM:   Active ROM A/ROM (deg) eval A/ROM 09/01/24  Flexion 30 deg  35  Extension 20 deg 35  Right lateral flexion 22 deg  25  Left lateral flexion 40 deg  25  Right rotation 58 deg  55  Left rotation 55 deg  60   (Blank rows = not tested)  UPPER EXTREMITY ROM:  Active ROM Right eval Right 09/12/24 Left eval  Shoulder flexion 140 160 170  Shoulder extension     Shoulder abduction 140 165 180  Shoulder adduction     Shoulder extension     Shoulder internal rotation 8 50 55  Shoulder external rotation 60 60 80  Elbow flexion  Elbow extension     Wrist flexion     Wrist extension     Wrist ulnar deviation     Wrist radial deviation     Wrist pronation     Wrist supination      (Blank rows = not tested)  UPPER EXTREMITY MMT:  MMT Right eval Left eval Right 10/13 Left  10/13   Shoulder flexion 3+ 4+ 4+ 5  Shoulder extension 4+ 4+ 5 5  Shoulder abduction 4 4+ 4 5  Shoulder adduction      Shoulder internal rotation 4 4 4+ 4+  Shoulder external rotation 4- 4+ 4+ 5   (Blank rows = not tested)  09/12/2024: Right grip strength:  47 pounds Left grip strength:  75 pounds  CERVICAL SPECIAL TESTS:  Upper limb tension test (ULTT): Positive, Spurling's test: Positive, and Distraction test: Positive Bakody's Sign: positive Phalen's: positive Reverse phalen's: negative    FUNCTIONAL TESTS:  Eval: 5 times sit to stand: 16.40 sec   09/01/2024: 5 times sit to/from stand:  11.97 sec  TREATMENT DATE:   09/12/2024: UBE level 2 x 6 min alternating direction each minute with PT present to discuss status Supine shoulder A/ROM measurements Supine shoulder flexion with 3# dumbbell x15 right UE Left sidelying right shoulder ER with 3# dumbbell 2x10 (with 3 sec hold) Seated shoulder overhead press with 2# dumbbell 2x7 on right side, 2x10 on left Seated shoulder abduction with 2# dumbbell  Grip strength Seated grip strength with red medium putty x1 min Seated grip strengthening with red flexbar Standing lat pulldown 35# 2 x10 Standing rows with 15# 2x10   09/07/2024: UBE level 2 x 6 min alternating direction each minute with PT present to discuss status Standing 3 way scapular stabilization with blue loop 2x8  bilat Forearms on wall with light green loop for wall walks 2x8 Seated UT stretch x 30 sec B Standing shoulder horizontal abduction with green tband 2x10 Standing shoulder ER with green tband 2x10 unilaterally B Standing shoulder D2 with green tband 2x10  Standing lat pulldown 40# 2 x10 - reports some pain at end of second set Standing rows with 15# 2x10 ea at level 10 and level 4 Standing single arm shoulder chops 10# x10 bilat SL ER 2# x 10, then with 3 sec hold and slow return x 10 (challenging)  OH press 1-2 # x 10 challenging 90 deg ER stretch in  doorway 2x30 sec   09/05/2024: UBE level 1.2 x 6 min alternating direction each minute with PT present to discuss status Standing 3 way scapular stabilization with blue loop 2x6  bilat Forearms on wall with light green loop for wall walks 2x6 Standing shoulder horizontal abduction with green tband 2x10 Standing shoulder ER with green tband 2x10 Standing shoulder D2 with green tband 2x10 bilat Standing lat pulldown 35# 2 x10 Standing rows with 15# 2x10 Standing shoulder chops 10# x10 bilat    PATIENT EDUCATION:  HEP update Person educated: Patient Education method: Explanation, Demonstration, Tactile cues, Verbal cues, and Handouts Education comprehension: verbalized understanding, returned demonstration, verbal cues required, tactile cues required, and needs further education  HOME EXERCISE PROGRAM: Access Code: [email or web address] URL: [email or web address] Date: 08/29/2024 Prepared by: Mliss  Exercises - Isometric Shoulder Flexion at Wall  - 1 x daily - 7 x weekly - 2 sets - 10 reps - Isometric Shoulder Extension at Wall  - 1 x daily - 7 x weekly - 2 sets - 10 reps - Isometric Shoulder Abduction  at Wall  - 1 x daily - 7 x weekly - 2 sets - 10 reps - Standing Isometric Shoulder Internal Rotation at Doorway  - 1 x daily - 7 x weekly - 2 sets - 10 reps - Standing Isometric Shoulder External Rotation with Doorway  - 1 x daily - 7 x weekly - 2 sets - 10 reps - Seated Scapular Retraction  - 1 x daily - 7 x weekly - 2 sets - 10 reps - Standing Radial Nerve Glide (Mirrored)  - 2 x daily - 7 x weekly - 1 sets - 10 reps - 5 sec on/5 sec off hold - Median Nerve Flossing (Mirrored)  - 1 x daily - 3 x weekly - 2 sets - 10 reps - Seated Cervical Sidebending Stretch  - 2 x daily - 7 x weekly - 1 sets - 3 reps - 30 sec hold - Seated Levator Scapulae Stretch  - 2 x daily - 7 x weekly - 1 sets - 3 reps - 30 sec hold - Forearm Walks on Wall with Resistance Band  - 1 x daily - 7 x weekly -  1-2 sets - 3-5 reps - Wall Clock with Theraband (Mirrored)  - 1 x daily - 3 x weekly - 1-3 sets - 10 reps - Self-Epley Maneuver Left Ear  - 1 x daily - 7 x weekly - 1-2 reps - Standing Shoulder Flexion with Resistance  - 1 x daily - 3 x weekly - 2 sets - 10 reps - Standing Single Arm Shoulder Abduction with Resistance  - 1 x daily - 3 x weekly - 2 sets - 10 reps - Standing Shoulder Internal Rotation with Anchored Resistance  - 1 x daily - 3 x weekly - 2 sets - 10 reps  ASSESSMENT:  CLINICAL IMPRESSION:  Jerol presents to skilled PT reporting that he was better able to lift the IV bags, but still had some difficulty with it.  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  Patient continues with difficulty with overhead strengthening and shoulder ER tasks, but he has improved his right shoulder A/ROM in supine and it is now closer to his left arm.  Able to assess grip strength today and patient was noted to have significantly less strength on his right hand, so provided him with the medium theraputty to assist him at home with grip strengthening.  Patient continues to have some impairments and has not met all of the functional long term goals, especially with work related tasks and would benefit from continued PT of 1x/week for 8 more weeks to further progress towards goal related activities.   OBJECTIVE IMPAIRMENTS: decreased activity tolerance, decreased balance, decreased coordination, decreased endurance, decreased mobility, decreased ROM, decreased strength, hypomobility, increased fascial restrictions, increased muscle spasms, impaired flexibility, impaired sensation, impaired UE functional use, improper body mechanics, postural dysfunction, and pain.   ACTIVITY LIMITATIONS: carrying, lifting, bending, sitting, standing, sleeping, transfers, bathing, dressing, and reach over head  PARTICIPATION LIMITATIONS:  meal prep, cleaning, driving, community activity, and occupation  PERSONAL FACTORS: Age, Behavior pattern, Education, Past/current experiences, Profession, and 3+ comorbidities: Hx of bilateral shoulder arthroscopy, knee arthroscopy, A-Fib are also affecting patient's functional outcome.   REHAB POTENTIAL: Good  CLINICAL DECISION MAKING: Evolving/moderate complexity  EVALUATION COMPLEXITY: Moderate   GOALS: Goals reviewed with patient? Yes  SHORT TERM GOALS: Target date: 08/26/24  Pt will demonstrate  competency with initial HEP so he is able to complete household tasks independently Baseline:  Goal status: Met on 08/23/2024  2.  Pt will demonstrate self care strategies at home so he can improve independent habits Baseline:  Goal status: Met on 08/23/2024     LONG TERM GOALS: Target date: 11/11/2024  Pt will demonstrate competency with advanced HEP to allow for self progression after discharge. Baseline:  Goal status: Ongoing  2.  Pt will increase cervical ROM to [facility] to allow him to flex neck to read charts at work Baseline: 30 deg Goal status: Ongoing (see above)  3.  Pt will increase shoulder strength to 4+ to 5/5 bilaterally  so he can carry his work bag using the right arm Baseline:  Goal status: IN PROGRESS 10/13  4.  Pt will to report decreased cervical pain radiating down his right arm during functional tasks, such as reaching overhead to place IV back. Baseline:  Goal status: PARTIALLY MET 10/13 - not down arm, but some shoulder pain  5.  Pt will increase UEFS score to at least 75% so he is able to return to gardening fruit trees Baseline: 65% Goal status: Met on 09/01/24  6.  Pt will decrease Sit to stand time to 12 sec or less so he is increases functional capacity for going hiking Baseline: 16 sec  Goal status: Met on 09/01/24  7.  Patient will increase right grip strength to at least 60 pounds to allow him to more easily 40# bags of wood  pellets.  Baseline:  47 pounds  Goal status:  NEW on 09/12/2024   PLAN:  PT FREQUENCY: 1-2x/week  PT DURATION: 8 weeks  PLANNED INTERVENTIONS: 97164- PT Re-evaluation, 97750- Physical Performance Testing, 97110-Therapeutic exercises, 97530- Therapeutic activity, [id number]- Neuromuscular re-education, 97535- Self Care, [id number]- Manual therapy, [id number]- Gait training, [phone]- Orthotic Initial, [phone]- Canalith repositioning, [id number]- Aquatic Therapy, [phone]- Electrical stimulation (unattended), [phone]- Electrical stimulation (manual), [id number]- Vasopneumatic device, [id number]- Ultrasound, [id number]- Traction (mechanical), [id number]- Parrafin, [id number] (1-2 muscles), [id number] (3+ muscles)- Dry Needling, Patient/Family education, Balance training, Stair training, Taping, Joint mobilization, Joint manipulation, Spinal manipulation, Spinal mobilization, Vestibular training, Cryotherapy, and Moist heat  PLAN FOR NEXT SESSION: Progress OH strength as pt tolerates to allow for increased ease with OH reach at work; Cont cervical stretches and neck flexibility, shoulder mobility, nerve glides, isometrics, review supine scap series with theraband prn    Jarrell Laming, PT, DPT 09/12/24, 10:47 AM  [facility] [address] [place], [place] [address] Phone # [phone] Fax [phone]

## 2024-09-14 ENCOUNTER — Encounter: Payer: Self-pay | Admitting: Physical Therapy

## 2024-09-14 ENCOUNTER — Other Ambulatory Visit: Payer: Self-pay | Admitting: Cardiology

## 2024-09-14 ENCOUNTER — Ambulatory Visit: Admitting: Physical Therapy

## 2024-09-14 DIAGNOSIS — R293 Abnormal posture: Secondary | ICD-10-CM

## 2024-09-14 DIAGNOSIS — M6281 Muscle weakness (generalized): Secondary | ICD-10-CM

## 2024-09-14 DIAGNOSIS — R252 Cramp and spasm: Secondary | ICD-10-CM | POA: Diagnosis not present

## 2024-09-14 DIAGNOSIS — M542 Cervicalgia: Secondary | ICD-10-CM

## 2024-09-14 DIAGNOSIS — M5412 Radiculopathy, cervical region: Secondary | ICD-10-CM | POA: Diagnosis not present

## 2024-09-14 NOTE — Therapy (Addendum)
 OUTPATIENT PHYSICAL THERAPY TREATMENT NOTE AND REASSESSMENT VISIT   Patient Name: Terry Kim MRN: [id number] DOB:Nov 06, 1961, 63 y.o., male Today's Date: 09/14/2024  END OF SESSION:  PT End of Session - 09/14/24 1451     Visit Number 12    Date for Recertification  11/11/24    Authorization Type [facility]    PT Start Time 1450    PT Stop Time 1528    PT Time Calculation (min) 38 min    Activity Tolerance Patient tolerated treatment well    Behavior During Therapy WFL for tasks assessed/performed               Past Medical History:  Diagnosis Date   Hypertension    Obesity    Persistent atrial fibrillation (HCC)    CHADS2VASC score is 1   Pure hypercholesterolemia    Supraventricular premature beats    Past Surgical History:  Procedure Laterality Date   KNEE ARTHROSCOPY WITH MEDIAL MENISECTOMY Left 07/05/2014   Procedure: LEFT ARTHROSCOPY KNEE WITH MEDIAL MENISECTOMY/DEBRIDEMENT/SHAVING (CHONDROPLASTY)/ARTHROSCOPY KNEE ABRASION ARTHROPLASTY/MULTIPLE DRILLING;  Surgeon: LELON JONETTA Shari Mickey., MD;  Location: [facility];  Service: Orthopedics;  Laterality: Left;   SHOULDER ARTHROSCOPY  2000   right   SHOULDER ARTHROSCOPY W/ ROTATOR CUFF REPAIR  2012   left   TONSILLECTOMY     Patient Active Problem List   Diagnosis Date Noted   Glucose intolerance 02/10/2023   OSA (obstructive sleep apnea) 08/30/2020   Supraventricular premature beats    Persistent atrial fibrillation (HCC)    Obesity    Pure hypercholesterolemia    Hypertension     PCP: Carlin Gull, MD   REFERRING PROVIDER: Dorn Ned, MD  REFERRING DIAG: Cervical radiculopathy on right side   THERAPY DIAG:  Cervicalgia  Radiculopathy, cervical region  Muscle weakness (generalized)  Abnormal posture  Cramp and spasm  Rationale for Evaluation and Treatment: Rehabilitation  ONSET DATE: June 24, 25  SUBJECTIVE:                                                                                                                                                                                                          SUBJECTIVE STATEMENT: Pt with return of vertigo since Sunday. The technique is helping, but I had another bout right before coming today. Feel a little better, but still nauseous. Stiff in the neck today. Did a lot of stretching at work.   PERTINENT HISTORY:  Right shoulder arthroscopy in 2000 Left shoulder arthroscopy with rotator cuff repair in 2012  PAIN:   Are you having pain? Yes NPRS scale: 1/10 Pain location: cervical R PAIN TYPE: aching and dull Pain description: intermittent  Aggravating factors: lifting the couch in the house Relieving factors: stretching and rest    PRECAUTIONS: None  RED FLAGS: None     WEIGHT BEARING RESTRICTIONS: No  FALLS:  Has patient fallen in last 6 months? 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  DIAGNOSTIC FINDINGS:  Cervical MRI on 05/23/2024: IMPRESSION: 1. Cervical spondylosis and degenerative disc disease causing prominent foraminal stenosis at C3-4, C4-5, C5-6, and C6-7. Moderate right foraminal stenosis at C2-3.  PATIENT SURVEYS:  UEFS  Extreme difficulty/unable (0), Quite a bit of difficulty (1), Moderate difficulty (2), Little difficulty (3), No difficulty (4) Survey date:  07/27/24 09/01/24  Any of your usual work, household or school activities  2 3  2. Your usual hobbies, recreational/sport activities 2 2   3. Lifting a bag of groceries to waist level 3 3   4. Lifting a bag of groceries above your head 1 2  5. Grooming your hair 3 3  6. Pushing up on your hands (I.e. from bathtub or chair) 2 2  7. Preparing food (I.e. peeling/cutting) 3 4  8. Driving  3 3  9. Vacuuming, sweeping, or raking 3 3  10. Dressing  4 4  11. Doing up buttons 3 4  12. Using tools/appliances 3 3  13. Opening doors 3 4  14. Cleaning  3 4  15. Tying or lacing shoes 3 4  16. Sleeping  2 3  17. Laundering clothes (I.e. washing, ironing, folding) 2 4  18. Opening a jar 2 4  19. Throwing a ball 3 3  20. Carrying a small suitcase with your affected limb.  2 3  Score total:  52/80 = 65% 65/80 = 81.25%            COGNITION: Overall cognitive status: Within functional limits for tasks assessed  SENSATION: Not tested  POSTURE: rounded shoulders and forward head  PALPATION: TTP right upper trap    CERVICAL ROM:   Active ROM A/ROM (deg) eval A/ROM 09/01/24  Flexion 30 deg  35  Extension 20 deg 35  Right lateral flexion 22 deg  25  Left lateral flexion 40 deg  25  Right rotation 58 deg  55  Left rotation 55 deg  60   (Blank rows = not tested)  UPPER EXTREMITY ROM:  Active ROM Right eval Right 09/12/24 Left eval  Shoulder flexion 140 160 170  Shoulder extension     Shoulder abduction 140 165 180  Shoulder adduction     Shoulder extension     Shoulder internal rotation 8 50 55  Shoulder external rotation 60 60 80  Elbow flexion     Elbow extension     Wrist flexion     Wrist extension     Wrist ulnar deviation  Wrist radial deviation     Wrist pronation     Wrist supination      (Blank rows = not tested)  UPPER EXTREMITY MMT:  MMT Right eval Left eval Right 10/13 Left  10/13  Shoulder flexion 3+ 4+ 4+ 5  Shoulder extension 4+ 4+ 5 5  Shoulder abduction 4 4+ 4 5  Shoulder adduction      Shoulder internal rotation 4 4  4+ 4+  Shoulder external rotation 4- 4+ 4+ 5   (Blank rows = not tested)  09/12/2024: Right grip strength:  47 pounds Left grip strength:  75 pounds  CERVICAL SPECIAL TESTS:  Upper limb tension test (ULTT): Positive, Spurling's test: Positive, and Distraction test: Positive Bakody's Sign: positive Phalen's: positive Reverse phalen's: negative    FUNCTIONAL TESTS:  Eval: 5 times sit to stand: 16.40 sec   09/01/2024: 5 times sit to/from stand:  11.97 sec  TREATMENT DATE:   09/14/2024: UBE level 2 x 6 min alternating direction each minute with PT present to discuss status Reviewed Trenda Craze - pt did not want to perform in clinic Seated shoulder overhead press with 2# dumbbell 2x10 B Seated shoulder abduction with 2# dumbbell 2x10 Standing lat pulldown 35# 2 x10 Standing rows with 15# 2x10 Standing single arm shoulder chops 10# x10 bilat Standing unilateral horizontal ABD green x 10 B - challenging Standing ER green 2x10 R Cabinet reaches 2# x 10  eaR fwd and scaption Seated grip strengthening with red flexbar - hor/vert/rainbow bend x 10 ea 90 deg ER stretch in doorway 2x30 sec Wall walks light green band x 8 3 way reach light green band to fatigue   09/12/2024: UBE level 2 x 6 min alternating direction each minute with PT present to discuss status Supine shoulder A/ROM measurements Supine shoulder flexion with 3# dumbbell x15 right UE Left sidelying right shoulder ER with 3# dumbbell 2x10 (with 3 sec hold) Seated shoulder overhead press with 2# dumbbell 2x7 on right side, 2x10 on left Seated shoulder abduction with 2# dumbbell  Grip strength Seated grip strength with red medium putty x1 min Seated grip strengthening with red flexbar Standing lat pulldown 35# 2 x10 Standing rows with 15# 2x10  09/07/2024: UBE level 2 x 6 min alternating direction each minute with PT present to discuss status Standing 3 way scapular stabilization with blue loop 2x8   bilat Forearms on wall with light green loop for wall walks 2x8 Seated UT stretch x 30 sec B Standing shoulder horizontal abduction with green tband 2x10 Standing shoulder ER with green tband 2x10 unilaterally B Standing shoulder D2 with green tband 2x10  Standing lat pulldown 40# 2 x10 - reports some pain at end of second set Standing rows with 15# 2x10 ea at level 10 and level 4 Standing single arm shoulder chops 10# x10 bilat SL ER 2# x 10, then with 3 sec hold and slow return x 10 (challenging)  OH press 1-2 # x 10 challenging 90 deg ER stretch in doorway 2x30 sec   09/05/2024: UBE level 1.2 x 6 min alternating direction each minute with PT present to discuss status Standing 3 way scapular stabilization with blue loop 2x6  bilat Forearms on wall with light green loop for wall walks 2x6 Standing shoulder horizontal abduction with green tband 2x10 Standing shoulder ER with green tband 2x10 Standing shoulder D2 with green tband 2x10 bilat Standing lat pulldown 35# 2 x10 Standing rows with 15# 2x10 Standing shoulder chops 10# x10  bilat    PATIENT EDUCATION:  HEP update Person educated: Patient Education method: Explanation, Demonstration, Tactile cues, Verbal cues, and Handouts Education comprehension: verbalized understanding, returned demonstration, verbal cues required, tactile cues required, and needs further education  HOME EXERCISE PROGRAM: Access Code: [facility] URL: [email or web address] Date: 08/29/2024 Prepared by: Mliss  Exercises - Isometric Shoulder Flexion at Wall  - 1 x daily - 7 x weekly - 2 sets - 10 reps - Isometric Shoulder Extension at Wall  - 1 x daily - 7 x weekly - 2 sets - 10 reps - Isometric Shoulder Abduction at Wall  - 1 x daily - 7 x weekly - 2 sets - 10 reps - Standing Isometric Shoulder Internal Rotation at Doorway  - 1 x daily - 7 x weekly - 2 sets - 10 reps - Standing Isometric Shoulder External Rotation with Doorway  - 1 x  daily - 7 x weekly - 2 sets - 10 reps - Seated Scapular Retraction  - 1 x daily - 7 x weekly - 2 sets - 10 reps - Standing Radial Nerve Glide (Mirrored)  - 2 x daily - 7 x weekly - 1 sets - 10 reps - 5 sec on/5 sec off hold - Median Nerve Flossing (Mirrored)  - 1 x daily - 3 x weekly - 2 sets - 10 reps - Seated Cervical Sidebending Stretch  - 2 x daily - 7 x weekly - 1 sets - 3 reps - 30 sec hold - Seated Levator Scapulae Stretch  - 2 x daily - 7 x weekly - 1 sets - 3 reps - 30 sec hold - Forearm Walks on Wall with Resistance Band  - 1 x daily - 7 x weekly - 1-2 sets - 3-5 reps - Wall Clock with Theraband (Mirrored)  - 1 x daily - 3 x weekly - 1-3 sets - 10 reps - Self-Epley Maneuver Left Ear  - 1 x daily - 7 x weekly - 1-2 reps - Standing Shoulder Flexion with Resistance  - 1 x daily - 3 x weekly - 2 sets - 10 reps - Standing Single Arm Shoulder Abduction with Resistance  - 1 x daily - 3 x weekly - 2 sets - 10 reps - Standing Shoulder Internal Rotation with Anchored Resistance  - 1 x daily - 3 x weekly - 2 sets - 10 reps  ASSESSMENT:  CLINICAL IMPRESSION:  Patient reporting return of vertigo which he is managing at home, but concerned because it was not as effective this time. Technique reviewed and patient is not keeping his head looking down during the supine to sit transition, so he will try this. We kept exercise in standing and sitting today to avoid flare up. Good tolerance to TE overall.    OBJECTIVE IMPAIRMENTS: decreased activity tolerance, decreased balance, decreased coordination, decreased endurance, decreased mobility, decreased ROM, decreased strength, hypomobility, increased fascial restrictions, increased muscle spasms, impaired flexibility, impaired sensation, impaired UE functional use, improper body mechanics, postural dysfunction, and pain.   ACTIVITY LIMITATIONS: carrying, lifting, bending, sitting, standing, sleeping, transfers, bathing, dressing, and reach over  head  PARTICIPATION LIMITATIONS: meal prep, cleaning, driving, community activity, and occupation  PERSONAL FACTORS: Age, Behavior pattern, Education, Past/current experiences, Profession, and 3+ comorbidities: Hx of bilateral shoulder arthroscopy, knee arthroscopy, A-Fib are also affecting patient's functional outcome.   REHAB POTENTIAL: Good  CLINICAL DECISION MAKING: Evolving/moderate complexity  EVALUATION COMPLEXITY: Moderate   GOALS: Goals reviewed with patient? Yes  SHORT TERM GOALS: Target date:  08/26/24  Pt will demonstrate competency with initial HEP so he is able to complete household tasks independently Baseline:  Goal status: Met on 08/23/2024  2.  Pt will demonstrate self care strategies at home so he can improve independent habits Baseline:  Goal status: Met on 08/23/2024     LONG TERM GOALS: Target date: 11/11/2024  Pt will demonstrate competency with advanced HEP to allow for self progression after discharge. Baseline:  Goal status: Ongoing  2.  Pt will increase cervical ROM to [facility] to allow him to flex neck to read charts at work Baseline: 30 deg Goal status: Ongoing (see above)  3.  Pt will increase shoulder strength to 4+ to 5/5 bilaterally  so he can carry his work bag using the right arm Baseline:  Goal status: IN PROGRESS 10/13  4.  Pt will to report decreased cervical pain radiating down his right arm during functional tasks, such as reaching overhead to place IV back. Baseline:  Goal status: PARTIALLY MET 10/13 - not down arm, but some shoulder pain  5.  Pt will increase UEFS score to at least 75% so he is able to return to gardening fruit trees Baseline: 65% Goal status: Met on 09/01/24  6.  Pt will decrease Sit to stand time to 12 sec or less so he is increases functional capacity for going hiking Baseline: 16 sec  Goal status: Met on 09/01/24  7.  Patient will increase right grip strength to at least 60 pounds to allow him to more easily  40# bags of wood pellets.  Baseline:  47 pounds  Goal status:  NEW on 09/12/2024   PLAN:  PT FREQUENCY: 1-2x/week  PT DURATION: 8 weeks  PLANNED INTERVENTIONS: 97164- PT Re-evaluation, 97750- Physical Performance Testing, 97110-Therapeutic exercises, 97530- Therapeutic activity, [id number]- Neuromuscular re-education, 97535- Self Care, [id number]- Manual therapy, [id number]- Gait training, [phone]- Orthotic Initial, [phone]- Canalith repositioning, [id number]- Aquatic Therapy, [phone]- Electrical stimulation (unattended), [phone]- Electrical stimulation (manual), [id number]- Vasopneumatic device, [id number]- Ultrasound, [id number]- Traction (mechanical), [id number]- Parrafin, [id number] (1-2 muscles), [id number] (3+ muscles)- Dry Needling, Patient/Family education, Balance training, Stair training, Taping, Joint mobilization, Joint manipulation, Spinal manipulation, Spinal mobilization, Vestibular training, Cryotherapy, and Moist heat  PLAN FOR NEXT SESSION: Progress OH strength as pt tolerates to allow for increased ease with OH reach at work; Cont cervical stretches and neck flexibility, shoulder mobility, nerve glides, isometrics, review supine scap series with theraband prn    Jarrell Laming, PT, DPT 09/14/24, 3:31 PM  [facility] [address], [address] [place], [place] [address] Phone # [phone] Fax [phone]

## 2024-09-15 ENCOUNTER — Other Ambulatory Visit: Payer: Self-pay

## 2024-09-15 ENCOUNTER — Other Ambulatory Visit (HOSPITAL_BASED_OUTPATIENT_CLINIC_OR_DEPARTMENT_OTHER): Payer: Self-pay

## 2024-09-15 MED ORDER — REPATHA SURECLICK 140 MG/ML ~~LOC~~ SOAJ
140.0000 mg | SUBCUTANEOUS | 11 refills | Status: AC
  Filled 2024-09-15 – 2024-09-22 (×3): qty 2, 28d supply, fill #0
  Filled 2024-11-14: qty 2, 28d supply, fill #1

## 2024-09-16 ENCOUNTER — Telehealth: Payer: Self-pay | Admitting: Pharmacy Technician

## 2024-09-16 ENCOUNTER — Other Ambulatory Visit (HOSPITAL_BASED_OUTPATIENT_CLINIC_OR_DEPARTMENT_OTHER): Payer: Self-pay

## 2024-09-16 NOTE — Telephone Encounter (Signed)
   Pharmacy Patient Advocate Encounter   Received notification from CoverMyMeds that prior authorization for repatha  is required/requested.   Insurance verification completed.   The patient is insured through [facility].   Per test claim: PA required; PA submitted to above mentioned insurance via Latent Key/confirmation #/EOC [id number] Status is pending

## 2024-09-16 NOTE — Telephone Encounter (Signed)
 Pharmacy Patient Advocate Encounter  Received notification from [facility] that Prior Authorization for repatha  has been APPROVED from 09/16/24 to 09/15/25   PA #/Case ID/Reference #: [id number]

## 2024-09-19 ENCOUNTER — Other Ambulatory Visit (HOSPITAL_BASED_OUTPATIENT_CLINIC_OR_DEPARTMENT_OTHER): Payer: Self-pay

## 2024-09-22 ENCOUNTER — Other Ambulatory Visit (HOSPITAL_BASED_OUTPATIENT_CLINIC_OR_DEPARTMENT_OTHER): Payer: Self-pay

## 2024-09-22 ENCOUNTER — Other Ambulatory Visit: Payer: Self-pay

## 2024-09-27 ENCOUNTER — Encounter (HOSPITAL_BASED_OUTPATIENT_CLINIC_OR_DEPARTMENT_OTHER): Payer: Self-pay

## 2024-09-27 ENCOUNTER — Other Ambulatory Visit (HOSPITAL_BASED_OUTPATIENT_CLINIC_OR_DEPARTMENT_OTHER): Payer: Self-pay

## 2024-09-27 NOTE — Therapy (Signed)
 OUTPATIENT PHYSICAL THERAPY TREATMENT NOTE AND REASSESSMENT VISIT   Patient Name: Terry Kim MRN: [id number] DOB:10-18-1961, 63 y.o., male Today's Date: 09/28/2024  END OF SESSION:  PT End of Session - 09/28/24 1512     Visit Number 13    Date for Recertification  11/11/24    Authorization Type [facility]    PT Start Time 1450    PT Stop Time 1530    PT Time Calculation (min) 40 min    Activity Tolerance Patient tolerated treatment well    Behavior During Therapy [facility] for tasks assessed/performed                Past Medical History:  Diagnosis Date   Hypertension    Obesity    Persistent atrial fibrillation (HCC)    CHADS2VASC score is 1   Pure hypercholesterolemia    Supraventricular premature beats    Past Surgical History:  Procedure Laterality Date   KNEE ARTHROSCOPY WITH MEDIAL MENISECTOMY Left 07/05/2014   Procedure: LEFT ARTHROSCOPY KNEE WITH MEDIAL MENISECTOMY/DEBRIDEMENT/SHAVING (CHONDROPLASTY)/ARTHROSCOPY KNEE ABRASION ARTHROPLASTY/MULTIPLE DRILLING;  Surgeon: LELON JONETTA Shari Mickey., MD;  Location:  SURGERY CENTER;  Service: Orthopedics;  Laterality: Left;   SHOULDER ARTHROSCOPY  2000   right   SHOULDER ARTHROSCOPY W/ ROTATOR CUFF REPAIR  2012   left   TONSILLECTOMY     Patient Active Problem List   Diagnosis Date Noted   Glucose intolerance 02/10/2023   OSA (obstructive sleep apnea) 08/30/2020   Supraventricular premature beats    Persistent atrial fibrillation (HCC)    Obesity    Pure hypercholesterolemia    Hypertension     PCP: Carlin Gull, MD   REFERRING PROVIDER: Dorn Ned, MD  REFERRING DIAG: Cervical radiculopathy on right side   THERAPY DIAG:  Cervicalgia  Radiculopathy, cervical region  Muscle weakness (generalized)  Abnormal posture  Cramp and spasm  Rationale for Evaluation and Treatment: Rehabilitation  ONSET DATE: June 24, 25  SUBJECTIVE:                                                                                                                                                                                                          SUBJECTIVE STATEMENT: I was able to do consistent exercises when I was on vacation last week.   PERTINENT HISTORY:  Right shoulder arthroscopy in 2000 Left shoulder arthroscopy with rotator cuff repair in 2012  PAIN:   Are you having pain? Yes NPRS scale: 1/10 Pain location: cervical R PAIN TYPE: aching and dull Pain description: intermittent  Aggravating factors: lifting the couch in the house Relieving factors: stretching and rest    PRECAUTIONS: None  RED FLAGS: None     WEIGHT BEARING RESTRICTIONS: No  FALLS:  Has patient fallen in last 6 months? 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  DIAGNOSTIC FINDINGS:  Cervical MRI on 05/23/2024: IMPRESSION: 1. Cervical spondylosis and degenerative disc disease causing prominent foraminal stenosis at C3-4, C4-5, C5-6, and C6-7. Moderate right foraminal stenosis at C2-3.  PATIENT SURVEYS:  UEFS  Extreme difficulty/unable (0), Quite a bit of difficulty (1), Moderate difficulty (2), Little difficulty (3), No difficulty (4) Survey date:  07/27/24 09/01/24  Any of your usual work, household or school activities 2 3  2. Your usual hobbies, recreational/sport activities 2 2   3. Lifting a bag of groceries to waist level 3 3   4. Lifting a bag of  groceries above your head 1 2  5. Grooming your hair 3 3  6. Pushing up on your hands (I.e. from bathtub or chair) 2 2  7. Preparing food (I.e. peeling/cutting) 3 4  8. Driving  3 3  9. Vacuuming, sweeping, or raking 3 3  10. Dressing  4 4  11. Doing up buttons 3 4  12. Using tools/appliances 3 3  13. Opening doors 3 4  14. Cleaning  3 4  15. Tying or lacing shoes 3 4  16. Sleeping  2 3  17. Laundering clothes (I.e. washing, ironing, folding) 2 4  18. Opening a jar 2 4  19. Throwing a ball 3 3  20. Carrying a small suitcase with your affected limb.  2 3  Score total:  52/80 = 65% 65/80 = 81.25%            COGNITION: Overall cognitive status: Within functional limits for tasks assessed  SENSATION: Not tested  POSTURE: rounded shoulders and forward head  PALPATION: TTP right upper trap    CERVICAL ROM:   Active ROM A/ROM (deg) eval A/ROM 09/01/24  Flexion 30 deg  35  Extension 20 deg 35  Right lateral flexion 22 deg  25  Left lateral flexion 40 deg  25  Right rotation 58 deg  55  Left rotation 55 deg  60   (Blank rows = not tested)  UPPER EXTREMITY ROM:  Active ROM Right eval Right 09/12/24 Left eval  Shoulder flexion 140 160 170  Shoulder extension     Shoulder abduction 140 165 180  Shoulder adduction     Shoulder extension     Shoulder internal rotation 8 50 55  Shoulder external rotation 60 60 80  Elbow flexion     Elbow extension     Wrist flexion     Wrist extension     Wrist ulnar deviation     Wrist radial deviation     Wrist pronation     Wrist supination      (Blank rows =  not tested)  UPPER EXTREMITY MMT:  MMT Right eval Left eval Right 10/13 Left  10/13  Shoulder flexion 3+ 4+ 4+ 5  Shoulder extension 4+ 4+ 5 5  Shoulder abduction 4 4+ 4 5  Shoulder adduction      Shoulder internal rotation 4 4 4+ 4+  Shoulder external rotation 4- 4+ 4+ 5   (Blank rows = not tested)  09/12/2024: Right grip strength:  47 pounds Left grip  strength:  75 pounds  CERVICAL SPECIAL TESTS:  Upper limb tension test (ULTT): Positive, Spurling's test: Positive, and Distraction test: Positive Bakody's Sign: positive Phalen's: positive Reverse phalen's: negative    FUNCTIONAL TESTS:  Eval: 5 times sit to stand: 16.40 sec   09/01/2024: 5 times sit to/from stand:  11.97 sec  TREATMENT DATE:   09/14/2024: UBE level 2 x 6 min alternating direction each minute with PT present to discuss status Seated shoulder overhead press with 2# dumbbell 2x10 B Seated shoulder abduction and flexion with 3# dumbbell 2x10 Cabinet reaches 3# x 10  ea R fwd and scaption Standing lat pulldown 35# 2 x10 Standing single arm row with 10# 2x10 Bilat Standing single arm shoulder chops 10# 2x10 bilat Standing unilateral horizontal ABD green 2x 10 B - challenging Standing ER green 2x10 R Wall walks light green band 2x10 3 way reach light green band x 10 Seated grip strengthening with tan flexbar Digiflex 5# R hand  09/12/2024: UBE level 2 x 6 min alternating direction each minute with PT present to discuss status Supine shoulder A/ROM measurements Supine shoulder flexion with 3# dumbbell x15 right UE Left sidelying right shoulder ER with 3# dumbbell 2x10 (with 3 sec hold) Seated shoulder overhead press with 2# dumbbell 2x7 on right side, 2x10 on left Seated shoulder abduction with 2# dumbbell  Grip strength Seated grip strength with red medium putty x1 min Seated grip strengthening with red flexbar Standing lat pulldown 35# 2 x10 Standing rows with 15# 2x10  09/07/2024: UBE level 2 x 6 min alternating direction each minute with PT present to discuss status Standing 3 way scapular stabilization with blue loop 2x8  bilat Forearms on wall with light green loop for wall walks 2x8 Seated UT stretch x 30 sec B Standing shoulder horizontal abduction with green tband 2x10 Standing shoulder ER with green tband 2x10 unilaterally B Standing shoulder  D2 with green tband 2x10  Standing lat pulldown 40# 2 x10 - reports some pain at end of second set Standing rows with 15# 2x10 ea at level 10 and level 4 Standing single arm shoulder chops 10# x10 bilat SL ER 2# x 10, then with 3 sec hold and slow return x 10 (challenging)  OH press 1-2 # x 10 challenging 90 deg ER stretch in doorway 2x30 sec   09/05/2024: UBE level 1.2 x 6 min alternating direction each minute with PT present to discuss status Standing 3 way scapular stabilization with blue loop 2x6  bilat Forearms on wall with light green loop for wall walks 2x6 Standing shoulder horizontal abduction with green tband 2x10 Standing shoulder ER with green tband 2x10 Standing shoulder D2 with green tband 2x10 bilat Standing lat pulldown 35# 2 x10 Standing rows with 15# 2x10 Standing shoulder chops 10# x10 bilat    PATIENT EDUCATION:  HEP update Person educated: Patient Education method: Explanation, Demonstration, Tactile cues, Verbal cues, and Handouts Education comprehension: verbalized understanding, returned demonstration, verbal cues required, tactile cues required, and needs further education  HOME EXERCISE  PROGRAM: Access Code: [facility] URL: [email or web address] Date: 08/29/2024 Prepared by: Mliss  Exercises - Isometric Shoulder Flexion at Wall  - 1 x daily - 7 x weekly - 2 sets - 10 reps - Isometric Shoulder Extension at Wall  - 1 x daily - 7 x weekly - 2 sets - 10 reps - Isometric Shoulder Abduction at Wall  - 1 x daily - 7 x weekly - 2 sets - 10 reps - Standing Isometric Shoulder Internal Rotation at Doorway  - 1 x daily - 7 x weekly - 2 sets - 10 reps - Standing Isometric Shoulder External Rotation with Doorway  - 1 x daily - 7 x weekly - 2 sets - 10 reps - Seated Scapular Retraction  - 1 x daily - 7 x weekly - 2 sets - 10 reps - Standing Radial Nerve Glide (Mirrored)  - 2 x daily - 7 x weekly - 1 sets - 10 reps - 5 sec on/5 sec off hold - Median  Nerve Flossing (Mirrored)  - 1 x daily - 3 x weekly - 2 sets - 10 reps - Seated Cervical Sidebending Stretch  - 2 x daily - 7 x weekly - 1 sets - 3 reps - 30 sec hold - Seated Levator Scapulae Stretch  - 2 x daily - 7 x weekly - 1 sets - 3 reps - 30 sec hold - Forearm Walks on Wall with Resistance Band  - 1 x daily - 7 x weekly - 1-2 sets - 3-5 reps - Wall Clock with Theraband (Mirrored)  - 1 x daily - 3 x weekly - 1-3 sets - 10 reps - Self-Epley Maneuver Left Ear  - 1 x daily - 7 x weekly - 1-2 reps - Standing Shoulder Flexion with Resistance  - 1 x daily - 3 x weekly - 2 sets - 10 reps - Standing Single Arm Shoulder Abduction with Resistance  - 1 x daily - 3 x weekly - 2 sets - 10 reps - Standing Shoulder Internal Rotation with Anchored Resistance  - 1 x daily - 3 x weekly - 2 sets - 10 reps  ASSESSMENT:  CLINICAL IMPRESSION:  Terry Kim continues to progress with UE strength. He was able to increase sets today with most exercises and resistance with shoulder strengthening. He is challenged with functional OH activities and with scapular stabilization. He demonstrates less fatigue with exercises overall. He continues to demonstrate potential for improvement and would benefit from continued skilled therapy to address remaining impairments.    OBJECTIVE IMPAIRMENTS: decreased activity tolerance, decreased balance, decreased coordination, decreased endurance, decreased mobility, decreased ROM, decreased strength, hypomobility, increased fascial restrictions, increased muscle spasms, impaired flexibility, impaired sensation, impaired UE functional use, improper body mechanics, postural dysfunction, and pain.   ACTIVITY LIMITATIONS: carrying, lifting, bending, sitting, standing, sleeping, transfers, bathing, dressing, and reach over head  PARTICIPATION LIMITATIONS: meal prep, cleaning, driving, community activity, and occupation  PERSONAL FACTORS: Age, Behavior pattern, Education, Past/current  experiences, Profession, and 3+ comorbidities: Hx of bilateral shoulder arthroscopy, knee arthroscopy, A-Fib are also affecting patient's functional outcome.   REHAB POTENTIAL: Good  CLINICAL DECISION MAKING: Evolving/moderate complexity  EVALUATION COMPLEXITY: Moderate   GOALS: Goals reviewed with patient? Yes  SHORT TERM GOALS: Target date: 08/26/24  Pt will demonstrate competency with initial HEP so he is able to complete household tasks independently Baseline:  Goal status: Met on 08/23/2024  2.  Pt will demonstrate self care strategies at home so he can improve independent habits Baseline:  Goal status: Met on 08/23/2024     LONG TERM GOALS: Target date: 11/11/2024  Pt will demonstrate competency with advanced HEP to allow for self progression after discharge. Baseline:  Goal status: Ongoing  2.  Pt will increase cervical ROM to [facility] to allow him to flex neck to read charts at work Baseline: 30 deg Goal status: Ongoing (see above)  3.  Pt will increase shoulder strength to 4+ to 5/5 bilaterally  so he can carry his work bag using the right arm Baseline:  Goal status: IN PROGRESS 10/13  4.  Pt will to report decreased cervical pain radiating down his right arm during functional tasks, such as reaching overhead to place IV back. Baseline:  Goal status: PARTIALLY MET 10/13 - not down arm, but some shoulder pain  5.  Pt will increase UEFS score to at least 75% so he is able to return to gardening fruit trees Baseline: 65% Goal status: Met on 09/01/24  6.  Pt will decrease Sit to stand time to 12 sec or less so he is increases functional capacity for going hiking Baseline: 16 sec  Goal status: Met on 09/01/24  7.  Patient will increase right grip strength to at least 60 pounds to allow him to more easily 40# bags of wood pellets.  Baseline:  47 pounds  Goal status:  NEW on 09/12/2024   PLAN:  PT FREQUENCY: 1-2x/week  PT DURATION: 8 weeks  PLANNED  INTERVENTIONS: 97164- PT Re-evaluation, 97750- Physical Performance Testing, 97110-Therapeutic exercises, 97530- Therapeutic activity, [id number]- Neuromuscular re-education, 97535- Self Care, [id number]- Manual therapy, [id number]- Gait training, [phone]- Orthotic Initial, [phone]- Canalith repositioning, [id number]- Aquatic Therapy, [phone]- Electrical stimulation (unattended), [phone]- Electrical stimulation (manual), [id number]- Vasopneumatic device, [id number]- Ultrasound, [id number]- Traction (mechanical), [id number]- Parrafin, [id number] (1-2 muscles), [id number] (3+ muscles)- Dry Needling, Patient/Family education, Balance training, Stair training, Taping, Joint mobilization, Joint manipulation, Spinal manipulation, Spinal mobilization, Vestibular training, Cryotherapy, and Moist heat  PLAN FOR NEXT SESSION: Progress OH strength as pt tolerates to allow for increased ease with OH reach at work; Cont cervical stretches and neck flexibility, shoulder mobility, nerve glides, isometrics, review supine scap series with theraband prn   Mliss Cummins, PT 09/28/24, 3:35 PM  [facility] [address] [place], [place] [address] Phone # [phone] Fax [phone]

## 2024-09-28 ENCOUNTER — Other Ambulatory Visit (HOSPITAL_BASED_OUTPATIENT_CLINIC_OR_DEPARTMENT_OTHER): Payer: Self-pay

## 2024-09-28 ENCOUNTER — Encounter: Payer: Self-pay | Admitting: Physical Therapy

## 2024-09-28 ENCOUNTER — Ambulatory Visit: Attending: Neurosurgery | Admitting: Physical Therapy

## 2024-09-28 ENCOUNTER — Other Ambulatory Visit: Payer: Self-pay

## 2024-09-28 DIAGNOSIS — M5412 Radiculopathy, cervical region: Secondary | ICD-10-CM | POA: Insufficient documentation

## 2024-09-28 DIAGNOSIS — R293 Abnormal posture: Secondary | ICD-10-CM | POA: Insufficient documentation

## 2024-09-28 DIAGNOSIS — M542 Cervicalgia: Secondary | ICD-10-CM | POA: Diagnosis not present

## 2024-09-28 DIAGNOSIS — R252 Cramp and spasm: Secondary | ICD-10-CM | POA: Diagnosis not present

## 2024-09-28 DIAGNOSIS — M6281 Muscle weakness (generalized): Secondary | ICD-10-CM | POA: Insufficient documentation

## 2024-10-01 ENCOUNTER — Other Ambulatory Visit (HOSPITAL_BASED_OUTPATIENT_CLINIC_OR_DEPARTMENT_OTHER): Payer: Self-pay

## 2024-10-01 ENCOUNTER — Other Ambulatory Visit: Payer: Self-pay

## 2024-10-05 ENCOUNTER — Ambulatory Visit: Admitting: Rehabilitative and Restorative Service Providers"

## 2024-10-10 ENCOUNTER — Encounter: Payer: Self-pay | Admitting: Rehabilitative and Restorative Service Providers"

## 2024-10-10 ENCOUNTER — Ambulatory Visit: Admitting: Rehabilitative and Restorative Service Providers"

## 2024-10-10 DIAGNOSIS — M542 Cervicalgia: Secondary | ICD-10-CM | POA: Diagnosis not present

## 2024-10-10 DIAGNOSIS — R293 Abnormal posture: Secondary | ICD-10-CM | POA: Diagnosis not present

## 2024-10-10 DIAGNOSIS — R252 Cramp and spasm: Secondary | ICD-10-CM

## 2024-10-10 DIAGNOSIS — M5412 Radiculopathy, cervical region: Secondary | ICD-10-CM

## 2024-10-10 DIAGNOSIS — M6281 Muscle weakness (generalized): Secondary | ICD-10-CM | POA: Diagnosis not present

## 2024-10-10 NOTE — Therapy (Signed)
 OUTPATIENT PHYSICAL THERAPY TREATMENT NOTE AND REASSESSMENT VISIT   Patient Name: Terry Kim MRN: [id number] DOB:1961-07-07, 63 y.o., male Today's Date: 10/10/2024  END OF SESSION:  PT End of Session - 10/10/24 0844     Visit Number 14    Date for Recertification  11/11/24    Authorization Type [facility]    PT Start Time 0845    PT Stop Time 0925    PT Time Calculation (min) 40 min    Activity Tolerance Patient tolerated treatment well    Behavior During Therapy [facility] for tasks assessed/performed                Past Medical History:  Diagnosis Date   Hypertension    Obesity    Persistent atrial fibrillation (HCC)    CHADS2VASC score is 1   Pure hypercholesterolemia    Supraventricular premature beats    Past Surgical History:  Procedure Laterality Date   KNEE ARTHROSCOPY WITH MEDIAL MENISECTOMY Left 07/05/2014   Procedure: LEFT ARTHROSCOPY KNEE WITH MEDIAL MENISECTOMY/DEBRIDEMENT/SHAVING (CHONDROPLASTY)/ARTHROSCOPY KNEE ABRASION ARTHROPLASTY/MULTIPLE DRILLING;  Surgeon: LELON JONETTA Shari Mickey., MD;  Location: [facility];  Service: Orthopedics;  Laterality: Left;   SHOULDER ARTHROSCOPY  2000   right   SHOULDER ARTHROSCOPY W/ ROTATOR CUFF REPAIR  2012   left   TONSILLECTOMY     Patient Active Problem List   Diagnosis Date Noted   Glucose intolerance 02/10/2023   OSA (obstructive sleep apnea) 08/30/2020   Supraventricular premature beats    Persistent atrial fibrillation (HCC)    Obesity    Pure hypercholesterolemia    Hypertension     PCP: Carlin Gull, MD   REFERRING PROVIDER: Dorn Ned, MD  REFERRING DIAG: Cervical radiculopathy on right side   THERAPY DIAG:  Cervicalgia  Radiculopathy, cervical region  Muscle weakness (generalized)  Abnormal posture  Cramp and spasm  Rationale for Evaluation and Treatment: Rehabilitation  ONSET DATE: June 24, 25  SUBJECTIVE:                                                                                                                                                                                                          SUBJECTIVE STATEMENT: Reports that he has been doing a lot of activities to clean his home and prepare for holiday events.  PERTINENT HISTORY:  Right shoulder arthroscopy in 2000 Left shoulder arthroscopy with rotator cuff repair in 2012  PAIN:   Are you having pain? Yes NPRS scale: 3/10 Pain location: cervical R PAIN TYPE: aching and dull  Pain description: intermittent  Aggravating factors: lifting the couch in the house Relieving factors: stretching and rest    PRECAUTIONS: None  RED FLAGS: None     WEIGHT BEARING RESTRICTIONS: No  FALLS:  Has patient fallen in last 6 months? 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  DIAGNOSTIC FINDINGS:  Cervical MRI on 05/23/2024: IMPRESSION: 1. Cervical spondylosis and degenerative disc disease causing prominent foraminal stenosis at C3-4, C4-5, C5-6, and C6-7. Moderate right foraminal stenosis at C2-3.  PATIENT SURVEYS:  UEFS  Extreme difficulty/unable (0), Quite a bit of difficulty (1), Moderate difficulty (2), Little difficulty (3), No difficulty (4) Survey date:  07/27/24 09/01/24  Any of your usual work, household or school activities 2 3  2. Your usual hobbies, recreational/sport activities 2 2   3. Lifting a bag of groceries to waist level 3 3    4. Lifting a bag of groceries above your head 1 2  5. Grooming your hair 3 3  6. Pushing up on your hands (I.e. from bathtub or chair) 2 2  7. Preparing food (I.e. peeling/cutting) 3 4  8. Driving  3 3  9. Vacuuming, sweeping, or raking 3 3  10. Dressing  4 4  11. Doing up buttons 3 4  12. Using tools/appliances 3 3  13. Opening doors 3 4  14. Cleaning  3 4  15. Tying or lacing shoes 3 4  16. Sleeping  2 3  17. Laundering clothes (I.e. washing, ironing, folding) 2 4  18. Opening a jar 2 4  19. Throwing a ball 3 3  20. Carrying a small suitcase with your affected limb.  2 3  Score total:  52/80 = 65% 65/80 = 81.25%            COGNITION: Overall cognitive status: Within functional limits for tasks assessed  SENSATION: Not tested  POSTURE: rounded shoulders and forward head  PALPATION: TTP right upper trap    CERVICAL ROM:   Active ROM A/ROM (deg) eval A/ROM 09/01/24  Flexion 30 deg  35  Extension 20 deg 35  Right lateral flexion 22 deg  25  Left lateral flexion 40 deg  25  Right rotation 58 deg  55  Left rotation 55 deg  60   (Blank rows = not tested)  UPPER EXTREMITY ROM:  Active ROM Right eval Right 09/12/24 Left eval  Shoulder flexion 140 160 170  Shoulder extension     Shoulder abduction 140 165 180  Shoulder adduction     Shoulder extension     Shoulder internal rotation 8 50 55  Shoulder external rotation 60 60 80  Elbow flexion     Elbow extension     Wrist flexion     Wrist extension     Wrist ulnar deviation     Wrist radial deviation     Wrist pronation     Wrist supination      (  Blank rows = not tested)  UPPER EXTREMITY MMT:  MMT Right eval Left eval Right 10/13 Left  10/13  Shoulder flexion 3+ 4+ 4+ 5  Shoulder extension 4+ 4+ 5 5  Shoulder abduction 4 4+ 4 5  Shoulder adduction      Shoulder internal rotation 4 4 4+ 4+  Shoulder external rotation 4- 4+ 4+ 5   (Blank rows = not tested)  09/12/2024: Right grip strength:  47  pounds Left grip strength:  75 pounds  10/10/2024: Right grip strength:  52 pounds Left grip strength: 78 pounds  CERVICAL SPECIAL TESTS:  Upper limb tension test (ULTT): Positive, Spurling's test: Positive, and Distraction test: Positive Bakody's Sign: positive Phalen's: positive Reverse phalen's: negative    FUNCTIONAL TESTS:  Eval: 5 times sit to stand: 16.40 sec   09/01/2024: 5 times sit to/from stand:  11.97 sec  TREATMENT DATE:   10/10/2024: UBE level 2.0 x3 min each direction with PT present to discuss status Standing shoulder ER with red tband 2x10 Standing shoulder horizontal abduction with red tband 2x10 Seated shoulder overhead press with 3# dumbbell 2x10 B Seated shoulder abduction and flexion with 3# dumbbell 2x10 Seated grip strengthening with tan flexbar twist x20 Seated tan flexbar bending x20 Standing lower trap lift off with red tband 2x10 Standing right shoulder IR towel stretch 2x20 sec Standing left shoulder ER towel stretch 2x20 sec  Standing open book with red tband x10 bilat Quadruped reach under x10 bilat Standing lat pulldown 35# 2 x10 Standing rows 15# 2x10 Standing right shoulder D2 with red tband 2x10   09/14/2024: UBE level 2 x 6 min alternating direction each minute with PT present to discuss status Seated shoulder overhead press with 2# dumbbell 2x10 B Seated shoulder abduction and flexion with 3# dumbbell 2x10 Cabinet reaches 3# x 10  ea R fwd and scaption Standing lat pulldown 35# 2 x10 Standing single arm row with 10# 2x10 Bilat Standing single arm shoulder chops 10# 2x10 bilat Standing unilateral horizontal ABD green 2x 10 B - challenging Standing ER green 2x10 R Wall walks light green band 2x10 3 way reach light green band x 10 Seated grip strengthening with tan flexbar Digiflex 5# R hand  09/12/2024: UBE level 2 x 6 min alternating direction each minute with PT present to discuss status Supine shoulder A/ROM  measurements Supine shoulder flexion with 3# dumbbell x15 right UE Left sidelying right shoulder ER with 3# dumbbell 2x10 (with 3 sec hold) Seated shoulder overhead press with 2# dumbbell 2x7 on right side, 2x10 on left Seated shoulder abduction with 2# dumbbell  Grip strength Seated grip strength with red medium putty x1 min Seated grip strengthening with red flexbar Standing lat pulldown 35# 2 x10 Standing rows with 15# 2x10    PATIENT EDUCATION:  HEP update Person educated: Patient Education method: Explanation, Demonstration, Tactile cues, Verbal cues, and Handouts Education comprehension: verbalized understanding, returned demonstration, verbal cues required, tactile cues required, and needs further education  HOME EXERCISE PROGRAM: Access Code: [facility] URL: [email or web address] Date: 08/29/2024 Prepared by: Mliss  Exercises - Isometric Shoulder Flexion at Wall  - 1 x daily - 7 x weekly - 2 sets - 10 reps - Isometric Shoulder Extension at Wall  - 1 x daily - 7 x weekly - 2 sets - 10 reps - Isometric Shoulder Abduction at Wall  - 1 x daily - 7 x weekly - 2 sets - 10 reps - Standing Isometric Shoulder Internal Rotation at Doorway  -  1 x daily - 7 x weekly - 2 sets - 10 reps - Standing Isometric Shoulder External Rotation with Doorway  - 1 x daily - 7 x weekly - 2 sets - 10 reps - Seated Scapular Retraction  - 1 x daily - 7 x weekly - 2 sets - 10 reps - Standing Radial Nerve Glide (Mirrored)  - 2 x daily - 7 x weekly - 1 sets - 10 reps - 5 sec on/5 sec off hold - Median Nerve Flossing (Mirrored)  - 1 x daily - 3 x weekly - 2 sets - 10 reps - Seated Cervical Sidebending Stretch  - 2 x daily - 7 x weekly - 1 sets - 3 reps - 30 sec hold - Seated Levator Scapulae Stretch  - 2 x daily - 7 x weekly - 1 sets - 3 reps - 30 sec hold - Forearm Walks on Wall with Resistance Band  - 1 x daily - 7 x weekly - 1-2 sets - 3-5 reps - Wall Clock with Theraband (Mirrored)  - 1 x  daily - 3 x weekly - 1-3 sets - 10 reps - Self-Epley Maneuver Left Ear  - 1 x daily - 7 x weekly - 1-2 reps - Standing Shoulder Flexion with Resistance  - 1 x daily - 3 x weekly - 2 sets - 10 reps - Standing Single Arm Shoulder Abduction with Resistance  - 1 x daily - 3 x weekly - 2 sets - 10 reps - Standing Shoulder Internal Rotation with Anchored Resistance  - 1 x daily - 3 x weekly - 2 sets - 10 reps  ASSESSMENT:  CLINICAL IMPRESSION:  Mr Bonanno presents to skilled PT reporting that he is overall feeling stronger.  Patient with slight improvement noted on right grip strength today.  Patient did report that he noted some difficulty with reaching behind his head, so added shoulder stretching for IR and ER with towel and he reported that it felt good.  Patient continues to progress with overhead strengthening.  Patient reports that the new exercises seemed to help and he did enjoy the spinal mobilization.  Patient continues to require skilled PT to progress towards goal related activities.  OBJECTIVE IMPAIRMENTS: decreased activity tolerance, decreased balance, decreased coordination, decreased endurance, decreased mobility, decreased ROM, decreased strength, hypomobility, increased fascial restrictions, increased muscle spasms, impaired flexibility, impaired sensation, impaired UE functional use, improper body mechanics, postural dysfunction, and pain.   ACTIVITY LIMITATIONS: carrying, lifting, bending, sitting, standing, sleeping, transfers, bathing, dressing, and reach over head  PARTICIPATION LIMITATIONS: meal prep, cleaning, driving, community activity, and occupation  PERSONAL FACTORS: Age, Behavior pattern, Education, Past/current experiences, Profession, and 3+ comorbidities: Hx of bilateral shoulder arthroscopy, knee arthroscopy, A-Fib are also affecting patient's functional outcome.   REHAB POTENTIAL: Good  CLINICAL DECISION MAKING: Evolving/moderate complexity  EVALUATION  COMPLEXITY: Moderate   GOALS: Goals reviewed with patient? Yes  SHORT TERM GOALS: Target date: 08/26/24  Pt will demonstrate competency with initial HEP so he is able to complete household tasks independently Baseline:  Goal status: Met on 08/23/2024  2.  Pt will demonstrate self care strategies at home so he can improve independent habits Baseline:  Goal status: Met on 08/23/2024     LONG TERM GOALS: Target date: 11/11/2024  Pt will demonstrate competency with advanced HEP to allow for self progression after discharge. Baseline:  Goal status: Ongoing  2.  Pt will increase cervical ROM to [facility] to allow him to flex neck to  read charts at work Baseline: 30 deg Goal status: Ongoing (see above)  3.  Pt will increase shoulder strength to 4+ to 5/5 bilaterally  so he can carry his work bag using the right arm Baseline:  Goal status: IN PROGRESS 10/13  4.  Pt will to report decreased cervical pain radiating down his right arm during functional tasks, such as reaching overhead to place IV back. Baseline:  Goal status: PARTIALLY MET 10/13 - not down arm, but some shoulder pain  5.  Pt will increase UEFS score to at least 75% so he is able to return to gardening fruit trees Baseline: 65% Goal status: Met on 09/01/24  6.  Pt will decrease Sit to stand time to 12 sec or less so he is increases functional capacity for going hiking Baseline: 16 sec  Goal status: Met on 09/01/24  7.  Patient will increase right grip strength to at least 60 pounds to allow him to more easily 40# bags of wood pellets.  Baseline:  47 pounds  Goal status:  NEW on 09/12/2024   PLAN:  PT FREQUENCY: 1-2x/week  PT DURATION: 8 weeks  PLANNED INTERVENTIONS: [id number]- PT Re-evaluation, 97750- Physical Performance Testing, 97110-Therapeutic exercises, 97530- Therapeutic activity, [id number]- Neuromuscular re-education, 97535- Self Care, [id number]- Manual therapy, [id number]- Gait training, [phone]- Orthotic Initial, [phone]-  Canalith repositioning, [id number]- Aquatic Therapy, [phone]- Electrical stimulation (unattended), [phone]- Electrical stimulation (manual), [id number]- Vasopneumatic device, [id number]- Ultrasound, [id number]- Traction (mechanical), [id number]- Parrafin, [id number] (1-2 muscles), [id number] (3+ muscles)- Dry Needling, Patient/Family education, Balance training, Stair training, Taping, Joint mobilization, Joint manipulation, Spinal manipulation, Spinal mobilization, Vestibular training, Cryotherapy, and Moist heat  PLAN FOR NEXT SESSION: Progress OH strength as pt tolerates to allow for increased ease with OH reach at work; Cont cervical stretches and neck flexibility, shoulder mobility, nerve glides, isometrics, review supine scap series with theraband prn   Jarrell Laming, PT, DPT 10/10/24, 10:32 AM   [facility] [address], [address] [place], [place] [address] Phone # [phone] Fax [phone]

## 2024-10-16 NOTE — Therapy (Signed)
 OUTPATIENT PHYSICAL THERAPY TREATMENT NOTE   Patient Name: Terry Kim MRN: [id number] DOB:21-Dec-1960, 63 y.o., male Today's Date: 10/17/2024  END OF SESSION:  PT End of Session - 10/17/24 1534     Visit Number 15    Date for Recertification  11/11/24    Authorization Type [facility]    PT Start Time 1534    PT Stop Time 1613    PT Time Calculation (min) 39 min    Activity Tolerance Patient tolerated treatment well    Behavior During Therapy [facility] for tasks assessed/performed                 Past Medical History:  Diagnosis Date   Hypertension    Obesity    Persistent atrial fibrillation (HCC)    CHADS2VASC score is 1   Pure hypercholesterolemia    Supraventricular premature beats    Past Surgical History:  Procedure Laterality Date   KNEE ARTHROSCOPY WITH MEDIAL MENISECTOMY Left 07/05/2014   Procedure: LEFT ARTHROSCOPY KNEE WITH MEDIAL MENISECTOMY/DEBRIDEMENT/SHAVING (CHONDROPLASTY)/ARTHROSCOPY KNEE ABRASION ARTHROPLASTY/MULTIPLE DRILLING;  Surgeon: LELON JONETTA Shari Mickey., MD;  Location: [facility];  Service: Orthopedics;  Laterality: Left;   SHOULDER ARTHROSCOPY  2000   right   SHOULDER ARTHROSCOPY W/ ROTATOR CUFF REPAIR  2012   left   TONSILLECTOMY     Patient Active Problem List   Diagnosis Date Noted   Glucose intolerance 02/10/2023   OSA (obstructive sleep apnea) 08/30/2020   Supraventricular premature beats    Persistent atrial fibrillation (HCC)    Obesity    Pure hypercholesterolemia    Hypertension     PCP: Carlin Gull, MD   REFERRING PROVIDER: Dorn Ned, MD  REFERRING DIAG: Cervical radiculopathy on right side   THERAPY DIAG:  Cervicalgia  Radiculopathy, cervical region  Muscle weakness (generalized)  Abnormal posture  Cramp and spasm  Rationale for Evaluation and Treatment: Rehabilitation  ONSET DATE: June 24, 25  SUBJECTIVE:                                                                                                                                                                                                          SUBJECTIVE STATEMENT: I've had increased stiffness in the neck. I slacked off on my exercises the past few days, so that may be why. My back is hurting also.   PERTINENT HISTORY:  Right shoulder arthroscopy in 2000 Left shoulder arthroscopy with rotator cuff repair in 2012  PAIN:   Are you having pain? Yes NPRS scale: 3-4/10 Pain location:  cervical R PAIN TYPE: aching and dull Pain description: intermittent  Aggravating factors: lifting the couch in the house Relieving factors: stretching and rest    PRECAUTIONS: None  RED FLAGS: None     WEIGHT BEARING RESTRICTIONS: No  FALLS:  Has patient fallen in last 6 months? 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  DIAGNOSTIC FINDINGS:  Cervical MRI on 05/23/2024: IMPRESSION: 1. Cervical spondylosis and degenerative disc disease causing prominent foraminal stenosis at C3-4, C4-5, C5-6, and C6-7. Moderate right foraminal stenosis at C2-3.  PATIENT SURVEYS:  UEFS  Extreme difficulty/unable (0), Quite a bit of difficulty (1), Moderate difficulty (2), Little difficulty (3), No difficulty (4) Survey date:  07/27/24 09/01/24  Any of your usual work, household or school activities 2 3  2. Your usual hobbies, recreational/sport activities 2 2   3. Lifting a bag of groceries to  waist level 3 3   4. Lifting a bag of groceries above your head 1 2  5. Grooming your hair 3 3  6. Pushing up on your hands (I.e. from bathtub or chair) 2 2  7. Preparing food (I.e. peeling/cutting) 3 4  8. Driving  3 3  9. Vacuuming, sweeping, or raking 3 3  10. Dressing  4 4  11. Doing up buttons 3 4  12. Using tools/appliances 3 3  13. Opening doors 3 4  14. Cleaning  3 4  15. Tying or lacing shoes 3 4  16. Sleeping  2 3  17. Laundering clothes (I.e. washing, ironing, folding) 2 4  18. Opening a jar 2 4  19. Throwing a ball 3 3  20. Carrying a small suitcase with your affected limb.  2 3  Score total:  52/80 = 65% 65/80 = 81.25%            COGNITION: Overall cognitive status: Within functional limits for tasks assessed  SENSATION: Not tested  POSTURE: rounded shoulders and forward head  PALPATION: TTP right upper trap    CERVICAL ROM:   Active ROM A/ROM (deg) eval A/ROM 09/01/24  Flexion 30 deg  35  Extension 20 deg 35  Right lateral flexion 22 deg  25  Left lateral flexion 40 deg  25  Right rotation 58 deg  55  Left rotation 55 deg  60   (Blank rows = not tested)  UPPER EXTREMITY ROM:  Active ROM Right eval Right 09/12/24 Left eval  Shoulder flexion 140 160 170  Shoulder extension     Shoulder abduction 140 165 180  Shoulder adduction     Shoulder extension     Shoulder internal rotation 8 50 55  Shoulder external rotation 60 60 80  Elbow flexion     Elbow extension     Wrist flexion     Wrist extension     Wrist ulnar deviation     Wrist radial deviation     Wrist pronation  Wrist supination      (Blank rows = not tested)  UPPER EXTREMITY MMT:  MMT Right eval Left eval Right 10/13 Left  10/13  Shoulder flexion 3+ 4+ 4+ 5  Shoulder extension 4+ 4+ 5 5  Shoulder abduction 4 4+ 4 5  Shoulder adduction      Shoulder internal rotation 4 4 4+ 4+  Shoulder external rotation 4- 4+ 4+ 5   (Blank rows = not tested)  09/12/2024: Right  grip strength:  47 pounds Left grip strength:  75 pounds  10/10/2024: Right grip strength:  52 pounds Left grip strength: 78 pounds  CERVICAL SPECIAL TESTS:  Upper limb tension test (ULTT): Positive, Spurling's test: Positive, and Distraction test: Positive Bakody's Sign: positive Phalen's: positive Reverse phalen's: negative    FUNCTIONAL TESTS:  Eval: 5 times sit to stand: 16.40 sec   09/01/2024: 5 times sit to/from stand:  11.97 sec  TREATMENT DATE:  10/17/2024: UBE level 2.0 x3 min each direction with PT present to discuss status Standing shoulder ER with green tband 2x10 Standing shoulder horizontal abduction with green tband 2x10 Seated shoulder overhead press with 3# dumbbell 2x10 B Seated shoulder abduction and flexion with 3# dumbbell 2x10 Seated grip strengthening with tan flexbar twist x20 Seated tan flexbar bending x20 Standing lower trap lift off with green tband 2x10 Standing right shoulder IR towel stretch 2x30 sec Standing left shoulder ER towel stretch 2x30 sec  Standing open book with green tband 2x10 bilat Standing lat pulldown 35# 2 x10 (increase next time) Standing rows 15# 2x10 (increase next time) Standing right shoulder D2 flex and ext with green tband 2x10 Triceps 20# x 20 Matrix   10/10/2024: UBE level 2.0 x3 min each direction with PT present to discuss status Standing shoulder ER with red tband 2x10 Standing shoulder horizontal abduction with red tband 2x10 Seated shoulder overhead press with 3# dumbbell 2x10 B Seated shoulder abduction and flexion with 3# dumbbell 2x10 Seated grip strengthening with tan flexbar twist x20 Seated tan flexbar bending x20 Standing lower trap lift off with red tband 2x10 Standing right shoulder IR towel stretch 2x20 sec Standing left shoulder ER towel stretch 2x20 sec  Standing open book with red tband x10 bilat Quadruped reach under x10 bilat Standing lat pulldown 35# 2 x10 Standing rows 15# 2x10 Standing  right shoulder D2 with red tband 2x10   09/14/2024: UBE level 2 x 6 min alternating direction each minute with PT present to discuss status Seated shoulder overhead press with 2# dumbbell 2x10 B Seated shoulder abduction and flexion with 3# dumbbell 2x10 Cabinet reaches 3# x 10  ea R fwd and scaption Standing lat pulldown 35# 2 x10 Standing single arm row with 10# 2x10 Bilat Standing single arm shoulder chops 10# 2x10 bilat Standing unilateral horizontal ABD green 2x 10 B - challenging Standing ER green 2x10 R Wall walks light green band 2x10 3 way reach light green band x 10 Seated grip strengthening with tan flexbar Digiflex 5# R hand  09/12/2024: UBE level 2 x 6 min alternating direction each minute with PT present to discuss status Supine shoulder A/ROM measurements Supine shoulder flexion with 3# dumbbell x15 right UE Left sidelying right shoulder ER with 3# dumbbell 2x10 (with 3 sec hold) Seated shoulder overhead press with 2# dumbbell 2x7 on right side, 2x10 on left Seated shoulder abduction with 2# dumbbell  Grip strength Seated grip strength with red medium putty x1 min Seated grip strengthening with red flexbar Standing lat  pulldown 35# 2 x10 Standing rows with 15# 2x10    PATIENT EDUCATION:  HEP update Person educated: Patient Education method: Explanation, Demonstration, Tactile cues, Verbal cues, and Handouts Education comprehension: verbalized understanding, returned demonstration, verbal cues required, tactile cues required, and needs further education  HOME EXERCISE PROGRAM: Access Code: [facility] URL: [email or web address] Date: 10/17/2024 Prepared by: Mliss  Exercises - Isometric Shoulder Flexion at Wall  - 1 x daily - 7 x weekly - 2 sets - 10 reps - Isometric Shoulder Extension at Wall  - 1 x daily - 7 x weekly - 2 sets - 10 reps - Isometric Shoulder Abduction at Wall  - 1 x daily - 7 x weekly - 2 sets - 10 reps - Standing Isometric  Shoulder Internal Rotation at Doorway  - 1 x daily - 7 x weekly - 2 sets - 10 reps - Standing Isometric Shoulder External Rotation with Doorway  - 1 x daily - 7 x weekly - 2 sets - 10 reps - Seated Scapular Retraction  - 1 x daily - 7 x weekly - 2 sets - 10 reps - Standing Radial Nerve Glide (Mirrored)  - 2 x daily - 7 x weekly - 1 sets - 10 reps - 5 sec on/5 sec off hold - Median Nerve Flossing (Mirrored)  - 1 x daily - 3 x weekly - 2 sets - 10 reps - Seated Cervical Sidebending Stretch  - 2 x daily - 7 x weekly - 1 sets - 3 reps - 30 sec hold - Seated Levator Scapulae Stretch  - 2 x daily - 7 x weekly - 1 sets - 3 reps - 30 sec hold - Forearm Walks on Wall with Resistance Band  - 1 x daily - 7 x weekly - 1-2 sets - 3-5 reps - Wall Clock with Theraband (Mirrored)  - 1 x daily - 3 x weekly - 1-3 sets - 10 reps - Self-Epley Maneuver Left Ear  - 1 x daily - 7 x weekly - 1-2 reps - Standing Shoulder Flexion with Resistance  - 1 x daily - 3 x weekly - 2 sets - 10 reps - Standing Single Arm Shoulder Abduction with Resistance  - 1 x daily - 3 x weekly - 2 sets - 10 reps - Standing Shoulder Internal Rotation with Anchored Resistance  - 1 x daily - 3 x weekly - 2 sets - 10 reps - Standing Thoracic Open Book at Wall  - 1 x daily - 3 x weekly - 2 sets - 10 reps  ASSESSMENT:  CLINICAL IMPRESSION:  Patient demonstrates much improved form with most of his exercises. He had some increased soreness in the neck he thinks due to not doing his stretches for the past few days. He reports decreased tightness at end of session today. We discussed ways to increase grip strength including KB carries and hanging from a bar. Patient is doing very well and should be ready for discharge at next visit.  OBJECTIVE IMPAIRMENTS: decreased activity tolerance, decreased balance, decreased coordination, decreased endurance, decreased mobility, decreased ROM, decreased strength, hypomobility, increased fascial restrictions,  increased muscle spasms, impaired flexibility, impaired sensation, impaired UE functional use, improper body mechanics, postural dysfunction, and pain.   ACTIVITY LIMITATIONS: carrying, lifting, bending, sitting, standing, sleeping, transfers, bathing, dressing, and reach over head  PARTICIPATION LIMITATIONS: meal prep, cleaning, driving, community activity, and occupation  PERSONAL FACTORS: Age, Behavior pattern, Education, Past/current experiences, Profession, and 3+ comorbidities: Hx of bilateral shoulder arthroscopy, knee  arthroscopy, A-Fib are also affecting patient's functional outcome.   REHAB POTENTIAL: Good  CLINICAL DECISION MAKING: Evolving/moderate complexity  EVALUATION COMPLEXITY: Moderate   GOALS: Goals reviewed with patient? Yes  SHORT TERM GOALS: Target date: 08/26/24  Pt will demonstrate competency with initial HEP so he is able to complete household tasks independently Baseline:  Goal status: Met on 08/23/2024  2.  Pt will demonstrate self care strategies at home so he can improve independent habits Baseline:  Goal status: Met on 08/23/2024     LONG TERM GOALS: Target date: 11/11/2024  Pt will demonstrate competency with advanced HEP to allow for self progression after discharge. Baseline:  Goal status: Ongoing  2.  Pt will increase cervical ROM to [facility] to allow him to flex neck to read charts at work Baseline: 30 deg Goal status: Ongoing (see above)  3.  Pt will increase shoulder strength to 4+ to 5/5 bilaterally  so he can carry his work bag using the right arm Baseline:  Goal status: IN PROGRESS 10/13  4.  Pt will to report decreased cervical pain radiating down his right arm during functional tasks, such as reaching overhead to place IV back. Baseline:  Goal status: PARTIALLY MET 10/13 - not down arm, but some shoulder pain  5.  Pt will increase UEFS score to at least 75% so he is able to return to gardening fruit trees Baseline: 65% Goal status:  Met on 09/01/24  6.  Pt will decrease Sit to stand time to 12 sec or less so he is increases functional capacity for going hiking Baseline: 16 sec  Goal status: Met on 09/01/24  7.  Patient will increase right grip strength to at least 60 pounds to allow him to more easily 40# bags of wood pellets.  Baseline:  47 pounds  Goal status:  NEW on 09/12/2024   PLAN:  PT FREQUENCY: 1-2x/week  PT DURATION: 8 weeks  PLANNED INTERVENTIONS: 97164- PT Re-evaluation, 97750- Physical Performance Testing, 97110-Therapeutic exercises, 97530- Therapeutic activity, [id number]- Neuromuscular re-education, 97535- Self Care, [id number]- Manual therapy, [id number]- Gait training, [phone]- Orthotic Initial, [phone]- Canalith repositioning, [id number]- Aquatic Therapy, [phone]- Electrical stimulation (unattended), [phone]- Electrical stimulation (manual), [id number]- Vasopneumatic device, [id number]- Ultrasound, [id number]- Traction (mechanical), [id number]- Parrafin, [id number] (1-2 muscles), [id number] (3+ muscles)- Dry Needling, Patient/Family education, Balance training, Stair training, Taping, Joint mobilization, Joint manipulation, Spinal manipulation, Spinal mobilization, Vestibular training, Cryotherapy, and Moist heat  PLAN FOR NEXT SESSION: Progress OH strength as pt tolerates to allow for increased ease with OH reach at work; Cont cervical stretches and neck flexibility, shoulder mobility, nerve glides, isometrics, review supine scap series with theraband prn  Mliss Cummins, PT 10/17/24, 4:15 PM   [facility] [address], [address] [place], [place] [address] Phone # [phone] Fax [phone]

## 2024-10-17 ENCOUNTER — Encounter: Payer: Self-pay | Admitting: Physical Therapy

## 2024-10-17 ENCOUNTER — Ambulatory Visit: Attending: Neurosurgery | Admitting: Physical Therapy

## 2024-10-17 DIAGNOSIS — M6281 Muscle weakness (generalized): Secondary | ICD-10-CM | POA: Diagnosis present

## 2024-10-17 DIAGNOSIS — M5412 Radiculopathy, cervical region: Secondary | ICD-10-CM | POA: Diagnosis present

## 2024-10-17 DIAGNOSIS — M542 Cervicalgia: Secondary | ICD-10-CM | POA: Insufficient documentation

## 2024-10-17 DIAGNOSIS — R293 Abnormal posture: Secondary | ICD-10-CM | POA: Diagnosis present

## 2024-10-17 DIAGNOSIS — R252 Cramp and spasm: Secondary | ICD-10-CM | POA: Insufficient documentation

## 2024-10-24 ENCOUNTER — Ambulatory Visit: Admitting: Physical Therapy

## 2024-10-24 ENCOUNTER — Encounter: Payer: Self-pay | Admitting: Physical Therapy

## 2024-10-24 DIAGNOSIS — M542 Cervicalgia: Secondary | ICD-10-CM

## 2024-10-24 DIAGNOSIS — R252 Cramp and spasm: Secondary | ICD-10-CM

## 2024-10-24 DIAGNOSIS — M5412 Radiculopathy, cervical region: Secondary | ICD-10-CM

## 2024-10-24 DIAGNOSIS — R293 Abnormal posture: Secondary | ICD-10-CM

## 2024-10-24 DIAGNOSIS — M6281 Muscle weakness (generalized): Secondary | ICD-10-CM

## 2024-10-24 NOTE — Therapy (Signed)
 OUTPATIENT PHYSICAL THERAPY TREATMENT NOTE / DISCHARGE NOTE  Patient Name: Terry Kim MRN: [id number] DOB:1961-07-17, 63 y.o., male Today's Date: 10/24/2024  END OF SESSION:  PT End of Session - 10/24/24 1647     Visit Number 16    Date for Recertification  11/11/24    Authorization Type [facility]    PT Start Time [id number]    PT Stop Time 1642    PT Time Calculation (min) 25 min    Activity Tolerance Patient tolerated treatment well    Behavior During Therapy [facility] for tasks assessed/performed                  Past Medical History:  Diagnosis Date   Hypertension    Obesity    Persistent atrial fibrillation (HCC)    CHADS2VASC score is 1   Pure hypercholesterolemia    Supraventricular premature beats    Past Surgical History:  Procedure Laterality Date   KNEE ARTHROSCOPY WITH MEDIAL MENISECTOMY Left 07/05/2014   Procedure: LEFT ARTHROSCOPY KNEE WITH MEDIAL MENISECTOMY/DEBRIDEMENT/SHAVING (CHONDROPLASTY)/ARTHROSCOPY KNEE ABRASION ARTHROPLASTY/MULTIPLE DRILLING;  Surgeon: LELON JONETTA Shari Mickey., MD;  Location: [facility];  Service: Orthopedics;  Laterality: Left;   SHOULDER ARTHROSCOPY  2000   right   SHOULDER ARTHROSCOPY W/ ROTATOR CUFF REPAIR  2012   left   TONSILLECTOMY     Patient Active Problem List   Diagnosis Date Noted   Glucose intolerance 02/10/2023   OSA (obstructive sleep apnea) 08/30/2020   Supraventricular premature beats    Persistent atrial fibrillation (HCC)    Obesity    Pure hypercholesterolemia    Hypertension     PCP: Carlin Gull, MD   REFERRING PROVIDER: Dorn Ned, MD  REFERRING DIAG: Cervical radiculopathy on right side   THERAPY DIAG:  Cervicalgia  Radiculopathy, cervical region  Muscle weakness (generalized)  Abnormal posture  Cramp and spasm  Rationale for Evaluation and Treatment: Rehabilitation  ONSET DATE: June 24, 25  SUBJECTIVE:                                                                                                                                                                                                          SUBJECTIVE STATEMENT: Patient reports he is a little tired today after doing planning. He has a little stiffness / soreness in his neck currently.   PERTINENT HISTORY:  Right shoulder arthroscopy in 2000 Left shoulder arthroscopy with rotator cuff repair in 2012  PAIN:   Are you having pain? Yes NPRS scale: 3-4/10 Pain location: cervical R  PAIN TYPE: aching and dull Pain description: intermittent  Aggravating factors: lifting the couch in the house Relieving factors: stretching and rest    PRECAUTIONS: None  RED FLAGS: None     WEIGHT BEARING RESTRICTIONS: No  FALLS:  Has patient fallen in last 6 months? 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  DIAGNOSTIC FINDINGS:  Cervical MRI on 05/23/2024: IMPRESSION: 1. Cervical spondylosis and degenerative disc disease causing prominent foraminal stenosis at C3-4, C4-5, C5-6, and C6-7. Moderate right foraminal stenosis at C2-3.  PATIENT SURVEYS:  UEFS  Extreme difficulty/unable (0), Quite a bit of difficulty (1), Moderate difficulty (2), Little difficulty (3), No difficulty (4) Survey date:  07/27/24 09/01/24  Any of your usual work, household or school activities 2 3  2. Your usual hobbies, recreational/sport activities 2 2   3. Lifting a bag of  groceries to waist level 3 3   4. Lifting a bag of groceries above your head 1 2  5. Grooming your hair 3 3  6. Pushing up on your hands (I.e. from bathtub or chair) 2 2  7. Preparing food (I.e. peeling/cutting) 3 4  8. Driving  3 3  9. Vacuuming, sweeping, or raking 3 3  10. Dressing  4 4  11. Doing up buttons 3 4  12. Using tools/appliances 3 3  13. Opening doors 3 4  14. Cleaning  3 4  15. Tying or lacing shoes 3 4  16. Sleeping  2 3  17. Laundering clothes (I.e. washing, ironing, folding) 2 4  18. Opening a jar 2 4  19. Throwing a ball 3 3  20. Carrying a small suitcase with your affected limb.  2 3  Score total:  52/80 = 65% 65/80 = 81.25%            COGNITION: Overall cognitive status: Within functional limits for tasks assessed  SENSATION: Not tested  POSTURE: rounded shoulders and forward head  PALPATION: TTP right upper trap    CERVICAL ROM:   Active ROM A/ROM (deg) eval A/ROM 09/01/24 A/ROM 10/24/2024  Flexion 30 deg  35 40  Extension 20 deg 35 35  Right lateral flexion 22 deg  25 34  Left lateral flexion 40 deg  25 30  Right rotation 58 deg  55 80  Left rotation 55 deg  60 70   (Blank rows = not tested)  UPPER EXTREMITY ROM:  Active ROM Right eval Right 09/12/24 Left eval  Shoulder flexion 140 160 170  Shoulder extension     Shoulder abduction 140 165 180  Shoulder adduction     Shoulder extension     Shoulder internal rotation 8 50 55  Shoulder external rotation 60 60 80  Elbow flexion     Elbow extension     Wrist flexion     Wrist extension     Wrist ulnar deviation     Wrist radial deviation  Wrist pronation     Wrist supination      (Blank rows = not tested)  UPPER EXTREMITY MMT:  MMT Right eval Left eval Right 10/13 Left  10/13  Shoulder flexion 3+ 4+ 4+ 5  Shoulder extension 4+ 4+ 5 5  Shoulder abduction 4 4+ 4 5  Shoulder adduction      Shoulder internal rotation 4 4 4+ 4+  Shoulder external rotation 4- 4+ 4+ 5    (Blank rows = not tested)  09/12/2024: Right grip strength:  47 pounds Left grip strength:  75 pounds  10/10/2024: Right grip strength:  52 pounds Left grip strength: 78 pounds  10/24/2024: Right grip strength:  55 lbs Left grip strength: 80 lbs  CERVICAL SPECIAL TESTS:  Upper limb tension test (ULTT): Positive, Spurling's test: Positive, and Distraction test: Positive Bakody's Sign: positive Phalen's: positive Reverse phalen's: negative    FUNCTIONAL TESTS:  Eval: 5 times sit to stand: 16.40 sec   09/01/2024: 5 times sit to/from stand:  11.97 sec  TREATMENT DATE:  10/24/2024: UBE level 2.0 x3 min each direction with PT present to discuss status See cervical ROM assessment above Review and update of HEP and goals    10/17/2024: UBE level 2.0 x3 min each direction with PT present to discuss status Standing shoulder ER with green tband 2x10 Standing shoulder horizontal abduction with green tband 2x10 Seated shoulder overhead press with 3# dumbbell 2x10 B Seated shoulder abduction and flexion with 3# dumbbell 2x10 Seated grip strengthening with tan flexbar twist x20 Seated tan flexbar bending x20 Standing lower trap lift off with green tband 2x10 Standing right shoulder IR towel stretch 2x30 sec Standing left shoulder ER towel stretch 2x30 sec  Standing open book with green tband 2x10 bilat Standing lat pulldown 35# 2 x10 (increase next time) Standing rows 15# 2x10 (increase next time) Standing right shoulder D2 flex and ext with green tband 2x10 Triceps 20# x 20 Matrix   10/10/2024: UBE level 2.0 x3 min each direction with PT present to discuss status Standing shoulder ER with red tband 2x10 Standing shoulder horizontal abduction with red tband 2x10 Seated shoulder overhead press with 3# dumbbell 2x10 B Seated shoulder abduction and flexion with 3# dumbbell 2x10 Seated grip strengthening with tan flexbar twist x20 Seated tan flexbar bending x20 Standing lower  trap lift off with red tband 2x10 Standing right shoulder IR towel stretch 2x20 sec Standing left shoulder ER towel stretch 2x20 sec  Standing open book with red tband x10 bilat Quadruped reach under x10 bilat Standing lat pulldown 35# 2 x10 Standing rows 15# 2x10 Standing right shoulder D2 with red tband 2x10   09/14/2024: UBE level 2 x 6 min alternating direction each minute with PT present to discuss status Seated shoulder overhead press with 2# dumbbell 2x10 B Seated shoulder abduction and flexion with 3# dumbbell 2x10 Cabinet reaches 3# x 10  ea R fwd and scaption Standing lat pulldown 35# 2 x10 Standing single arm row with 10# 2x10 Bilat Standing single arm shoulder chops 10# 2x10 bilat Standing unilateral horizontal ABD green 2x 10 B - challenging Standing ER green 2x10 R Wall walks light green band 2x10 3 way reach light green band x 10 Seated grip strengthening with tan flexbar Digiflex 5# R hand    PATIENT EDUCATION:  HEP update Person educated: Patient Education method: Explanation, Demonstration, Tactile cues, Verbal cues, and Handouts Education comprehension: verbalized understanding, returned demonstration, verbal cues required, tactile cues required, and needs further  education  HOME EXERCISE PROGRAM: Access Code: [facility] URL: [email or web address] Date: 10/17/2024 Prepared by: Mliss  Exercises - Isometric Shoulder Flexion at Wall  - 1 x daily - 7 x weekly - 2 sets - 10 reps - Isometric Shoulder Extension at Wall  - 1 x daily - 7 x weekly - 2 sets - 10 reps - Isometric Shoulder Abduction at Wall  - 1 x daily - 7 x weekly - 2 sets - 10 reps - Standing Isometric Shoulder Internal Rotation at Doorway  - 1 x daily - 7 x weekly - 2 sets - 10 reps - Standing Isometric Shoulder External Rotation with Doorway  - 1 x daily - 7 x weekly - 2 sets - 10 reps - Seated Scapular Retraction  - 1 x daily - 7 x weekly - 2 sets - 10 reps - Standing Radial Nerve  Glide (Mirrored)  - 2 x daily - 7 x weekly - 1 sets - 10 reps - 5 sec on/5 sec off hold - Median Nerve Flossing (Mirrored)  - 1 x daily - 3 x weekly - 2 sets - 10 reps - Seated Cervical Sidebending Stretch  - 2 x daily - 7 x weekly - 1 sets - 3 reps - 30 sec hold - Seated Levator Scapulae Stretch  - 2 x daily - 7 x weekly - 1 sets - 3 reps - 30 sec hold - Forearm Walks on Wall with Resistance Band  - 1 x daily - 7 x weekly - 1-2 sets - 3-5 reps - Wall Clock with Theraband (Mirrored)  - 1 x daily - 3 x weekly - 1-3 sets - 10 reps - Self-Epley Maneuver Left Ear  - 1 x daily - 7 x weekly - 1-2 reps - Standing Shoulder Flexion with Resistance  - 1 x daily - 3 x weekly - 2 sets - 10 reps - Standing Single Arm Shoulder Abduction with Resistance  - 1 x daily - 3 x weekly - 2 sets - 10 reps - Standing Shoulder Internal Rotation with Anchored Resistance  - 1 x daily - 3 x weekly - 2 sets - 10 reps - Standing Thoracic Open Book at Wall  - 1 x daily - 3 x weekly - 2 sets - 10 reps  ASSESSMENT:  CLINICAL IMPRESSION: Discharge completed today. Jamerion has made great improvements in his cervical ORM and strength with physical therapy. Reviewed HEP and updated it as needed. Educated patient on the benefit of staying compliant with HEP with maintain strength and ROM improvements. All goals met. Patient to discharge home with HEP.  OBJECTIVE IMPAIRMENTS: decreased activity tolerance, decreased balance, decreased coordination, decreased endurance, decreased mobility, decreased ROM, decreased strength, hypomobility, increased fascial restrictions, increased muscle spasms, impaired flexibility, impaired sensation, impaired UE functional use, improper body mechanics, postural dysfunction, and pain.   ACTIVITY LIMITATIONS: carrying, lifting, bending, sitting, standing, sleeping, transfers, bathing, dressing, and reach over head  PARTICIPATION LIMITATIONS: meal prep, cleaning, driving, community activity, and  occupation  PERSONAL FACTORS: Age, Behavior pattern, Education, Past/current experiences, Profession, and 3+ comorbidities: Hx of bilateral shoulder arthroscopy, knee arthroscopy, A-Fib are also affecting patient's functional outcome.   REHAB POTENTIAL: Good  CLINICAL DECISION MAKING: Evolving/moderate complexity  EVALUATION COMPLEXITY: Moderate   GOALS: Goals reviewed with patient? Yes  SHORT TERM GOALS: Target date: 08/26/24  Pt will demonstrate competency with initial HEP so he is able to complete household tasks independently Baseline:  Goal status: Met on 08/23/2024  2.  Pt  will demonstrate self care strategies at home so he can improve independent habits Baseline:  Goal status: Met on 08/23/2024     LONG TERM GOALS: Target date: 11/11/2024  Pt will demonstrate competency with advanced HEP to allow for self progression after discharge. Baseline:  Goal status: MET 10/24/2024  2.  Pt will increase cervical ROM to [facility] to allow him to flex neck to read charts at work Baseline: 30 deg Goal status: MET 10/24/2024  3.  Pt will increase shoulder strength to 4+ to 5/5 bilaterally  so he can carry his work bag using the right arm Baseline:  Goal status:MET 10/24/2024  4.  Pt will to report decreased cervical pain radiating down his right arm during functional tasks, such as reaching overhead to place IV back. Baseline:  Goal status: MET 10/24/2024  5.  Pt will increase UEFS score to at least 75% so he is able to return to gardening fruit trees Baseline: 65% Goal status: Met on 09/01/24  6.  Pt will decrease Sit to stand time to 12 sec or less so he is increases functional capacity for going hiking Baseline: 16 sec  Goal status: Met on 09/01/24  7.  Patient will increase right grip strength to at least 60 pounds to allow him to more easily 40# bags of wood pellets.  Baseline:  47 pounds  Goal status:  NOT MET 10/24/2024   PLAN:  PT FREQUENCY: 1-2x/week  PT DURATION: 8  weeks  PLANNED INTERVENTIONS: 97164- PT Re-evaluation, 97750- Physical Performance Testing, 97110-Therapeutic exercises, 97530- Therapeutic activity, 97112- Neuromuscular re-education, 97535- Self Care, [id number]- Manual therapy, [id number]- Gait training, [id number]- Orthotic Initial, [phone]- Canalith repositioning, [id number]- Aquatic Therapy, [id number]- Electrical stimulation (unattended), [phone]- Electrical stimulation (manual), [id number]- Vasopneumatic device, [id number]- Ultrasound, [id number]- Traction (mechanical), [id number]- Parrafin, [id number] (1-2 muscles), [id number] (3+ muscles)- Dry Needling, Patient/Family education, Balance training, Stair training, Taping, Joint mobilization, Joint manipulation, Spinal manipulation, Spinal mobilization, Vestibular training, Cryotherapy, and Moist heat  PLAN FOR NEXT SESSION: Progress OH strength as pt tolerates to allow for increased ease with OH reach at work; Cont cervical stretches and neck flexibility, shoulder mobility, nerve glides, isometrics, review supine scap series with theraband prn  Mliss Cummins, PT 10/24/24, 4:48 PM   [facility] [address], [address] [place], [place] [address] Phone # [phone] Fax [phone]   PHYSICAL THERAPY DISCHARGE SUMMARY  Visits from Start of Care: 16  Current functional level related to goals / functional outcomes: See above   Remaining deficits: none   Education / Equipment: See above   Patient agrees to discharge. Patient goals were partially met. Patient is being discharged due to being pleased with the current functional level.

## 2024-10-27 ENCOUNTER — Other Ambulatory Visit: Payer: Self-pay

## 2024-10-27 ENCOUNTER — Other Ambulatory Visit (HOSPITAL_BASED_OUTPATIENT_CLINIC_OR_DEPARTMENT_OTHER): Payer: Self-pay

## 2024-10-27 DIAGNOSIS — Z125 Encounter for screening for malignant neoplasm of prostate: Secondary | ICD-10-CM | POA: Diagnosis not present

## 2024-10-27 DIAGNOSIS — R7303 Prediabetes: Secondary | ICD-10-CM | POA: Diagnosis not present

## 2024-10-27 DIAGNOSIS — Z0000 Encounter for general adult medical examination without abnormal findings: Secondary | ICD-10-CM | POA: Diagnosis not present

## 2024-10-27 DIAGNOSIS — Z6839 Body mass index (BMI) 39.0-39.9, adult: Secondary | ICD-10-CM | POA: Diagnosis not present

## 2024-10-27 DIAGNOSIS — M791 Myalgia, unspecified site: Secondary | ICD-10-CM | POA: Diagnosis not present

## 2024-10-27 DIAGNOSIS — J45909 Unspecified asthma, uncomplicated: Secondary | ICD-10-CM | POA: Diagnosis not present

## 2024-10-27 DIAGNOSIS — E7800 Pure hypercholesterolemia, unspecified: Secondary | ICD-10-CM | POA: Diagnosis not present

## 2024-10-27 DIAGNOSIS — R42 Dizziness and giddiness: Secondary | ICD-10-CM | POA: Diagnosis not present

## 2024-10-27 DIAGNOSIS — Z13220 Encounter for screening for lipoid disorders: Secondary | ICD-10-CM | POA: Diagnosis not present

## 2024-10-27 DIAGNOSIS — M179 Osteoarthritis of knee, unspecified: Secondary | ICD-10-CM | POA: Diagnosis not present

## 2024-10-27 MED ORDER — MONTELUKAST SODIUM 10 MG PO TABS
10.0000 mg | ORAL_TABLET | Freq: Every day | ORAL | 4 refills | Status: AC | PRN
  Filled 2024-10-27: qty 90, 90d supply, fill #0

## 2024-10-27 MED ORDER — FLUTICASONE-SALMETEROL 250-50 MCG/ACT IN AEPB
1.0000 | INHALATION_SPRAY | Freq: Two times a day (BID) | RESPIRATORY_TRACT | 4 refills | Status: AC
  Filled 2024-10-27: qty 180, 90d supply, fill #0

## 2024-10-27 MED ORDER — JARDIANCE 25 MG PO TABS
25.0000 mg | ORAL_TABLET | Freq: Every day | ORAL | 4 refills | Status: AC
  Filled 2024-10-27 (×3): qty 90, 90d supply, fill #0

## 2024-10-27 MED ORDER — MECLIZINE HCL 25 MG PO TABS
12.5000 mg | ORAL_TABLET | Freq: Two times a day (BID) | ORAL | 1 refills | Status: AC | PRN
  Filled 2024-10-27: qty 30, 30d supply, fill #0

## 2024-10-27 MED ORDER — ALBUTEROL SULFATE HFA 108 (90 BASE) MCG/ACT IN AERS
2.0000 | INHALATION_SPRAY | Freq: Four times a day (QID) | RESPIRATORY_TRACT | 4 refills | Status: AC | PRN
  Filled 2024-10-27: qty 6.7, 25d supply, fill #0

## 2024-10-27 MED ORDER — METHOCARBAMOL 500 MG PO TABS
500.0000 mg | ORAL_TABLET | Freq: Four times a day (QID) | ORAL | 3 refills | Status: AC | PRN
  Filled 2024-10-27: qty 120, 30d supply, fill #0

## 2024-10-28 ENCOUNTER — Other Ambulatory Visit: Payer: Self-pay

## 2024-11-02 ENCOUNTER — Other Ambulatory Visit: Payer: Self-pay

## 2024-11-03 ENCOUNTER — Other Ambulatory Visit: Payer: Self-pay

## 2024-11-14 ENCOUNTER — Other Ambulatory Visit (HOSPITAL_BASED_OUTPATIENT_CLINIC_OR_DEPARTMENT_OTHER): Payer: Self-pay

## 2024-11-14 MED ORDER — GABAPENTIN 100 MG PO CAPS
100.0000 mg | ORAL_CAPSULE | Freq: Three times a day (TID) | ORAL | 2 refills | Status: AC
  Filled 2024-11-14: qty 90, 30d supply, fill #0

## 2024-11-14 MED FILL — Metformin HCl Tab 500 MG: ORAL | 30 days supply | Qty: 60 | Fill #3 | Status: AC

## 2024-11-30 ENCOUNTER — Other Ambulatory Visit (HOSPITAL_BASED_OUTPATIENT_CLINIC_OR_DEPARTMENT_OTHER): Payer: Self-pay

## 2024-11-30 ENCOUNTER — Other Ambulatory Visit (HOSPITAL_COMMUNITY): Payer: Self-pay
